# Patient Record
Sex: Female | Born: 2000
Health system: Southern US, Community
[De-identification: ages and names within clinical notes are randomized; demographics above are authoritative.]

## PROBLEM LIST (undated history)

## (undated) ENCOUNTER — Ambulatory Visit: Disposition: A | Discharge status: Home / Self Care | Attending: Physician Assistant | Admitting: Physician Assistant

## (undated) DIAGNOSIS — T7840XA Allergy, unspecified, initial encounter: Secondary | ICD-10-CM

## (undated) DIAGNOSIS — K219 Gastro-esophageal reflux disease without esophagitis: Secondary | ICD-10-CM

## (undated) DIAGNOSIS — F419 Anxiety disorder, unspecified: Secondary | ICD-10-CM

## (undated) DIAGNOSIS — F32A Depression, unspecified: Secondary | ICD-10-CM

## (undated) DIAGNOSIS — R569 Unspecified convulsions: Secondary | ICD-10-CM

## (undated) HISTORY — DX: Anxiety disorder, unspecified: F41.9

## (undated) HISTORY — DX: Gastro-esophageal reflux disease without esophagitis: K21.9

## (undated) HISTORY — DX: Depression, unspecified: F32.A

## (undated) HISTORY — DX: Allergy, unspecified, initial encounter: T78.40XA

---
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
## 2020-01-01 DIAGNOSIS — Z113 Encounter for screening for infections with a predominantly sexual mode of transmission: Secondary | ICD-10-CM | POA: Diagnosis not present

## 2020-01-01 DIAGNOSIS — R300 Dysuria: Secondary | ICD-10-CM | POA: Diagnosis not present

## 2020-01-01 DIAGNOSIS — R102 Pelvic and perineal pain: Secondary | ICD-10-CM | POA: Diagnosis not present

## 2020-01-24 DIAGNOSIS — R300 Dysuria: Secondary | ICD-10-CM | POA: Diagnosis not present

## 2020-01-24 DIAGNOSIS — R102 Pelvic and perineal pain: Secondary | ICD-10-CM | POA: Diagnosis not present

## 2020-02-07 DIAGNOSIS — N390 Urinary tract infection, site not specified: Secondary | ICD-10-CM | POA: Diagnosis not present

## 2020-02-07 DIAGNOSIS — B951 Streptococcus, group B, as the cause of diseases classified elsewhere: Secondary | ICD-10-CM | POA: Diagnosis not present

## 2020-02-15 DIAGNOSIS — F419 Anxiety disorder, unspecified: Secondary | ICD-10-CM | POA: Diagnosis not present

## 2020-02-15 DIAGNOSIS — M6281 Muscle weakness (generalized): Secondary | ICD-10-CM | POA: Diagnosis not present

## 2020-02-15 DIAGNOSIS — R300 Dysuria: Secondary | ICD-10-CM | POA: Diagnosis not present

## 2020-02-15 DIAGNOSIS — R102 Pelvic and perineal pain: Secondary | ICD-10-CM | POA: Diagnosis not present

## 2020-03-06 DIAGNOSIS — R102 Pelvic and perineal pain: Secondary | ICD-10-CM | POA: Diagnosis not present

## 2020-03-06 DIAGNOSIS — M6281 Muscle weakness (generalized): Secondary | ICD-10-CM | POA: Diagnosis not present

## 2020-03-06 DIAGNOSIS — M62838 Other muscle spasm: Secondary | ICD-10-CM | POA: Diagnosis not present

## 2020-03-06 DIAGNOSIS — R300 Dysuria: Secondary | ICD-10-CM | POA: Diagnosis not present

## 2020-03-08 DIAGNOSIS — R300 Dysuria: Secondary | ICD-10-CM | POA: Diagnosis not present

## 2020-03-08 DIAGNOSIS — R102 Pelvic and perineal pain: Secondary | ICD-10-CM | POA: Diagnosis not present

## 2020-03-26 DIAGNOSIS — F321 Major depressive disorder, single episode, moderate: Secondary | ICD-10-CM | POA: Diagnosis not present

## 2020-03-26 DIAGNOSIS — F4011 Social phobia, generalized: Secondary | ICD-10-CM | POA: Diagnosis not present

## 2020-03-27 DIAGNOSIS — R300 Dysuria: Secondary | ICD-10-CM | POA: Diagnosis not present

## 2020-03-27 DIAGNOSIS — M6281 Muscle weakness (generalized): Secondary | ICD-10-CM | POA: Diagnosis not present

## 2020-03-27 DIAGNOSIS — R102 Pelvic and perineal pain: Secondary | ICD-10-CM | POA: Diagnosis not present

## 2020-04-20 ENCOUNTER — Other Ambulatory Visit (INDEPENDENT_AMBULATORY_CARE_PROVIDER_SITE_OTHER): Payer: Self-pay | Admitting: Family

## 2020-04-20 DIAGNOSIS — H60391 Other infective otitis externa, right ear: Secondary | ICD-10-CM | POA: Diagnosis not present

## 2020-04-20 DIAGNOSIS — G40B09 Juvenile myoclonic epilepsy, not intractable, without status epilepticus: Secondary | ICD-10-CM

## 2020-04-26 DIAGNOSIS — H60501 Unspecified acute noninfective otitis externa, right ear: Secondary | ICD-10-CM | POA: Diagnosis not present

## 2020-04-26 DIAGNOSIS — H6121 Impacted cerumen, right ear: Secondary | ICD-10-CM | POA: Diagnosis not present

## 2020-05-01 DIAGNOSIS — R300 Dysuria: Secondary | ICD-10-CM | POA: Diagnosis not present

## 2020-05-01 DIAGNOSIS — R102 Pelvic and perineal pain: Secondary | ICD-10-CM | POA: Diagnosis not present

## 2020-05-01 DIAGNOSIS — M6281 Muscle weakness (generalized): Secondary | ICD-10-CM | POA: Diagnosis not present

## 2020-05-07 DIAGNOSIS — F17210 Nicotine dependence, cigarettes, uncomplicated: Secondary | ICD-10-CM | POA: Diagnosis not present

## 2020-05-07 DIAGNOSIS — Z01419 Encounter for gynecological examination (general) (routine) without abnormal findings: Secondary | ICD-10-CM | POA: Diagnosis not present

## 2020-05-07 DIAGNOSIS — Z6828 Body mass index (BMI) 28.0-28.9, adult: Secondary | ICD-10-CM | POA: Diagnosis not present

## 2020-05-07 DIAGNOSIS — Z113 Encounter for screening for infections with a predominantly sexual mode of transmission: Secondary | ICD-10-CM | POA: Diagnosis not present

## 2020-05-07 DIAGNOSIS — Z309 Encounter for contraceptive management, unspecified: Secondary | ICD-10-CM | POA: Diagnosis not present

## 2020-05-16 ENCOUNTER — Telehealth (INDEPENDENT_AMBULATORY_CARE_PROVIDER_SITE_OTHER): Payer: Self-pay | Admitting: Family

## 2020-05-16 NOTE — Telephone Encounter (Signed)
I left a message for Mom that Alison Brock should get the Covid vaccine and that it will not interfere with her medication. I invited her to call back if she has more questions. TG

## 2020-05-16 NOTE — Telephone Encounter (Signed)
  Who's calling (name and relationship to patient) : Elease Hashimoto ( mom)  [phone]   Provider they see: Elveria Rising  Reason for call: Mom wants advise regarding the covid shot recommendations. She wanted to check with medication and immunity.     PRESCRIPTION REFILL ONLY  Name of prescription:  Pharmacy:

## 2020-05-24 ENCOUNTER — Ambulatory Visit (INDEPENDENT_AMBULATORY_CARE_PROVIDER_SITE_OTHER): Payer: BC Managed Care – PPO | Admitting: Family

## 2020-05-24 ENCOUNTER — Other Ambulatory Visit: Payer: Self-pay

## 2020-05-24 ENCOUNTER — Encounter (INDEPENDENT_AMBULATORY_CARE_PROVIDER_SITE_OTHER): Payer: Self-pay | Admitting: Family

## 2020-05-24 VITALS — BP 120/78 | HR 100 | Ht 63.5 in | Wt 164.0 lb

## 2020-05-24 DIAGNOSIS — R519 Headache, unspecified: Secondary | ICD-10-CM

## 2020-05-24 DIAGNOSIS — G43809 Other migraine, not intractable, without status migrainosus: Secondary | ICD-10-CM

## 2020-05-24 DIAGNOSIS — G40309 Generalized idiopathic epilepsy and epileptic syndromes, not intractable, without status epilepticus: Secondary | ICD-10-CM | POA: Diagnosis not present

## 2020-05-24 DIAGNOSIS — F411 Generalized anxiety disorder: Secondary | ICD-10-CM

## 2020-05-24 DIAGNOSIS — F410 Panic disorder [episodic paroxysmal anxiety] without agoraphobia: Secondary | ICD-10-CM

## 2020-05-24 DIAGNOSIS — G40B09 Juvenile myoclonic epilepsy, not intractable, without status epilepticus: Secondary | ICD-10-CM | POA: Diagnosis not present

## 2020-05-24 MED ORDER — LEVETIRACETAM ER 750 MG PO TB24: ORAL_TABLET | ORAL | 3 refills | Status: DC

## 2020-05-24 MED ORDER — AMITRIPTYLINE HCL 25 MG PO TABS: ORAL_TABLET | ORAL | 3 refills | Status: DC

## 2020-05-24 NOTE — Progress Notes (Signed)
Alison Brock   MRN:  [id number]  09-12-2001   Provider: [name] Zakariah Dejarnette NP-C [facility]  Visit type: Routine visit  Last visit: 12/28/2019  Referral source: Nelda Marseillearey Williams, MD History from: patient, mother, and chcn chart  Brief history:  Copied from previous record: History of generalized seizure disorder based on clinical seizure activity and EEG, as well as headaches, anxiety and panic. She is taking and tolerating LevetiracetamXRfor her seizure disorder and has remained seizure free since March 2020. She is taking and tolerating Amitriptyline for migraine prevention. Alison Brock is seeing Dr [name] for anxiety and panic and is taking Buspar and Lorazepam.  Today's concerns:  Alison Brock reports today that she has remained seizure free since her last visit. She is not interested in tapering off Levetiracetam XR because she needs to be able to drive to go to her job and to school. She has history of anxiety and panic and said that was going well until a couple of weeks ago when her grandfather passed away unexpectedly. She had some panic around that time but has improved. She continues to follow up with her behavioral health provider.   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 She says that the air quality is poor at that location and that the headache resolves when she leaves the environment. She is feeling somewhat poorly today because of her menstrual period.   Alison Brock has moved from her mother's home to an apartment with friends and is very happy there. She has 3 more classes to finish to get her high school diploma and says that she has trouble getting motivated to finish the work. She wants to get a new job and wants to attend college when she finishes high school. Alison Brock admits that she occasionally has a sip or a "shot" of alcohol. Her mother is concerned that the alcohol will interact with her medication  or trigger seizures.   Alison Brock has been otherwise generally healthy since she was last seen. Neither she nor mother have other health concerns for her today other than previously mentioned.  Review of systems: Please see HPI for neurologic and other pertinent review of systems. Otherwise all other systems were reviewed and were negative.  Problem List: Patient Active Problem List   Diagnosis Date Noted  . Pyelonephritis 06/02/2019  . Migraine variants, not intractable 02/09/2019  . Panic disorder 12/25/2018  . Social anxiety disorder 10/06/2018  . Frequent headaches 07/08/2016  . Anxiety state 06/26/2016  . Nonintractable juvenile myoclonic epilepsy without status epilepticus ([facility]) 06/26/2016  . Generalized seizure disorder ([facility]) 06/26/2016  . AP (abdominal pain) 06/02/2016  . Loss of weight 06/02/2016  . Constipation 06/02/2016     Past Medical History:  Diagnosis Date  . Anxiety   . Seizures ([facility])     Past medical history comments: See HPI  Surgical history: Past Surgical History:  Procedure Laterality Date  . GUM SURGERY  03/2016     Family history: family history includes Anxiety disorder in her maternal aunt; Bipolar disorder in her maternal aunt; Depression in her paternal grandfather; Hypertension in her maternal grandmother, mother, and paternal grandfather.   Social history: Social History   Socioeconomic History  . Marital status: Single    Spouse name: Not on file  . Number of children: Not on file  . Years of education: Not on file  . Highest education level: Not on file  Occupational History  .  Not on file  Tobacco Use  . Smoking status: Passive Smoke Exposure - Never Smoker  . Smokeless tobacco: Never Used  Vaping Use  . Vaping Use: Never used  Substance and Sexual Activity  . Alcohol use: No  . Drug use: No  . Sexual activity: Never  Other Topics Concern  . Not on file  Social History Narrative   Alison Brock is a 12th grade student.   She  attends online school through [employer]. She does well in school.   Lives with her mother.   Social Determinants of Health   Financial Resource Strain: Unknown  . Difficulty of Paying Living Expenses: Patient refused  Food Insecurity: Unknown  . Worried About Programme researcher, broadcasting/film/video in the Last Year: Patient refused  . Ran Out of Food in the Last Year: Patient refused  Transportation Needs: Unknown  . Lack of Transportation (Medical): Patient refused  . Lack of Transportation (Non-Medical): Patient refused  Physical Activity: Unknown  . Days of Exercise per Week: Patient refused  . Minutes of Exercise per Session: Patient refused  Stress: Unknown  . Feeling of Stress : Patient refused  Social Connections: Unknown  . Frequency of Communication with Friends and Family: Patient refused  . Frequency of Social Gatherings with Friends and Family: Patient refused  . Attends Religious Services: Patient refused  . Active Member of Clubs or Organizations: Patient refused  . Attends Banker Meetings: Patient refused  . Marital Status: Patient refused  Intimate Partner Violence: Unknown  . Fear of Current or Ex-Partner: Patient refused  . Emotionally Abused: Patient refused  . Physically Abused: Patient refused  . Sexually Abused: Patient refused    Past/failed meds: Topiramate ER - worsened anxiety and panic  Allergies: Allergies  Allergen Reactions  . Bentyl [Dicyclomine Hcl]   . Doxycycline Other (See Comments)  . Grapefruit Extract Other (See Comments)    Pt is not supposed to eat grapefruit with medication   . Other     Seasonal Allergies - Spring and Fall  . Topiramate Er     Immunizations:  There is no immunization history on file for this patient.   Diagnostics/Screenings: Copied from previous record: EEG 04/29/18 -This EEG isabnormal due to episodes of generalized discharges, more frontally predominant throughout the recording. The findings consistent  withgeneralized seizure disorder, possibly juvenile myoclonic epilepsy, associated with lower seizure threshold and require careful clinical correlation  Physical Exam: BP 120/78   Pulse 100   Ht 5' 3.5" (1.613 m)   Wt 164 lb (74.4 kg)   BMI 28.60 kg/m   General: Well developed, well nourished young woman, seated on exam table, in no evident distress, dyed blonde/brown hair, brown eyes, right handed Head: Head normocephalic and atraumatic.  Oropharynx benign. Neck: Supple with no carotid bruits Cardiovascular: Regular rate and rhythm, no murmurs Respiratory: Breath sounds clear to auscultation Musculoskeletal: No obvious deformities or scoliosis Skin: No rashes or neurocutaneous lesions  Neurologic Exam Mental Status: Awake and fully alert.  Oriented to place and time.  Recent and remote memory intact.  Attention span, concentration, and fund of knowledge appropriate.  Mood and affect appropriate. Cranial Nerves: Fundoscopic exam reveals sharp disc margins.  Pupils equal, briskly reactive to light.  Extraocular movements full without nystagmus. Hearing intact and symmetric to whisper.  Facial sensation intact.  Face tongue, palate move normally and symmetrically.  Neck flexion and extension normal. Motor: Normal bulk and tone. Normal strength in all tested extremity muscles.  Sensory: Intact to touch and temperature in all extremities.  Coordination: Rapid alternating movements normal in all extremities.  Finger-to-nose and heel-to shin performed accurately bilaterally.  Romberg negative. Gait and Station: Arises from chair without difficulty.  Stance is normal. Gait demonstrates normal stride length and balance.   Able to heel, toe and tandem walk without difficulty. Reflexes: 1+ and symmetric. Toes downgoing.  Impression: 1. Generalized seizure disorder 2. Anxiety and panic disorder 3. Migraine variant 4. Tension headaches  Recommendations for plan of care: The patient's previous  [facility] records were reviewed. Alison Needle has neither had nor required imaging or lab studies since the last visit. She is an 19 year old girl with history of generalized seizure disorder, anxiety and panic disorder, migraine and tension headaches. She is taking and tolerating Levetiracetam XR and has remained seizure free since March 2020. She is not interested in performing an EEG to determine if she can safely taper off medication as she needs to be able to drive. Alison Needle is being followed by [facility] for anxiety and panic. She also has migraine and tension headaches but they have not been problematic at this time. Alison Brock asked about drinking alcohol and I explained to her that at 19 years old, she is underage for alcohol in [place]. I also told her that an occasional sip would not likely cause problems but that regular consumption of alcohol is known to reduce effectiveness of anti-epileptic medication and could trigger seizures. I encouraged Alison Brock to finish the courses needed so that she can get a high school diploma and be able to attend college. I will see her back in follow up in 6 months or sooner if needed. Alison Brock agreed with the plans made today.   The medication list was reviewed and reconciled. No changes were made in the prescribed medications today. A complete medication list was provided to the patient.  Allergies as of 05/24/2020      Reactions   Bentyl [dicyclomine Hcl]    Doxycycline Other (See Comments)   Grapefruit Extract Other (See Comments)   Pt is not supposed to eat grapefruit with medication    Other    Seasonal Allergies - Spring and Fall   Topiramate Er       Medication List       Accurate as of May 24, 2020 11:59 PM. If you have any questions, ask your nurse or doctor.        STOP taking these medications   acetaminophen 500 MG tablet Commonly known as: TYLENOL Stopped by: Elveria Rising, NP   Adapalene 0.3 % gel Stopped by: Elveria Rising, NP     amoxicillin 875 MG tablet Commonly known as: AMOXIL Stopped by: Elveria Rising, NP   clindamycin-benzoyl peroxide gel Commonly known as: BENZACLIN Stopped by: Elveria Rising, NP   ondansetron 4 MG tablet Commonly known as: ZOFRAN Stopped by: Elveria Rising, NP     TAKE these medications   amitriptyline 25 MG tablet Commonly known as: ELAVIL TAKE TWO TABLETS BY MOUTH EVERY NIGHT AT BEDTIME   busPIRone 15 MG tablet Commonly known as: BUSPAR Take 15 mg by mouth 2 (two) times daily.   busPIRone 30 MG tablet Commonly known as: BUSPAR Take by mouth.   ibuprofen 200 MG tablet Commonly known as: ADVIL Take 400 mg by mouth every 6 (six) hours as needed for headache or moderate pain.   Jencycla 0.35 MG tablet Generic drug: norethindrone Take 0.35 mg by mouth daily. AM   Levetiracetam  750 MG Tb24 TAKE TWO TABLETS BY MOUTH EVERY NIGHT AT BEDTIME   LORazepam 0.5 MG tablet Commonly known as: ATIVAN TAKE 1/2 TO 1 (ONE-HALF TO ONE) TABLET BY MOUTH ONCE DAILY AS NEEDED FOR ANXIETY What changed: See the new instructions.   Magnesium Oxide 500 MG Tabs Take 500 mg by mouth daily.   pyridOXINE 100 MG tablet Commonly known as: VITAMIN B-6 Take 100 mg by mouth daily.   riboflavin 100 MG Tabs tablet Commonly known as: VITAMIN B-2 Take 100 mg by mouth daily.   vitamin C 500 MG tablet Commonly known as: ASCORBIC ACID Take 500 mg by mouth 2 (two) times daily.      Total time spent with the patient was 30 minutes, of which 50% or more was spent in counseling and coordination of care.  Elveria Rising NP-C [facility]rology Ph. [phone] Fax [phone]

## 2020-05-25 ENCOUNTER — Encounter (INDEPENDENT_AMBULATORY_CARE_PROVIDER_SITE_OTHER): Payer: Self-pay | Admitting: Family

## 2020-05-25 NOTE — Patient Instructions (Signed)
Thank you for coming in today.   Instructions for you until your next appointment are as follows: 1. Continue taking the Levetiracetam XR as prescribed. Try not to miss any doses. Let me know if you have any seizures.  2. Remember that alcohol can reduce the effectiveness of seizure medicines and could trigger seizures as we discussed today 3. Continue seeing Dr Jannifer Franklin for anxiety and panic 4. Remember that it is important for you to not skip meals, to drink plenty of water each day and to get at least 8 hours of sleep each night as these things are known to help to reduce the frequency of headaches 5. Work on finishing up the courses you need to get your high school diploma so that you can apply to college :) 6. Please sign up for MyChart if you have not done so 7. Please plan to return for follow up in 6 months or sooner if needed. 8. I have given you a note for missing work today.

## 2020-05-26 DIAGNOSIS — H6691 Otitis media, unspecified, right ear: Secondary | ICD-10-CM | POA: Diagnosis not present

## 2020-05-26 DIAGNOSIS — H7291 Unspecified perforation of tympanic membrane, right ear: Secondary | ICD-10-CM | POA: Diagnosis not present

## 2020-05-30 DIAGNOSIS — R102 Pelvic and perineal pain: Secondary | ICD-10-CM | POA: Diagnosis not present

## 2020-05-30 DIAGNOSIS — M62838 Other muscle spasm: Secondary | ICD-10-CM | POA: Diagnosis not present

## 2020-05-30 DIAGNOSIS — R300 Dysuria: Secondary | ICD-10-CM | POA: Diagnosis not present

## 2020-06-11 DIAGNOSIS — Z8669 Personal history of other diseases of the nervous system and sense organs: Secondary | ICD-10-CM | POA: Diagnosis not present

## 2020-06-21 DIAGNOSIS — F321 Major depressive disorder, single episode, moderate: Secondary | ICD-10-CM | POA: Diagnosis not present

## 2020-06-21 DIAGNOSIS — F4011 Social phobia, generalized: Secondary | ICD-10-CM | POA: Diagnosis not present

## 2020-07-04 ENCOUNTER — Ambulatory Visit (INDEPENDENT_AMBULATORY_CARE_PROVIDER_SITE_OTHER): Payer: BC Managed Care – PPO | Admitting: Family

## 2020-09-04 ENCOUNTER — Telehealth (INDEPENDENT_AMBULATORY_CARE_PROVIDER_SITE_OTHER): Payer: Self-pay | Admitting: Family

## 2020-09-04 DIAGNOSIS — R6884 Jaw pain: Secondary | ICD-10-CM | POA: Diagnosis not present

## 2020-09-04 DIAGNOSIS — M542 Cervicalgia: Secondary | ICD-10-CM | POA: Diagnosis not present

## 2020-09-04 DIAGNOSIS — R519 Headache, unspecified: Secondary | ICD-10-CM | POA: Diagnosis not present
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
## 2020-09-15 DIAGNOSIS — M25511 Pain in right shoulder: Secondary | ICD-10-CM | POA: Diagnosis not present

## 2020-09-15 DIAGNOSIS — M542 Cervicalgia: Secondary | ICD-10-CM | POA: Diagnosis not present

## 2020-09-15 DIAGNOSIS — M545 Low back pain, unspecified: Secondary | ICD-10-CM | POA: Diagnosis not present

## 2020-09-25 DIAGNOSIS — J029 Acute pharyngitis, unspecified: Secondary | ICD-10-CM | POA: Diagnosis not present

## 2020-09-25 DIAGNOSIS — F321 Major depressive disorder, single episode, moderate: Secondary | ICD-10-CM | POA: Diagnosis not present

## 2020-09-25 DIAGNOSIS — F4011 Social phobia, generalized: Secondary | ICD-10-CM | POA: Diagnosis not present

## 2020-10-03 DIAGNOSIS — M6281 Muscle weakness (generalized): Secondary | ICD-10-CM | POA: Diagnosis not present

## 2020-10-03 DIAGNOSIS — S39012D Strain of muscle, fascia and tendon of lower back, subsequent encounter: Secondary | ICD-10-CM | POA: Diagnosis not present

## 2020-10-03 DIAGNOSIS — S161XXD Strain of muscle, fascia and tendon at neck level, subsequent encounter: Secondary | ICD-10-CM | POA: Diagnosis not present

## 2020-10-08 DIAGNOSIS — M25511 Pain in right shoulder: Secondary | ICD-10-CM | POA: Diagnosis not present

## 2020-10-08 DIAGNOSIS — M545 Low back pain, unspecified: Secondary | ICD-10-CM | POA: Diagnosis not present

## 2020-10-15 DIAGNOSIS — S39012D Strain of muscle, fascia and tendon of lower back, subsequent encounter: Secondary | ICD-10-CM | POA: Diagnosis not present

## 2020-10-15 DIAGNOSIS — S161XXD Strain of muscle, fascia and tendon at neck level, subsequent encounter: Secondary | ICD-10-CM | POA: Diagnosis not present

## 2020-10-15 DIAGNOSIS — M6281 Muscle weakness (generalized): Secondary | ICD-10-CM | POA: Diagnosis not present

## 2020-11-14 ENCOUNTER — Ambulatory Visit (INDEPENDENT_AMBULATORY_CARE_PROVIDER_SITE_OTHER): Payer: BC Managed Care – PPO | Admitting: Family

## 2020-11-14 ENCOUNTER — Other Ambulatory Visit: Payer: Self-pay

## 2020-11-14 ENCOUNTER — Encounter (INDEPENDENT_AMBULATORY_CARE_PROVIDER_SITE_OTHER): Payer: Self-pay | Admitting: Family

## 2020-11-14 VITALS — BP 116/76 | HR 104 | Ht 64.0 in | Wt 174.6 lb

## 2020-11-14 DIAGNOSIS — F410 Panic disorder [episodic paroxysmal anxiety] without agoraphobia: Secondary | ICD-10-CM

## 2020-11-14 DIAGNOSIS — G40B09 Juvenile myoclonic epilepsy, not intractable, without status epilepticus: Secondary | ICD-10-CM

## 2020-11-14 DIAGNOSIS — Z79899 Other long term (current) drug therapy: Secondary | ICD-10-CM

## 2020-11-14 DIAGNOSIS — G40309 Generalized idiopathic epilepsy and epileptic syndromes, not intractable, without status epilepticus: Secondary | ICD-10-CM

## 2020-11-14 DIAGNOSIS — F4010 Social phobia, unspecified: Secondary | ICD-10-CM
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
## 2020-11-15 DIAGNOSIS — Z79899 Other long term (current) drug therapy: Secondary | ICD-10-CM | POA: Diagnosis not present

## 2020-11-15 DIAGNOSIS — G40B09 Juvenile myoclonic epilepsy, not intractable, without status epilepticus: Secondary | ICD-10-CM | POA: Diagnosis not present

## 2020-11-18 ENCOUNTER — Encounter (INDEPENDENT_AMBULATORY_CARE_PROVIDER_SITE_OTHER): Payer: Self-pay | Admitting: Family

## 2020-11-18 DIAGNOSIS — Z79899 Other long term (current) drug therapy: Secondary | ICD-10-CM | POA: Insufficient documentation

## 2020-11-18 NOTE — Patient Instructions (Addendum)
Thank you for coming in today.   Instructions for you until your next appointment are as follows: 1. Please return to the office tomorrow to have a Levetiracetam level drawn. I will call you when I receive the results. Your are scheduled for an EEG next Friday - we can discuss whether or not that needs to be done when I call you about the blood test report.  2. Talk to your therapist about drinking alcohol and the things we discussed today. Remember that you should not be drinking any alcohol as it will interfere with the effectiveness of your seizure medicine.  3. Also talk with your therapist about your anxiety about driving and about your lack of motivation to return to school.  4. Talk with your psychiatrist about the depression and lack of motivation that we discussed today.  5. Remember that alcohol is dangerous to a developing fetus. Be sure to use effective birth control, especially while you frequently drinking alcohol and vaping.  6. You have gained some weight since your last visit. Try to work on consuming healthy foods and getting some exercise each day.  7. Please plan to return for follow up in 3 months or sooner if needed.

## 2020-11-20 ENCOUNTER — Telehealth (INDEPENDENT_AMBULATORY_CARE_PROVIDER_SITE_OTHER): Payer: Self-pay | Admitting: Family

## 2020-11-20 DIAGNOSIS — G40B09 Juvenile myoclonic epilepsy, not intractable, without status epilepticus: Secondary | ICD-10-CM

## 2020-11-20 DIAGNOSIS — Z79899 Other long term (current) drug therapy: Secondary | ICD-10-CM

## 2020-11-20 LAB — LEVETIRACETAM LEVEL: Keppra (Levetiracetam): 7.2 ug/mL — ABNORMAL LOW (ref 12.0–46.0)

## 2020-11-20 NOTE — Telephone Encounter (Signed)
Patient returned call and requests call back at [phone].

## 2020-11-20 NOTE — Telephone Encounter (Signed)
I left a message asking [name] to return my call. TG
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
## 2020-11-28 ENCOUNTER — Ambulatory Visit (INDEPENDENT_AMBULATORY_CARE_PROVIDER_SITE_OTHER): Payer: BC Managed Care – PPO | Admitting: Family

## 2020-11-29 DIAGNOSIS — Z79899 Other long term (current) drug therapy: Secondary | ICD-10-CM | POA: Diagnosis not present

## 2020-11-29 DIAGNOSIS — G40B09 Juvenile myoclonic epilepsy, not intractable, without status epilepticus: Secondary | ICD-10-CM | POA: Diagnosis not present

## 2020-11-30 ENCOUNTER — Other Ambulatory Visit (INDEPENDENT_AMBULATORY_CARE_PROVIDER_SITE_OTHER): Payer: BC Managed Care – PPO

## 2020-12-04 LAB — LEVETIRACETAM LEVEL: Keppra (Levetiracetam): 13.7 ug/mL (ref 12.0–46.0)

## 2020-12-07 DIAGNOSIS — F321 Major depressive disorder, single episode, moderate: Secondary | ICD-10-CM | POA: Diagnosis not present

## 2020-12-07 DIAGNOSIS — F4011 Social phobia, generalized: Secondary | ICD-10-CM | POA: Diagnosis not present

## 2021-01-04 DIAGNOSIS — L723 Sebaceous cyst: Secondary | ICD-10-CM | POA: Diagnosis not present

## 2021-02-21 ENCOUNTER — Encounter (INDEPENDENT_AMBULATORY_CARE_PROVIDER_SITE_OTHER): Payer: Self-pay

## 2021-03-15 DIAGNOSIS — F4011 Social phobia, generalized: Secondary | ICD-10-CM | POA: Diagnosis not present

## 2021-03-15 DIAGNOSIS — F321 Major depressive disorder, single episode, moderate: Secondary | ICD-10-CM | POA: Diagnosis not present

## 2021-03-19 DIAGNOSIS — Z309 Encounter for contraceptive management, unspecified: Secondary | ICD-10-CM | POA: Diagnosis not present

## 2021-03-19 DIAGNOSIS — R309 Painful micturition, unspecified: Secondary | ICD-10-CM | POA: Diagnosis not present

## 2021-03-19 DIAGNOSIS — Z113 Encounter for screening for infections with a predominantly sexual mode of transmission: Secondary | ICD-10-CM | POA: Diagnosis not present

## 2021-03-19 DIAGNOSIS — N921 Excessive and frequent menstruation with irregular cycle: Secondary | ICD-10-CM | POA: Diagnosis not present

## 2021-03-19 DIAGNOSIS — F17210 Nicotine dependence, cigarettes, uncomplicated: Secondary | ICD-10-CM | POA: Diagnosis not present

## 2021-04-15 ENCOUNTER — Telehealth (INDEPENDENT_AMBULATORY_CARE_PROVIDER_SITE_OTHER): Payer: Self-pay | Admitting: Family

## 2021-04-15 DIAGNOSIS — G40B09 Juvenile myoclonic epilepsy, not intractable, without status epilepticus: Secondary | ICD-10-CM

## 2021-04-15 DIAGNOSIS — F411 Generalized anxiety disorder: Secondary | ICD-10-CM

## 2021-04-15 DIAGNOSIS — F410 Panic disorder [episodic paroxysmal anxiety] without agoraphobia: Secondary | ICD-10-CM

## 2021-04-15 NOTE — Telephone Encounter (Signed)
Who's calling (name and relationship to patient) : Johny Shears mom   Best contact number: [phone]  Provider they see: Elveria Rising  Reason for call: Mom thinks an EEG may be needed. But she wants to speak with Inetta Fermo or someone from clinic before making an appt. Please call when possible.   Call ID:      PRESCRIPTION REFILL ONLY  Name of prescription:  Pharmacy:

## 2021-04-15 NOTE — Telephone Encounter (Signed)
I called and talked with Mom. She said that Alison Brock was having more anxiety and sometimes felt that she might pass out. Mom wondered if she was having seizure activity and wanted an EEG. I told Mom that I am happy to schedule an EEG but that it will not be helpful for anxiety and panic. I recommended close follow up with psychiatrist and therapist. Mom agreed to wait on EEG for now. TG

## 2021-04-23 NOTE — Addendum Note (Signed)
Addended by: Princella Ion on: 04/23/2021 10:39 AM   Modules accepted: Orders

## 2021-04-23 NOTE — Telephone Encounter (Signed)
Mom contacted me to report that Alison Brock had another episode of shaking and that she wants to proceed with an EEG. I scheduled her for EEG tomorrow at 12n. I will call Alison Brock and her mother when the results are available to me. TG

## 2021-04-24 ENCOUNTER — Other Ambulatory Visit: Payer: Self-pay

## 2021-04-24 ENCOUNTER — Ambulatory Visit (INDEPENDENT_AMBULATORY_CARE_PROVIDER_SITE_OTHER): Payer: BC Managed Care – PPO | Admitting: Neurology

## 2021-04-24 DIAGNOSIS — F411 Generalized anxiety disorder: Secondary | ICD-10-CM

## 2021-04-24 DIAGNOSIS — G40B09 Juvenile myoclonic epilepsy, not intractable, without status epilepticus: Secondary | ICD-10-CM | POA: Diagnosis not present

## 2021-04-24 DIAGNOSIS — F410 Panic disorder [episodic paroxysmal anxiety] without agoraphobia: Secondary | ICD-10-CM

## 2021-04-24 NOTE — Progress Notes (Signed)
OP EEG completed at [facility] office, results pending. 

## 2021-04-24 NOTE — Procedures (Signed)
Patient:  Alison Brock   Sex: female  DOB:  04-16-2001  Date of study:    04/24/2021             Clinical history: This is a 20 year old female with diagnosis of generalized seizure disorder since September 2017.  This is a follow-up EEG for evaluation of epileptiform discharges.  No recent clinical seizure activity over the past couple of years but has been having some anxiety concerning for seizure.    Medication: Keppra, amitriptyline, BuSpar              Procedure: The tracing was carried out on a 32 channel digital Cadwell recorder reformatted into 16 channel montages with 1 devoted to EKG.  The 10 /20 international system electrode placement was used. Recording was done during awake, drowsiness and sleep states. Recording time 32 minutes.   Description of findings: Background rhythm consists of amplitude of 40 microvolt and frequency of   9-10 hertz posterior dominant rhythm. There was normal anterior posterior gradient noted. Background was well organized, continuous and symmetric with no focal slowing. There was muscle artifact noted. Hyperventilation resulted in slowing of the background activity. Photic stimulation was not performed. Throughout the recording there were no focal or generalized epileptiform activities in the form of spikes or sharps noted except for 3 or 4 single small spikes in bilateral frontal area. There were no transient rhythmic activities or electrographic seizures noted. One lead EKG rhythm strip revealed sinus rhythm at a rate of 80 bpm.  Impression: This EEG is unremarkable during awake state.  There were just a couple of single sharps in the bilateral frontal area which is not significant.  This is significant improvement compared to her initial EEGs with frequent generalized discharges. Please note that normal EEG does not exclude epilepsy, clinical correlation is indicated.      Keturah Shavers, MD

## 2021-04-25 DIAGNOSIS — F419 Anxiety disorder, unspecified: Secondary | ICD-10-CM | POA: Diagnosis not present

## 2021-04-25 DIAGNOSIS — R0982 Postnasal drip: Secondary | ICD-10-CM | POA: Diagnosis not present

## 2021-04-25 DIAGNOSIS — R059 Cough, unspecified: Secondary | ICD-10-CM | POA: Diagnosis not present

## 2021-05-01 DIAGNOSIS — F321 Major depressive disorder, single episode, moderate: Secondary | ICD-10-CM | POA: Diagnosis not present

## 2021-05-01 DIAGNOSIS — F4011 Social phobia, generalized: Secondary | ICD-10-CM | POA: Diagnosis not present

## 2021-05-15 DIAGNOSIS — U071 COVID-19: Secondary | ICD-10-CM | POA: Diagnosis not present

## 2021-05-15 DIAGNOSIS — R051 Acute cough: Secondary | ICD-10-CM | POA: Diagnosis not present

## 2021-05-23 DIAGNOSIS — H6122 Impacted cerumen, left ear: Secondary | ICD-10-CM | POA: Diagnosis not present

## 2021-05-23 DIAGNOSIS — H8112 Benign paroxysmal vertigo, left ear: Secondary | ICD-10-CM | POA: Diagnosis not present

## 2021-06-26 ENCOUNTER — Telehealth (INDEPENDENT_AMBULATORY_CARE_PROVIDER_SITE_OTHER): Payer: Self-pay | Admitting: Family

## 2021-06-26 DIAGNOSIS — R519 Headache, unspecified: Secondary | ICD-10-CM

## 2021-06-26 DIAGNOSIS — G40B09 Juvenile myoclonic epilepsy, not intractable, without status epilepticus: Secondary | ICD-10-CM

## 2021-06-28 ENCOUNTER — Other Ambulatory Visit (INDEPENDENT_AMBULATORY_CARE_PROVIDER_SITE_OTHER): Payer: Self-pay | Admitting: Family

## 2021-06-28 DIAGNOSIS — R519 Headache, unspecified: Secondary | ICD-10-CM

## 2021-06-28 DIAGNOSIS — G40B09 Juvenile myoclonic epilepsy, not intractable, without status epilepticus: Secondary | ICD-10-CM

## 2021-06-28 MED ORDER — LEVETIRACETAM ER 750 MG PO TB24: ORAL_TABLET | ORAL | 0 refills | Status: DC

## 2021-06-28 MED ORDER — AMITRIPTYLINE HCL 25 MG PO TABS: ORAL_TABLET | ORAL | 0 refills | Status: DC

## 2021-07-02 DIAGNOSIS — B369 Superficial mycosis, unspecified: Secondary | ICD-10-CM | POA: Diagnosis not present

## 2021-07-02 DIAGNOSIS — D649 Anemia, unspecified: Secondary | ICD-10-CM | POA: Diagnosis not present

## 2021-07-02 DIAGNOSIS — Z6834 Body mass index (BMI) 34.0-34.9, adult: Secondary | ICD-10-CM | POA: Diagnosis not present

## 2021-07-02 DIAGNOSIS — Z113 Encounter for screening for infections with a predominantly sexual mode of transmission: Secondary | ICD-10-CM | POA: Diagnosis not present

## 2021-07-02 DIAGNOSIS — Z309 Encounter for contraceptive management, unspecified: Secondary | ICD-10-CM | POA: Diagnosis not present

## 2021-07-02 DIAGNOSIS — R309 Painful micturition, unspecified: Secondary | ICD-10-CM | POA: Diagnosis not present

## 2021-07-02 DIAGNOSIS — Z01419 Encounter for gynecological examination (general) (routine) without abnormal findings: Secondary | ICD-10-CM | POA: Diagnosis not present

## 2021-07-28 DIAGNOSIS — J029 Acute pharyngitis, unspecified: Secondary | ICD-10-CM | POA: Diagnosis not present

## 2021-07-28 DIAGNOSIS — J019 Acute sinusitis, unspecified: Secondary | ICD-10-CM | POA: Diagnosis not present

## 2021-07-28 DIAGNOSIS — R059 Cough, unspecified: Secondary | ICD-10-CM | POA: Diagnosis not present

## 2021-07-28 DIAGNOSIS — R519 Headache, unspecified: Secondary | ICD-10-CM | POA: Diagnosis not present

## 2021-07-30 DIAGNOSIS — F4011 Social phobia, generalized: Secondary | ICD-10-CM | POA: Diagnosis not present

## 2021-07-30 DIAGNOSIS — F321 Major depressive disorder, single episode, moderate: Secondary | ICD-10-CM | POA: Diagnosis not present

## 2021-08-01 ENCOUNTER — Other Ambulatory Visit: Payer: Self-pay

## 2021-08-01 ENCOUNTER — Encounter: Payer: Self-pay | Admitting: Nurse Practitioner

## 2021-08-01 ENCOUNTER — Ambulatory Visit (INDEPENDENT_AMBULATORY_CARE_PROVIDER_SITE_OTHER): Payer: BC Managed Care – PPO | Admitting: Nurse Practitioner

## 2021-08-01 VITALS — BP 120/70 | HR 100 | Temp 97.3°F | Ht 64.0 in | Wt 205.8 lb

## 2021-08-01 DIAGNOSIS — J069 Acute upper respiratory infection, unspecified: Secondary | ICD-10-CM | POA: Diagnosis not present

## 2021-08-01 DIAGNOSIS — J039 Acute tonsillitis, unspecified: Secondary | ICD-10-CM

## 2021-08-01 NOTE — Patient Instructions (Addendum)
Complete amoxicillin and oral steroid as prescribed. Ok to continue zyrtec 1tab daily Ok to use mucinex DM or robitussin or delsym for cough if needed  For ear irrigation, you can use luke warm water and peroxide to irrigation ears Or liquid colace mixed in luke warm water for irrigation.  Thank you for choosing [facility].

## 2021-08-01 NOTE — Progress Notes (Signed)
Subjective:  Patient ID: Alison Brock, female    DOB: 07/30/2001  Age: 20 y.o. MRN: [id number]  CC: Establish Care (New patient/Pt c/o cough, congestion, and fatigue x 3 weeks. Pt states she went to urgent care Sunday and was tested negative for COVID, Flu, and Strep. )  URI  This is a new problem. The current episode started 1 to 4 weeks ago. The problem has been gradually improving. There has been no fever. Associated symptoms include congestion, coughing, ear pain, headaches, rhinorrhea, sinus pain, a sore throat and swollen glands. Pertinent negatives include no abdominal pain, chest pain, diarrhea, dysuria, joint pain, joint swelling, nausea, neck pain, plugged ear sensation, rash, sneezing, vomiting or wheezing. She has tried increased fluids and antihistamine (mucinex) for the symptoms. The treatment provided mild relief.  Accompanied by her mother. Evaluated by urgent care provided on Sunday, amoxicillin and oral prednisone prescribed.  Reviewed past Medical, Social and Family history today.  Outpatient Medications Prior to Visit  Medication Sig Dispense Refill   amitriptyline (ELAVIL) 25 MG tablet TAKE TWO TABLETS BY MOUTH EVERY NIGHT AT BEDTIME 180 tablet 0   busPIRone (BUSPAR) 15 MG tablet Take 15 mg by mouth 2 (two) times daily.      Levetiracetam 750 MG TB24 TAKE TWO TABLETS BY MOUTH EVERY NIGHT AT BEDTIME 180 tablet 0   LORazepam (ATIVAN) 0.5 MG tablet TAKE 1/2 TO 1 (ONE-HALF TO ONE) TABLET BY MOUTH ONCE DAILY AS NEEDED FOR ANXIETY (Patient taking differently: Take 0.25-0.5 mg by mouth daily as needed for anxiety. TAKE 1/2 TO 1 (ONE-HALF TO ONE) TABLET BY MOUTH ONCE DAILY AS NEEDED FOR ANXIETY) 30 tablet 0   Magnesium Oxide 500 MG TABS Take 500 mg by mouth daily.      norethindrone (MICRONOR) 0.35 MG tablet Take 0.35 mg by mouth daily. AM     pyridOXINE (VITAMIN B-6) 100 MG tablet Take 100 mg by mouth daily.     riboflavin (VITAMIN B-2) 100 MG TABS tablet Take 100 mg by  mouth daily.     ibuprofen (ADVIL) 200 MG tablet Take 400 mg by mouth every 6 (six) hours as needed for headache or moderate pain. (Patient not taking: Reported on 11/14/2020)     Pyridoxine HCl (VITAMIN B6 PO) Vitamin B6  1 TABLET PO DAILY     riboflavin (VITAMIN B-2) 100 MG TABS tablet riboflavin (vitamin B2)  1 TABLET PO DAILY     No facility-administered medications prior to visit.    ROS See HPI  Objective:  BP 120/70 (BP Location: Left Arm, Patient Position: Sitting, Cuff Size: Large)   Pulse 100   Temp (!) 97.3 F (36.3 C) (Temporal)   Ht 5\' 4"  (1.626 m)   Wt 205 lb 12.8 oz (93.4 kg)   SpO2 100%   BMI 35.33 kg/m   Physical Exam Vitals reviewed.  Constitutional:      General: She is not in acute distress. HENT:     Head:     Jaw: There is normal jaw occlusion.     Salivary Glands: Right salivary gland is not diffusely enlarged or tender. Left salivary gland is not diffusely enlarged or tender.     Right Ear: Hearing normal. There is impacted cerumen.     Left Ear: Hearing normal. There is impacted cerumen.     Nose: Nose normal.     Mouth/Throat:     Mouth: Mucous membranes are moist.     Pharynx: Posterior oropharyngeal erythema present. No oropharyngeal  exudate or uvula swelling.     Tonsils: No tonsillar exudate. 2+ on the right. 2+ on the left.  Cardiovascular:     Rate and Rhythm: Normal rate and regular rhythm.     Pulses: Normal pulses.     Heart sounds: Normal heart sounds.  Pulmonary:     Effort: Pulmonary effort is normal.     Breath sounds: Normal breath sounds.  Musculoskeletal:     Cervical back: Normal range of motion and neck supple.  Lymphadenopathy:     Cervical: Cervical adenopathy present.  Neurological:     Mental Status: She is alert and oriented to person, place, and time.  Psychiatric:        Mood and Affect: Mood normal.        Behavior: Behavior normal.   Assessment & Plan:  This visit occurred during the SARS-CoV-2 public health  emergency.  Safety protocols were in place, including screening questions prior to the visit, additional usage of staff PPE, and extensive cleaning of exam room while observing appropriate contact time as indicated for disinfecting solutions.   Alison Brock was seen today for establish care.  Diagnoses and all orders for this visit:  Upper respiratory tract infection, unspecified type  Tonsillitis Complete amoxicillin and oral steroid as prescribed. Ok to continue zyrtec 1tab daily Ok to use mucinex DM or robitussin or delsym for cough if needed  For ear irrigation, you can use luke warm water and peroxide to irrigation ears Or liquid colace mixed in luke warm water for irrigation.  Problem List Items Addressed This Visit   None Visit Diagnoses     Upper respiratory tract infection, unspecified type    -  Primary   Tonsillitis           Follow-up: Return in about 4 weeks (around 08/29/2021) for CPE (fasting).  Alison Penna, NP

## 2021-08-05 ENCOUNTER — Ambulatory Visit: Payer: Self-pay | Admitting: Nurse Practitioner

## 2021-08-25 ENCOUNTER — Ambulatory Visit (HOSPITAL_COMMUNITY)
Admission: EM | Admit: 2021-08-25 | Discharge: 2021-08-25 | Disposition: A | Discharge status: Home / Self Care | Payer: BC Managed Care – PPO

## 2021-08-25 DIAGNOSIS — F32A Depression, unspecified: Secondary | ICD-10-CM

## 2021-08-25 NOTE — ED Provider Notes (Addendum)
[facility]al [facility]gent [facility] Medical Screening Exam  Patient Name: Alison Brock MRN: [id number] Date of Evaluation: 08/26/21 Chief Complaint:   Diagnosis:  Final diagnoses:  None    History of Present illness: Alison Brock is a 20 y.o. female with psychiatric histroy of generalized anxiety disorder. Patient presented voluntarily to [facility]; patient is accompanied by her mother, [name] [phone]). Patient consented to her mother's participation in assessment. Patient presented with a chief complaint of "I feel really depressed."  Patient was seen face-to-face and her chart was reviewed by this provider.  Patient is alert and oriented x4 patient is calm and cooperative.  Patient is speaking in a normal voice at moderate rate with good eye contact. Patient report has mood as depressed with congruent affect.  Patient denies medical complaint at this time  Patient reports that she has been feeling "really depressed" over the past several weeks.  She endorses depressive symptoms of hopelessness, worthlessness, poor appetite, isolation, guilt, tiredness, crying spells, irritability, excessive worrying, restlessness, and lack of motivation.  She reports that she currently has outpatient therapy as well as medication management with Dr. Jannifer Franklin.  Patient is currently on BuSpar 15 mg 3 times daily and Ativan 0.5mg /day.   she endorses occasional passive suicidal ideations. Patient states "I don't want to be dead but I don't want to be alive." She denies plan or intent to commit suicide. She denies past suicidal attempt or history of self harming. She denies homicidal ideation, paranoia, hallucination, substance abuse, and no delusion thought content noted during assessment.   Patient was able to contract for safety. Patient's mother also contracted for patient's safety. Patient's mother stated she doesn't think patient is a danger to herself or others but wanted patient to be evaluated.    Discuss following up with Dr. Jannifer Franklin for medication adjusment and increasing therapy sessions. Patient and mother are agreeable to plan. Patient says she will contact her providers' offices to schedule appointment for follow-ups.    Psychiatric Specialty Exam  Presentation  General Appearance:Appropriate for Environment  Eye Contact:Good  Speech:Clear and Coherent  Speech Volume:Normal  Handedness:Right   Mood and Affect  Mood:Depressed; Anxious  Affect:Appropriate   Thought Process  Thought Processes:Coherent  Descriptions of Associations:Intact  Orientation:Full (Time, Place and Person)  Thought Content:WDL    Hallucinations:None  Ideas of Reference:None  Suicidal Thoughts:Yes, Passive Without Intent; Without Plan  Homicidal Thoughts:No   Sensorium  Memory:Immediate Good; Recent Good; Remote Good  Judgment:Fair  Insight:Good   Executive Functions  Concentration:No data recorded Attention Span:Good  Recall:Good  Fund of Knowledge:Good  Language:Good   Psychomotor Activity  Psychomotor Activity:Normal   Assets  Assets:Communication Skills; Desire for Improvement; Financial Resources/Insurance; Housing; Social Support; Physical Health   Sleep  Sleep:Good  Number of hours: 10   No data recorded  Physical Exam: Physical Exam Vitals and nursing note reviewed.  Constitutional:      General: She is not in acute distress.    Appearance: She is well-developed.  HENT:     Head: Normocephalic and atraumatic.  Eyes:     Conjunctiva/sclera: Conjunctivae normal.  Cardiovascular:     Rate and Rhythm: Normal rate.  Pulmonary:     Effort: Pulmonary effort is normal. No respiratory distress.     Breath sounds: Normal breath sounds.  Abdominal:     Palpations: Abdomen is soft.     Tenderness: There is no abdominal tenderness.  Musculoskeletal:     Cervical back: Neck supple.  Skin:  General: Skin is warm and dry.  Neurological:      Mental Status: She is alert and oriented to person, place, and time.  Psychiatric:        Attention and Perception: Attention and perception normal. She does not perceive auditory or visual hallucinations.        Mood and Affect: Mood is anxious and depressed.        Speech: Speech normal.        Behavior: Behavior normal. Behavior is cooperative.        Thought Content: Thought content is not paranoid or delusional. Thought content includes suicidal ideation. Thought content does not include homicidal ideation. Thought content does not include homicidal or suicidal plan.        Cognition and Memory: Cognition normal.   Review of Systems  Constitutional: Negative.   HENT: Negative.    Eyes: Negative.   Respiratory: Negative.    Cardiovascular: Negative.   Gastrointestinal: Negative.   Genitourinary: Negative.   Musculoskeletal: Negative.   Skin: Negative.   Neurological: Negative.   Endo/Heme/Allergies: Negative.   Psychiatric/Behavioral:  Positive for depression and suicidal ideas. Negative for hallucinations and substance abuse. The patient is nervous/anxious.   Blood pressure 130/88, pulse 97, temperature 98.4 F (36.9 C), temperature source Oral, resp. rate 16, SpO2 100 %. There is no height or weight on file to calculate BMI.  Musculoskeletal: Strength & Muscle Tone: within normal limits Gait & Station: normal Patient leans: Right   [facility] MSE Discharge Disposition for Follow up and Recommendations: Based on my evaluation the patient does not appear to have an emergency medical condition and can be discharged with resources and follow up care in outpatient services for Medication Management and Individual Therapy Discuss following up with Dr. Jannifer Franklin for medication adjusment and increasing therapy sessions. Patient and mother are agreeable to plan. Patient says she will contact her providers' offices to schedule appointment for follow-ups.  Discussed safety precaution and  safety planing with patient and her mother. Informed patient to return to [facility] or nearest ED if condition worsens. Also informed patient to contact suicidal hotline 988 or 911 if condition worsens.   Maricela Bo, NP 08/26/2021, 1:52 AM

## 2021-08-25 NOTE — Discharge Instructions (Addendum)

## 2021-08-25 NOTE — Progress Notes (Signed)
   08/25/21 2116  Patient Reported Information  How Did You Hear About [place]? Self  What Is the Reason for Your Visit/Call Today? Pt reports, she's feeling very depressed, her mother said she needed to get more help. Per pt, "I don't want to be dead but I don't want to be alive." Pt reports, "life sucks but dying is scary." Pt denies active suicidal ideations with a plan or access to means. Pt reports, she sees Laqueta Carina, [place] through the [facility] every Wednesday; she has Peer Support (one to one) that focuses on anxiety on Thursdays. Pt is also linked to Dr. Jannifer Franklin and Neurology (to manage her seizures) for medication management. Per mother, she's concerned the pt needs help, pt tells her "I'm tired of this, I can't do this anymore." Pt's mother reports, she does not think the pt will do something but she does not want to take that chance. Pt also denies, HI, AVH, self-injurious behaviors and access to weapons. Pt reports, if discharged she can contract for safety.  How Long Has This Been Causing You Problems? > than 6 months  What Do You Feel Would Help You the Most Today? Treatment for Depression or other mood problem  Have You Recently Had Any Thoughts About Hurting Yourself? No (Pt has recurrent passive suicidal ideations with no plan.)  Are You Planning to Commit Suicide/Harm Yourself At This time? No  Have you Recently Had Thoughts About Hurting Someone Karolee Ohs? No  Are You Planning To Harm Someone At This Time? No  Have You Used Any Alcohol or Drugs in the Past 24 Hours? No  Do You Currently Have a Therapist/Psychiatrist? Yes  Name of Therapist/Psychiatrist Pt reports, she sees Laqueta Carina, [place] through the [facility] every Wednesday; she has Peer Support (one to one) that focuses on anxiety on Thursdays. Pt is also linked to Dr. Jannifer Franklin and Neurology (to manage her seizures) for medication management Pt reports, she sees Laqueta Carina, [place] through the [facility] every  Wednesday; she has Peer Support (one to one) that focuses on anxiety on Thursdays. Pt is also linked to Dr. Jannifer Franklin and Neurology (to manage her seizures) for medication management.  [facility] Screening Triage Referral Assessment  Type of Contact Face-to-Face  Location of Assessment [facility] Assessment Services  Provider location [facility] Assessment Services  Collateral Involvement June Rode, mother, [phone].  Does Patient Present under Involuntary Commitment? No  [place] of Residence [place]  Patient Currently Receiving the Following Services: Office manager;Individual Therapy;Medication Management  Determination of Need Routine (7 days)  Options For Referral Medication Management;Outpatient Therapy;Other: Comment (Peer Support.)    Disposition: Cecilio Asper, NP recommends pt is psych cleared, pt to follow up with Outpatient providers. Clinician provided additional outpatient resources.   Determination of need: Routine.    Redmond Pulling, MS, [facility], [facility] Triage Specialist [phone]

## 2021-08-25 NOTE — ED Notes (Signed)
Pt has received and went over with nurse her AVS paperwork. Pt stated she understood the discharge recommendations. Pt has left with her mother.

## 2021-08-30 ENCOUNTER — Telehealth (HOSPITAL_COMMUNITY): Payer: Self-pay | Admitting: Nurse Practitioner

## 2021-08-30 NOTE — BH Assessment (Signed)
Care Management - Follow Up [facility] Discharges   Writer attempted to make contact with patient today and was unsuccessful.  Writer left a HIPPA compliant voice message.   Per chart review, patient will follow up with her established provider Dr. Jannifer Franklin.

## 2021-09-02 DIAGNOSIS — F4011 Social phobia, generalized: Secondary | ICD-10-CM | POA: Diagnosis not present

## 2021-09-02 DIAGNOSIS — F321 Major depressive disorder, single episode, moderate: Secondary | ICD-10-CM | POA: Diagnosis not present

## 2021-09-16 ENCOUNTER — Other Ambulatory Visit: Payer: Self-pay | Admitting: Family

## 2021-09-16 ENCOUNTER — Telehealth (INDEPENDENT_AMBULATORY_CARE_PROVIDER_SITE_OTHER): Payer: Self-pay | Admitting: Family

## 2021-09-16 ENCOUNTER — Other Ambulatory Visit (INDEPENDENT_AMBULATORY_CARE_PROVIDER_SITE_OTHER): Payer: Self-pay

## 2021-09-16 ENCOUNTER — Ambulatory Visit: Payer: BLUE CROSS/BLUE SHIELD | Admitting: Nurse Practitioner

## 2021-09-16 DIAGNOSIS — G40B09 Juvenile myoclonic epilepsy, not intractable, without status epilepticus: Secondary | ICD-10-CM

## 2021-09-16 DIAGNOSIS — Z79899 Other long term (current) drug therapy: Secondary | ICD-10-CM | POA: Diagnosis not present

## 2021-09-16 NOTE — Telephone Encounter (Signed)
I called and spoke with Mom. She said that Alison Brock awakened her in the middle of the night Thursday and again last night with feeling that she was going to have a seizure. She has not missed medications and has been generally healthy. She has had more problems with mood and emotion, and is having close follow up with behavioral health. I recommended that she have blood drawn for Levetiracetam level and I will call her when the results are available to me. I recommended ongoing close follow up with Behavioral Health. Mom agreed with these plans. TG

## 2021-09-16 NOTE — Addendum Note (Signed)
Addended by: Princella Ion on: 09/16/2021 11:33 AM   Modules accepted: Orders

## 2021-09-16 NOTE — Telephone Encounter (Signed)
  Who's calling (name and relationship to patient) : Dezyrae Kensinger, mom  Best contact number: [phone]  Provider they see: Dr. Blane Ohara  Reason for call: Mom has stated that [name] has had a seizure on Thursday and this morning. Mom wanted to know if she could bring [name] in and get blood work done.  Mom has requested a call back.    PRESCRIPTION REFILL ONLY  Name of prescription:  Pharmacy:

## 2021-09-20 ENCOUNTER — Telehealth (INDEPENDENT_AMBULATORY_CARE_PROVIDER_SITE_OTHER): Payer: Self-pay | Admitting: Family

## 2021-09-20 DIAGNOSIS — F4011 Social phobia, generalized: Secondary | ICD-10-CM | POA: Diagnosis not present

## 2021-09-20 DIAGNOSIS — F321 Major depressive disorder, single episode, moderate: Secondary | ICD-10-CM | POA: Diagnosis not present

## 2021-09-20 DIAGNOSIS — G40B09 Juvenile myoclonic epilepsy, not intractable, without status epilepticus: Secondary | ICD-10-CM

## 2021-09-20 LAB — LEVETIRACETAM LEVEL: Keppra (Levetiracetam): 7.5 ug/mL — ABNORMAL LOW

## 2021-09-20 MED ORDER — LEVETIRACETAM ER 750 MG PO TB24: ORAL_TABLET | ORAL | 0 refills | Status: DC

## 2021-09-20 NOTE — Telephone Encounter (Signed)
I called Alison Brock and her mother to discuss the recent Levetiracetam results. The level was 7.33mcg/ml which is considerably lower than the last level of 13.55mcg. I recommended increasing the Levetiracetam dose to 1AM and 2PM and will update her Rx at the pharmacy. Alison Brock has an appointment in January and I may consider suggesting Oxcarbazepine or Lamotrigine at her next visit. Alison Brock and her mother agreed with the plans made today. TG

## 2021-09-21 ENCOUNTER — Telehealth (INDEPENDENT_AMBULATORY_CARE_PROVIDER_SITE_OTHER): Payer: Self-pay | Admitting: Family

## 2021-09-21 NOTE — Telephone Encounter (Signed)
I received a call from [facility] Call service to speak to her mother about a seizure. I spoke to Mom who said that Alison Brock had a seizure this afternoon. Mom said that [name] did not increase the Levetiracetam dose as recommended yesterday because she slept until 1pm today. Mom said that a short while ago, Alison Brock felt the seizure coming on and was able to lie down on the bed. The seizure lasted a couple of minutes then resolved without intervention. Alison Brock now reports burning pain in her head. I recommended that she take a dose of Levetiracetam now, as well as Tylenol, drink some water and rest. I advised her to take her PM medication as scheduled and to get on track tomorrow with the additional Levetiracetam dose as we have discussed. She and her mother agreed with these plans. TG

## 2021-09-23 NOTE — Telephone Encounter (Signed)
I called and spoke with Mom. She said that Alison Brock had another seizure last night around midnight She said that she had not gone to sleep yet, felt that a seizure was going to occur and awakened her mother. Alison Brock reported a burning sensation in her head then had some mild shaking. Alison Brock is very frightened by these events. I recommended to Mom that Alison Brock have a 48 hour EEG at home and Mom agreed. I will send in the orders for that. I asked Mom to continue to let me know when seizures occur. TG

## 2021-09-23 NOTE — Telephone Encounter (Signed)
Mom contacted me to report that Alison Brock had another seizure early this morning at around 4AM. She said that [place] awakened with feeling that seizure was going to occur. She was not injured with the seizure. She took 1/2 Ativan and rested afterwards. [name] had increased the Levetiracetam dose as requested. I recommended to Mom that Alison Brock continued to take the increased dose, that she should not drive and that I will follow up with her on Monday. TG

## 2021-10-01 ENCOUNTER — Ambulatory Visit (INDEPENDENT_AMBULATORY_CARE_PROVIDER_SITE_OTHER): Payer: BC Managed Care – PPO | Admitting: Family Medicine

## 2021-10-01 ENCOUNTER — Encounter: Payer: Self-pay | Admitting: Family Medicine

## 2021-10-01 ENCOUNTER — Other Ambulatory Visit: Payer: Self-pay

## 2021-10-01 VITALS — BP 124/80 | HR 124 | Temp 97.6°F | Ht 64.0 in | Wt 213.0 lb

## 2021-10-01 DIAGNOSIS — B349 Viral infection, unspecified: Secondary | ICD-10-CM | POA: Diagnosis not present

## 2021-10-01 DIAGNOSIS — J069 Acute upper respiratory infection, unspecified: Secondary | ICD-10-CM | POA: Diagnosis not present

## 2021-10-01 DIAGNOSIS — J4521 Mild intermittent asthma with (acute) exacerbation: Secondary | ICD-10-CM

## 2021-10-01 MED ORDER — PREDNISONE 10 MG PO TABS: 10.0000 mg | ORAL_TABLET | Freq: Two times a day (BID) | ORAL | 0 refills | Status: AC

## 2021-10-01 MED ORDER — PREDNISONE 10 MG PO TABS: 10.0000 mg | ORAL_TABLET | Freq: Two times a day (BID) | ORAL | 0 refills | Status: DC

## 2021-10-01 NOTE — Addendum Note (Signed)
Addended by: Andrez Grime on: 10/01/2021 02:12 PM   Modules accepted: Orders

## 2021-10-01 NOTE — Progress Notes (Signed)
Established Patient Office Visit  Subjective:  Patient ID: Alison Brock, female    DOB: [birth year]  Age: 20 y.o. MRN: [id number]  CC:  Chief Complaint  Patient presents with   Sore Throat    Sore throat, chest congestion, body aches, chest pain, fatigue symptoms x started this morning.     HPI Kavya Mcadow presents for evaluation and treatment of a 1 day history of nasal congestion postnasal drip sore throat cough with mild wheeze and chest pain.  She denies headaches fevers or chills.  No asthma history.  She has smoked a few cigarettes of the last few days.  There are body aches.  Denies nausea and vomiting or decreased appetite.  No pain with deep breath.  She was exposed to someone with a flu.  Indirectly exposed to RSV.  She has had her COVID vaccines.  She has not had a flu vaccine.  Negative COVID test at home.  Past Medical History:  Diagnosis Date   Anxiety    Seizures ([place])     Past Surgical History:  Procedure Laterality Date   GUM SURGERY  03/2016    Family History  Problem Relation Age of Onset   Hypertension Mother    Hypertension Maternal Grandmother    Hypertension Paternal Grandfather    Depression Paternal Grandfather    Bipolar disorder Maternal Aunt    Anxiety disorder Maternal Aunt     Social History   Socioeconomic History   Marital status: Single    Spouse name: Not on file   Number of children: Not on file   Years of education: Not on file   Highest education level: Not on file  Occupational History   Not on file  Tobacco Use   Smoking status: Never    Passive exposure: Yes   Smokeless tobacco: Never  Vaping Use   Vaping Use: Never used  Substance and Sexual Activity   Alcohol use: No   Drug use: No   Sexual activity: Never  Other Topics Concern   Not on file  Social History Narrative   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    Lives with her mother.   Social Determinants of  Health   Financial Resource Strain: Not on file  Food Insecurity: Not on file  Transportation Needs: Not on file  Physical Activity: Not on file  Stress: Not on file  Social Connections: Not on file  Intimate Partner Violence: Not on file    Outpatient Medications Prior to Visit  Medication Sig Dispense Refill   amitriptyline (ELAVIL) 25 MG tablet TAKE TWO TABLETS BY MOUTH EVERY NIGHT AT BEDTIME 180 tablet 0   busPIRone (BUSPAR) 15 MG tablet Take 15 mg by mouth 2 (two) times daily.      Levetiracetam 750 MG TB24 TAKE 1 (ONE) TABLET IN THE MORNING AND 2 (TWO) TABLETS AT NIGHT 90 tablet 0   LORazepam (ATIVAN) 0.5 MG tablet TAKE 1/2 TO 1 (ONE-HALF TO ONE) TABLET BY MOUTH ONCE DAILY AS NEEDED FOR ANXIETY (Patient taking differently: Take 0.25-0.5 mg by mouth daily as needed for anxiety. TAKE 1/2 TO 1 (ONE-HALF TO ONE) TABLET BY MOUTH ONCE DAILY AS NEEDED FOR ANXIETY) 30 tablet 0   Magnesium Oxide 500 MG TABS Take 500 mg by mouth daily.      norethindrone (MICRONOR) 0.35 MG tablet Take 0.35 mg by mouth daily. AM     pyridOXINE (VITAMIN B-6) 100 MG  tablet Take 100 mg by mouth daily.     riboflavin (VITAMIN B-2) 100 MG TABS tablet Take 100 mg by mouth daily.     No facility-administered medications prior to visit.    Allergies  Allergen Reactions   Sulfamethoxazole-Trimethoprim Nausea And Vomiting   Bentyl [Dicyclomine Hcl]    Doxycycline Other (See Comments)   Grapefruit Extract Other (See Comments)    Pt is not supposed to eat grapefruit with medication    Other     Seasonal Allergies - Spring and Fall   Topiramate Er     ROS Review of Systems  Constitutional:  Positive for fatigue. Negative for diaphoresis, fever and unexpected weight change.  HENT:  Positive for congestion, postnasal drip, rhinorrhea and sore throat.   Eyes:  Negative for photophobia and visual disturbance.  Respiratory:  Positive for cough and wheezing. Negative for shortness of breath.   Cardiovascular:   Positive for chest pain. Negative for palpitations and leg swelling.  Gastrointestinal:  Negative for nausea and vomiting.  Endocrine: Negative for polyphagia and polyuria.  Genitourinary: Negative.   Musculoskeletal:  Positive for myalgias.  Skin:  Negative for pallor and rash.  Neurological:  Negative for light-headedness and headaches.     Objective:    Physical Exam Vitals and nursing note reviewed.  Constitutional:      General: She is not in acute distress.    Appearance: She is well-developed. She is not ill-appearing, toxic-appearing or diaphoretic.  HENT:     Head: Normocephalic and atraumatic.     Right Ear: Tympanic membrane and ear canal normal. No middle ear effusion. Tympanic membrane is not erythematous.     Left Ear: Tympanic membrane and ear canal normal.  No middle ear effusion. Tympanic membrane is not erythematous.     Mouth/Throat:     Mouth: Mucous membranes are moist.     Pharynx: Oropharynx is clear.  Eyes:     Conjunctiva/sclera: Conjunctivae normal.     Pupils: Pupils are equal, round, and reactive to light.  Neck:     Thyroid: No thyromegaly.  Cardiovascular:     Rate and Rhythm: Normal rate and regular rhythm.  Pulmonary:     Effort: Pulmonary effort is normal. No respiratory distress.     Breath sounds: Normal breath sounds. No wheezing or rales.  Musculoskeletal:     Cervical back: Normal range of motion and neck supple.  Lymphadenopathy:     Cervical: No cervical adenopathy.  Skin:    General: Skin is warm and dry.  Neurological:     Mental Status: She is alert and oriented to person, place, and time.  Psychiatric:        Mood and Affect: Mood normal.        Behavior: Behavior normal.    BP 124/80 (BP Location: Left Arm, Patient Position: Sitting, Cuff Size: Large)    Pulse (!) 124    Temp 97.6 F (36.4 C) (Temporal)    Ht 5\' 4"  (1.626 m)    Wt 213 lb (96.6 kg)    SpO2 98%    BMI 36.56 kg/m  Wt Readings from Last 3 Encounters:  10/01/21  213 lb (96.6 kg)  08/01/21 205 lb 12.8 oz (93.4 kg) (98 %, Z= 2.03)*  11/14/20 174 lb 9.6 oz (79.2 kg) (94 %, Z= 1.52)*   * Growth percentiles are based on CDC (Girls, 2-20 Years) data.     Health Maintenance Due  Topic Date Due   Pneumococcal Vaccine  68-58 Years old (1 - PCV) Never done   HPV VACCINES (1 - 2-dose series) Never done   Hepatitis C Screening  Never done   COVID-19 Vaccine (3 - Pfizer risk series) 09/01/2020   INFLUENZA VACCINE  Never done       Topic Date Due   HPV VACCINES (1 - 2-dose series) Never done    Lab Results  Component Value Date   TSH 1.06 06/02/2016   Lab Results  Component Value Date   WBC 15.3 (H) 06/02/2019   HGB 12.4 06/02/2019   HCT 37.6 06/02/2019   MCV 87.4 06/02/2019   PLT 281 06/02/2019   Lab Results  Component Value Date   NA 137 06/04/2019   K 3.9 06/04/2019   CO2 21 (L) 06/04/2019   GLUCOSE 92 06/04/2019   BUN 7 06/04/2019   CREATININE 0.66 06/04/2019   BILITOT 0.8 06/04/2019   ALKPHOS 49 06/04/2019   AST 20 06/04/2019   ALT 21 06/04/2019   PROT 6.7 06/04/2019   ALBUMIN 3.2 (L) 06/04/2019   CALCIUM 8.6 (L) 06/04/2019   ANIONGAP 6 06/04/2019   No results found for: CHOL No results found for: HDL No results found for: LDLCALC No results found for: TRIG No results found for: CHOLHDL No results found for: HGBA1C    Assessment & Plan:   Problem List Items Addressed This Visit   None Visit Diagnoses     Viral syndrome    -  Primary   Relevant Orders   COVID-19, Flu A+B and RSV   Viral upper respiratory tract infection with cough       Relevant Orders   COVID-19, Flu A+B and RSV   Mild intermittent reactive airway disease with acute exacerbation       Relevant Medications   predniSONE (DELTASONE) 10 MG tablet       Meds ordered this encounter  Medications   predniSONE (DELTASONE) 10 MG tablet    Sig: Take 1 tablet (10 mg total) by mouth 2 (two) times daily with a meal for 5 days.    Dispense:  10 tablet     Refill:  0     Follow-up: Return in about 1 week (around 10/08/2021), or if symptoms worsen or fail to improve.    Libby Maw, MD

## 2021-10-02 LAB — COVID-19, FLU A+B AND RSV
Influenza A, NAA: NOT DETECTED
Influenza B, NAA: NOT DETECTED
RSV, NAA: NOT DETECTED
SARS-CoV-2, NAA: NOT DETECTED

## 2021-10-03 ENCOUNTER — Telehealth: Payer: Self-pay | Admitting: Nurse Practitioner

## 2021-10-09 NOTE — Telephone Encounter (Signed)
Issue already addressed. Sw, cma

## 2021-10-15 ENCOUNTER — Other Ambulatory Visit (INDEPENDENT_AMBULATORY_CARE_PROVIDER_SITE_OTHER): Payer: Self-pay | Admitting: Family

## 2021-10-15 DIAGNOSIS — R569 Unspecified convulsions: Secondary | ICD-10-CM

## 2021-10-15 DIAGNOSIS — F4011 Social phobia, generalized: Secondary | ICD-10-CM | POA: Diagnosis not present

## 2021-10-15 DIAGNOSIS — F321 Major depressive disorder, single episode, moderate: Secondary | ICD-10-CM | POA: Diagnosis not present

## 2021-10-15 DIAGNOSIS — G40B09 Juvenile myoclonic epilepsy, not intractable, without status epilepticus: Secondary | ICD-10-CM

## 2021-10-28 ENCOUNTER — Other Ambulatory Visit (INDEPENDENT_AMBULATORY_CARE_PROVIDER_SITE_OTHER): Payer: Self-pay | Admitting: Family

## 2021-10-28 ENCOUNTER — Encounter (INDEPENDENT_AMBULATORY_CARE_PROVIDER_SITE_OTHER): Payer: Self-pay | Admitting: Neurology

## 2021-10-28 DIAGNOSIS — G40B09 Juvenile myoclonic epilepsy, not intractable, without status epilepticus: Secondary | ICD-10-CM | POA: Diagnosis not present

## 2021-10-28 DIAGNOSIS — Z79899 Other long term (current) drug therapy: Secondary | ICD-10-CM

## 2021-10-28 NOTE — Procedures (Signed)
Patient:  Alison Brock   Sex: female  DOB:  [birth year]   AMBULATORY ELECTROENCEPHALOGRAM WITH VIDEO     PATIENT NAME: Alison Brock GENDER: Female DATE OF BIRTH: 2001-08-12  PATIENT ID#: [id number] ORDERED: 48 Hour Ambulatory with Video DURATION: 46 Hours with Video STUDY START DATE/TIME: 10/15/2021 1453 STUDY END DATE/TIME: 10/17/2021 1235 BILLING HOURS: 46 READING PHYSICIAN: Keturah Shavers, MD. REFERRING PHYSICIAN: Elveria Rising, NP. TECHNOLOGIST: Nanette Rippere, R. EEG T. VIDEO: Yes EKG: Yes  AUDIO: Yes   MEDICATIONS: Keppra, Ativan, Amitriptyline, Buspirone    TECHNICAL NOTES This is a 48-hour video ambulatory EEG study that was recorded for 46 hours in duration. The study was recorded from October 15, 2021 to October 17, 2021 and was being remotely monitored by a registered technologist to ensure the integrity of the video and EEG for the entire duration of the recording. If needed the physician was contacted to intervene with the option to diagnose and treat the patient and alter or end the recording. The patient was educated on the procedure prior to starting the study. The patient's head was measured and marked using the international 10/20 system, 23 channel digital bipolar EEG connections (over temporal over parasagittal montage).  Additional channels for EOG and EKG.  Recording was continuous and recorded in a bipolar montage that can be re-montaged.  Calibration and impedances were recorded in all channels at 10kohms. The EEG may be flagged at the direction of the patient using a push button. Seizure and Spike analysis was performed and reviewed. A Patient Daily Log sheet is provided to document patient daily activities as well as Patient Event Log sheet for any episodes in question.  HYPERVENTILATION Hyperventilation was not performed for this study.   PHOTIC STIMULATION Photic Stimulation was not performed for this study.   HISTORY The patient is a 21-  year-old, right- handed female. The patient reports a history of seizures since age 59. She reports no apparent seizure activity lately but is concerned for increase anxiety. This study was ordered for evaluation.            SLEEP FEATURES Stages 1, 2, 3, and REM sleep were observed. The patient had a couple of arousals over the night and slept for about 7-11 hours. Sleep variants like sleep spindles, vertex sharp waves and k-complexes were all noted during sleeping portions of the study.  Day 1 - Sleep at 0120; Wake at 0819 Day 2 - Sleep at 0143; Wake at 1224   CLINICAL SUMMARY The study was recorded and remotely monitored by a registered technologist for 46 hours to ensure integrity of the video and EEG for the entire duration of the recording. The patient returned the Patient Log Sheets. Posterior Dominant Rhythm of 9-10 Hz with an average amplitude of 35uV predominately seen in the posterior regions was noted during waking hours. Background was reactive to eye movements, attenuated with opening and repopulated with closure. There were no apparent abnormalities or asymmetries noted by the scanning technologist.  All and any possible abnormalities have been clipped for further review by the physician.   EVENTS The patient logged 0 events and there were 0 "patient event" button pushes noted.  Event #1 - 10/16/21 at 1606 - Button not pushed. Patient logged bad headache very fatigued; the patient is seen on camera sitting in bed. There were no clinical or EEG correlations noted.  Event #2 - 10/16/21 at 2310 - Button pushed. Patient logged head feels weird all of a sudden, ringing in ears;  the patient is seen on camera sitting in bed. There were no clinical or EEG correlations noted.   EKG EKG was regular with a heart rate of 60-72 bpm with arrhythmias noted.    PHYSICIAN CONCLUSION/IMPRESSION:  This prolonged 48-hour ambulatory video EEG is slightly abnormal due to occasional  sporadic multifocal single sharps particularly in the frontocentral area as well as occasional single generalized sharps throughout the recording.  There were no transient rhythmic activities or electrographic seizures noted.  There were 2 pushbutton events reported.  The 2 clinical episodes as described above, were not correlating with any rhythmic activity or electrographic seizures on EEG. The findings are correlating with slight increased epileptic potential, could be associated with lower seizure threshold and recommend careful clinical correlation.  Although the clinical episodes are most likely nonepileptic.      Event #2 - 10/16/21 at 2310 - Button pushed. Patient logged head feels weird all of a sudden, ringing in ears; the patient is seen on camera sitting in bed. There were no clinical or EEG correlations noted.   Wake Sample    Sleep      Keturah Shavers, MD

## 2021-10-28 NOTE — Progress Notes (Signed)
Confirmed levetiracetam lab is random not peak or trough per Elveria Rising FNP-C, Information given to [facility] with [facility]

## 2021-10-31 ENCOUNTER — Other Ambulatory Visit: Payer: Self-pay

## 2021-10-31 ENCOUNTER — Ambulatory Visit (INDEPENDENT_AMBULATORY_CARE_PROVIDER_SITE_OTHER): Payer: BC Managed Care – PPO | Admitting: Family

## 2021-10-31 ENCOUNTER — Encounter (INDEPENDENT_AMBULATORY_CARE_PROVIDER_SITE_OTHER): Payer: Self-pay | Admitting: Family

## 2021-10-31 VITALS — BP 114/68 | HR 72 | Wt 217.0 lb

## 2021-10-31 DIAGNOSIS — F419 Anxiety disorder, unspecified: Secondary | ICD-10-CM | POA: Diagnosis not present

## 2021-10-31 DIAGNOSIS — F4010 Social phobia, unspecified: Secondary | ICD-10-CM

## 2021-10-31 DIAGNOSIS — F410 Panic disorder [episodic paroxysmal anxiety] without agoraphobia: Secondary | ICD-10-CM | POA: Diagnosis not present

## 2021-10-31 DIAGNOSIS — G40B09 Juvenile myoclonic epilepsy, not intractable, without status epilepticus: Secondary | ICD-10-CM | POA: Diagnosis not present

## 2021-11-01 LAB — LEVETIRACETAM LEVEL: Keppra (Levetiracetam): 19.7 ug/mL

## 2021-11-01 NOTE — Patient Instructions (Signed)
It was a pleasure to see you today!  Instructions for you until your next appointment are as follows: Continue taking your medication as prescribed Do not drive for now. Contact me at the end of the month and we can discuss it at that time.  Continue working with your therapist and psychiatrist Think about working on being up and active during the day as we discussed today. I think volunteering is a good idea and hope that works out for you.  I will call you when I receive the Keppra level.  Please sign up for MyChart if you have not done so. Please plan to return for follow up in 6 months or sooner if needed.    Feel free to contact our office during normal business hours at [phone] with questions or concerns. If there is no answer or the call is outside business hours, please leave a message and our clinic staff will call you back within the next business day.  If you have an urgent concern, please stay on the line for our after-hours answering service and ask for the on-call neurologist.     I also encourage you to use MyChart to communicate with me more directly. If you have not yet signed up for MyChart within [facility], the front desk staff can help you. However, please note that this inbox is NOT monitored on nights or weekends, and response can take up to 2 business days.  Urgent matters should be discussed with the on-call pediatric neurologist.   At [facility], we are committed to providing exceptional care. You will receive a patient satisfaction survey through text or email regarding your visit today. Your opinion is important to me. Comments are appreciated.

## 2021-11-01 NOTE — Progress Notes (Signed)
Alison Brock   MRN:  [id number]  Nov 03, 2000   Provider: Elveria Rising NP-C Location of Care: [facility]  Visit type: Return visit   Last visit: 11/14/2020  Referral source: Nelda Marseille, MD History from: Epic chart, patient and her mother  Brief history:  Copied from previous record: History of generalized seizure disorder based on clinical seizure activity and EEG, as well as headaches, anxiety and panic. She is taking and tolerating Levetiracetam XR for her seizure disorder and has remained seizure free since March 2020. She is taking and tolerating Amitriptyline for migraine prevention. Alison Brock is seeing Dr Jannifer Franklin for anxiety and panic and is taking Buspar and Lorazepam  Today's concerns: Alison Brock reports today that she had a seizure on Christmas day. She denies any missed doses and says that she has been getting enough sleep. She has ongoing problems with mood and sees a therapist and psychiatrist regularly. She has been under some unusual stress in that recently she had a good friend pass away suddenly and says that she has had other friends to die unexpectedly since she was 64 years old.   Alison Brock plans to talk to a vet office near her home to see if she could volunteer there to give her something to do each day. She doesn't feel ready for the responsibilities of a paying job.   Alison Brock had lab draw a few days ago to check her Levetiracetam level and is hopeful that it si therapeutic. She has been otherwise generally healthy since she was last seen. Neither she nor her mother have other health concerns for her today other than previously mentioned.  Review of systems: Please see HPI for neurologic and other pertinent review of systems. Otherwise all other systems were reviewed and were negative.  Problem List: Patient Active Problem List   Diagnosis Date Noted   Encounter for long-term current use of medication 11/18/2020   Pyelonephritis 06/02/2019    Migraine variants, not intractable 02/09/2019   Panic disorder 12/25/2018   Social anxiety disorder 10/06/2018   Frequent headaches 07/08/2016   Anxiety 06/26/2016   Nonintractable juvenile myoclonic epilepsy without status epilepticus (HCC) 06/26/2016   Seizure disorder (HCC) 06/26/2016   AP (abdominal pain) 06/02/2016   Loss of weight 06/02/2016   Constipation 06/02/2016     Past Medical History:  Diagnosis Date   Anxiety    Seizures (HCC)     Past medical history comments: See HPI Copied from previous record:  Surgical history: Past Surgical History:  Procedure Laterality Date   GUM SURGERY  03/2016     Family history: family history includes Anxiety disorder in her maternal aunt; Bipolar disorder in her maternal aunt; Depression in her paternal grandfather; Hypertension in her maternal grandmother, mother, and paternal grandfather.   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   She is taking a gap year before college. She is not currently working.    Lives with her mother.   Social Determinants of Health   Financial Resource Strain: Not on file  Food Insecurity: Not on file  Transportation Needs: Not on file  Physical Activity: Not on file  Stress: Not on file  Social Connections: Not on file  Intimate Partner Violence: Not on file    Past/failed meds: Copied from previous  record: Topiramate ER - worsened anxiety and panic   Allergies: Allergies  Allergen Reactions   Sulfamethoxazole-Trimethoprim Nausea And Vomiting   Bentyl [Dicyclomine Hcl]    Doxycycline Other (See Comments)   Grapefruit Extract Other (See Comments)    Pt is not supposed to eat grapefruit with medication    Other     Seasonal Allergies - Spring and Fall   Topiramate Er     Immunizations: Immunization History  Administered Date(s) Administered   PFIZER(Purple Top)SARS-COV-2 Vaccination 07/07/2020, 08/04/2020   Tdap 01/10/2013    Diagnostics/Screenings: Copied from previous record: EEG 04/29/18 - This EEG is abnormal due to episodes of generalized discharges, more frontally predominant throughout the recording. The findings consistent with generalized seizure disorder, possibly juvenile myoclonic epilepsy, associated with lower seizure threshold and require careful clinical correlation  Physical Exam: BP 114/68    Pulse 72    Wt 217 lb (98.4 kg)    BMI 37.25 kg/m   Wt Readings from Last 3 Encounters:  10/31/21 217 lb (98.4 kg)  10/01/21 213 lb (96.6 kg)  08/01/21 205 lb 12.8 oz (93.4 kg) (98 %, Z= 2.03)*   * Growth percentiles are based on CDC (Girls, 2-20 Years) data.   General: Well developed, well nourished young woman, seated on exam table, in no evident distress Head: Head normocephalic and atraumatic.  Oropharynx benign. Neck: Supple Cardiovascular: Regular rate and rhythm, no murmurs Respiratory: Breath sounds clear to auscultation Musculoskeletal: No obvious deformities or scoliosis Skin: No rashes or neurocutaneous lesions  Neurologic Exam Mental Status: Awake and fully alert.  Oriented to place and time.  Recent and remote memory intact.  Attention span, concentration, and fund of knowledge appropriate.  Mood and affect appropriate. Cranial Nerves: Fundoscopic exam reveals sharp disc margins.  Pupils equal, briskly reactive to light.  Extraocular movements full  without nystagmus. Hearing intact and symmetric to whisper.  Facial sensation intact.  Face tongue, palate move normally and symmetrically. Shoulder shrug normal Motor: Normal bulk and tone. Normal strength in all tested extremity muscles. Sensory: Intact to touch and temperature in all extremities.  Coordination: Rapid alternating movements normal in all extremities.  Finger-to-nose and heel-to shin performed accurately bilaterally.  Romberg negative. Gait and Station: Arises from chair without difficulty.  Stance is normal. Gait demonstrates normal stride length and balance.   Able to heel, toe and tandem walk without difficulty. Reflexes: 1+ and symmetric. Toes downgoing.   Impression: Nonintractable juvenile myoclonic epilepsy without status epilepticus (HCC)  Panic disorder  Social anxiety disorder  Anxiety   Recommendations for plan of care: [facility] Alison Needleaige has neither had nor required imaging studies since the last visit. She had blood drawn on Monday and the results are still pending. I will call her when there results are available to me. She is a 21 year old young woman with history of seizures, anxiety and panic. She is taking and tolerating Levetiracetam for her  seizure disorder. She had a seizure on Christmas Day so she is not permitted to drive at this time. I asked her to call me at the end of the month and we can discuss driving at that time. I instructed Alison Brock to continue to follow closely with her therapist and psychiatrist for her mood disorder. I will otherwise see her in follow up in 6 months or sooner if needed. She and her mother agreed with these plans.   The medication list was reviewed and reconciled. No changes were made in the prescribed medications today. A complete medication list was provided to the patient.  Return in about 6 months (around 04/30/2022).   Allergies as of 10/31/2021       Reactions    Sulfamethoxazole-trimethoprim Nausea And Vomiting   Bentyl [dicyclomine Hcl]    Doxycycline Other (See Comments)   Grapefruit Extract Other (See Comments)   Pt is not supposed to eat grapefruit with medication    Other    Seasonal Allergies - Spring and Fall   Topiramate Er         Medication List        Accurate as of October 31, 2021 11:59 PM. If you have any questions, ask your nurse or doctor.          amitriptyline 25 MG tablet Commonly known as: ELAVIL TAKE TWO TABLETS BY MOUTH EVERY NIGHT AT BEDTIME   busPIRone 15 MG tablet Commonly known as: BUSPAR Take 15 mg by mouth 2 (two) times daily.   Levetiracetam 750 MG Tb24 TAKE 1 TABLET BY MOUTH IN THE MORNING, THEN TAKE 2 TABLETS BY MOUTH AT NIGHT   Lo Loestrin Fe 1 MG-10 MCG / 10 MCG tablet Generic drug: Norethindrone-Ethinyl Estradiol-Fe Biphas Take 1 tablet by mouth daily.   LORazepam 0.5 MG tablet Commonly known as: ATIVAN TAKE 1/2 TO 1 (ONE-HALF TO ONE) TABLET BY MOUTH ONCE DAILY AS NEEDED FOR ANXIETY What changed: See the new instructions.   Magnesium Oxide 500 MG Tabs Take 500 mg by mouth daily.   pyridOXINE 100 MG tablet Commonly known as: VITAMIN B-6 Take 100 mg by mouth daily.   riboflavin 100 MG Tabs tablet Commonly known as: VITAMIN B-2 Take 100 mg by mouth daily.      Total time spent with the patient was 30 minutes, of which 50% or more was spent in counseling and coordination of care.  Elveria Rising NP-C [facility] Ph. [phone] Fax [phone]

## 2021-11-02 ENCOUNTER — Encounter (INDEPENDENT_AMBULATORY_CARE_PROVIDER_SITE_OTHER): Payer: Self-pay | Admitting: Family

## 2021-11-15 ENCOUNTER — Other Ambulatory Visit (INDEPENDENT_AMBULATORY_CARE_PROVIDER_SITE_OTHER): Payer: Self-pay | Admitting: Family

## 2021-11-15 DIAGNOSIS — G40B09 Juvenile myoclonic epilepsy, not intractable, without status epilepticus: Secondary | ICD-10-CM

## 2021-12-02 ENCOUNTER — Telehealth (INDEPENDENT_AMBULATORY_CARE_PROVIDER_SITE_OTHER): Payer: Self-pay | Admitting: Family

## 2021-12-02 DIAGNOSIS — G40B09 Juvenile myoclonic epilepsy, not intractable, without status epilepticus: Secondary | ICD-10-CM

## 2021-12-02 DIAGNOSIS — R5383 Other fatigue: Secondary | ICD-10-CM

## 2021-12-02 DIAGNOSIS — Z79899 Other long term (current) drug therapy: Secondary | ICD-10-CM

## 2021-12-02 NOTE — Telephone Encounter (Signed)
Mom contacted me and asked me to call Alison Brock. I called her and she said that she had been unusually tired for the last week, had some muscle spasms in her back, then had a seizure last night. I recommended blood tests to check Levetiracetam level and general blood counts and metabolic levels. Alison Brock agreed and will come in this week to have blood drawn. TG

## 2021-12-05 ENCOUNTER — Encounter: Payer: Self-pay | Admitting: Family Medicine

## 2021-12-05 ENCOUNTER — Other Ambulatory Visit: Payer: Self-pay

## 2021-12-05 ENCOUNTER — Telehealth (INDEPENDENT_AMBULATORY_CARE_PROVIDER_SITE_OTHER): Payer: BC Managed Care – PPO | Admitting: Family Medicine

## 2021-12-05 VITALS — Temp 99.0°F | Ht 64.0 in | Wt 217.0 lb

## 2021-12-05 DIAGNOSIS — G9331 Postviral fatigue syndrome: Secondary | ICD-10-CM

## 2021-12-05 DIAGNOSIS — B349 Viral infection, unspecified: Secondary | ICD-10-CM | POA: Diagnosis not present

## 2021-12-05 DIAGNOSIS — R0981 Nasal congestion: Secondary | ICD-10-CM

## 2021-12-05 MED ORDER — PREDNISONE 10 MG PO TABS: 10.0000 mg | ORAL_TABLET | Freq: Two times a day (BID) | ORAL | 0 refills | Status: AC

## 2021-12-05 NOTE — Progress Notes (Signed)
Established Patient Office Visit  Subjective:  Patient ID: Alison Brock, female    DOB: January 02, 2001  Age: 21 y.o. MRN: [id number]  CC:  Chief Complaint  Patient presents with   Cough    Cough, fever congestion, runny nose fatigue symptoms x 1 week.     HPI Alison Brock presents for evaluation of a 7-day history of severe nasal congestion, sinus pressure, postnasal drip, sore throat, cough, malaise and fatigue, low-grade temperature and decreased appetite.  She has tried Mucinex and Zyrtec without relief.  Cannot take decongestants secondary to Keppra.  She was scheduled to see me live but developed a panic attack and went back out to her car.  She is accompanied by her mom today.  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  Her 20 schedule I had created for version of that for her and she so do some manipulation but that symptomatic so this is not  Past Medical History:  Diagnosis Date   Anxiety    Seizures ([place])     Past Surgical History:  Procedure Laterality Date   GUM SURGERY  03/2016    Family History  Problem Relation Age of Onset   Hypertension Mother    Hypertension Maternal Grandmother    Hypertension Paternal Grandfather    Depression Paternal Grandfather    Bipolar disorder Maternal Aunt    Anxiety disorder Maternal Aunt     Social History   Socioeconomic History   Marital status: Single    Spouse name: Not on file   Number of children: Not on file   Years of education: Not on file   Highest education level: Not on file  Occupational History   Not on file  Tobacco Use   Smoking status: Never    Passive exposure: Yes   Smokeless tobacco: Never   Tobacco comments:    Family smokes outside  Vaping Use   Vaping Use: Never used  Substance and Sexual Activity   Alcohol use: No   Drug use: No   Sexual activity:  Never  Other Topics Concern   Not on file  Social History Narrative   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   She is taking a gap year before college. She is not currently working.    Lives with her mother.   Social Determinants of Health   Financial Resource Strain: Not on file  Food Insecurity: Not on file  Transportation Needs: Not on file  Physical Activity: Not on file  Stress: Not on file  Social Connections: Not on file  Intimate Partner Violence: Not on file    Outpatient Medications Prior to Visit  Medication Sig Dispense Refill   amitriptyline (ELAVIL) 25 MG tablet TAKE TWO TABLETS BY MOUTH EVERY NIGHT AT BEDTIME 180 tablet 0   busPIRone (BUSPAR) 15 MG tablet Take 15 mg by mouth 2 (two) times daily.      Levetiracetam 750 MG TB24 TAKE ONE TABLET BY MOUTH EVERY MORNING AND TAKE TWO TABLETS BY MOUTH EVERY NIGHT AT BEDTIME 90 tablet 0   LORazepam (ATIVAN) 0.5 MG tablet TAKE 1/2 TO 1 (ONE-HALF TO ONE) TABLET BY MOUTH ONCE DAILY AS NEEDED FOR ANXIETY (Patient taking differently: Take 0.25-0.5 mg by mouth daily as needed for anxiety. TAKE 1/2 TO 1 (ONE-HALF TO ONE) TABLET BY MOUTH ONCE DAILY AS NEEDED FOR ANXIETY) 30  tablet 0   Magnesium Oxide 500 MG TABS Take 500 mg by mouth daily.      Norethindrone-Ethinyl Estradiol-Fe Biphas (LO LOESTRIN FE) 1 MG-10 MCG / 10 MCG tablet Take 1 tablet by mouth daily.     pyridOXINE (VITAMIN B-6) 100 MG tablet Take 100 mg by mouth daily.     riboflavin (VITAMIN B-2) 100 MG TABS tablet Take 100 mg by mouth daily.     No facility-administered medications prior to visit.    Allergies  Allergen Reactions   Sulfamethoxazole-Trimethoprim Nausea And Vomiting   Bentyl [Dicyclomine Hcl]    Doxycycline Other (See Comments)   Grapefruit Extract Other (See Comments)    Pt is not supposed to eat grapefruit with medication    Other     Seasonal Allergies - Spring and Fall   Topiramate Er     ROS Review of Systems  Constitutional:  Positive  for fatigue. Negative for chills, diaphoresis, fever and unexpected weight change.  HENT:  Positive for congestion, sinus pain and sore throat. Negative for trouble swallowing.   Eyes:  Negative for photophobia and visual disturbance.  Respiratory:  Positive for cough. Negative for shortness of breath and wheezing.   Cardiovascular: Negative.   Gastrointestinal:  Negative for abdominal pain, nausea and vomiting.  Genitourinary: Negative.   Musculoskeletal:  Positive for myalgias.  Neurological:  Positive for headaches. Negative for weakness and numbness.     Objective:    Physical Exam Vitals and nursing note reviewed.  Constitutional:      General: She is not in acute distress.    Appearance: Normal appearance. She is not ill-appearing, toxic-appearing or diaphoretic.  HENT:     Head: Normocephalic and atraumatic.  Eyes:     General: No scleral icterus.       Right eye: No discharge.        Left eye: No discharge.     Extraocular Movements: Extraocular movements intact.     Conjunctiva/sclera: Conjunctivae normal.  Pulmonary:     Effort: Pulmonary effort is normal.  Neurological:     Mental Status: She is alert and oriented to person, place, and time.  Psychiatric:        Mood and Affect: Mood normal.        Behavior: Behavior normal.    Temp 99 F (37.2 C) (Temporal)    Ht 5\' 4"  (1.626 m)    Wt 217 lb (98.4 kg)    BMI 37.25 kg/m  Wt Readings from Last 3 Encounters:  12/05/21 217 lb (98.4 kg)  10/31/21 217 lb (98.4 kg)  10/01/21 213 lb (96.6 kg)     Health Maintenance Due  Topic Date Due   HPV VACCINES (1 - 2-dose series) Never done   Hepatitis C Screening  Never done       Topic Date Due   HPV VACCINES (1 - 2-dose series) Never done    Lab Results  Component Value Date   TSH 1.06 06/02/2016   Lab Results  Component Value Date   WBC 15.3 (H) 06/02/2019   HGB 12.4 06/02/2019   HCT 37.6 06/02/2019   MCV 87.4 06/02/2019   PLT 281 06/02/2019   Lab  Results  Component Value Date   NA 137 06/04/2019   K 3.9 06/04/2019   CO2 21 (L) 06/04/2019   GLUCOSE 92 06/04/2019   BUN 7 06/04/2019   CREATININE 0.66 06/04/2019   BILITOT 0.8 06/04/2019   ALKPHOS 49 06/04/2019   AST 20  06/04/2019   ALT 21 06/04/2019   PROT 6.7 06/04/2019   ALBUMIN 3.2 (L) 06/04/2019   CALCIUM 8.6 (L) 06/04/2019   ANIONGAP 6 06/04/2019   No results found for: CHOL No results found for: HDL No results found for: LDLCALC No results found for: TRIG No results found for: CHOLHDL No results found for: HGBA1C    Assessment & Plan:   Problem List Items Addressed This Visit   None Visit Diagnoses     Viral syndrome    -  Primary   Postviral fatigue syndrome       Relevant Orders   CBC   CMV IgM   Epstein-Barr virus VCA antibody panel   Congestion of nasal sinus       Relevant Medications   predniSONE (DELTASONE) 10 MG tablet       Meds ordered this encounter  Medications   predniSONE (DELTASONE) 10 MG tablet    Sig: Take 1 tablet (10 mg total) by mouth 2 (two) times daily with a meal for 5 days.    Dispense:  10 tablet    Refill:  0    Follow-up: No follow-ups on file.   Low-dose prednisone for congestion.  We will follow-up next week for above labs if fatigue has not improved.  We will follow-up with me Thursday if not improving.  Virtual Visit via Video Note  I connected with Alison Brock on 12/05/21 at  4:00 PM EST by a video enabled telemedicine application and verified that I am speaking with the correct person using two identifiers.  Location: Patient: in her car with her mom out in our parking lot.  Provider: work   I discussed the limitations of evaluation and management by telemedicine and the availability of in person appointments. The patient expressed understanding and agreed to proceed.  History of Present Illness:    Observations/Objective:   Assessment and Plan:   Follow Up Instructions:    I discussed the  assessment and treatment plan with the patient. The patient was provided an opportunity to ask questions and all were answered. The patient agreed with the plan and demonstrated an understanding of the instructions.   The patient was advised to call back or seek an in-person evaluation if the symptoms worsen or if the condition fails to improve as anticipated.  I provided 25 minutes of non-face-to-face time during this encounter.   Libby Maw, MD  Libby Maw, MD

## 2021-12-12 ENCOUNTER — Other Ambulatory Visit (INDEPENDENT_AMBULATORY_CARE_PROVIDER_SITE_OTHER): Payer: Self-pay | Admitting: Family

## 2021-12-12 DIAGNOSIS — F321 Major depressive disorder, single episode, moderate: Secondary | ICD-10-CM | POA: Diagnosis not present

## 2021-12-12 DIAGNOSIS — F4011 Social phobia, generalized: Secondary | ICD-10-CM | POA: Diagnosis not present

## 2021-12-12 DIAGNOSIS — G40B09 Juvenile myoclonic epilepsy, not intractable, without status epilepticus: Secondary | ICD-10-CM

## 2021-12-13 ENCOUNTER — Other Ambulatory Visit: Payer: BC Managed Care – PPO

## 2021-12-13 ENCOUNTER — Other Ambulatory Visit: Payer: Self-pay

## 2021-12-13 DIAGNOSIS — G9331 Postviral fatigue syndrome: Secondary | ICD-10-CM

## 2021-12-16 ENCOUNTER — Other Ambulatory Visit: Payer: BC Managed Care – PPO

## 2022-01-11 ENCOUNTER — Other Ambulatory Visit (INDEPENDENT_AMBULATORY_CARE_PROVIDER_SITE_OTHER): Payer: Self-pay | Admitting: Family

## 2022-01-11 DIAGNOSIS — G40B09 Juvenile myoclonic epilepsy, not intractable, without status epilepticus: Secondary | ICD-10-CM

## 2022-03-07 ENCOUNTER — Encounter: Payer: Self-pay | Admitting: Nurse Practitioner

## 2022-03-07 ENCOUNTER — Ambulatory Visit (INDEPENDENT_AMBULATORY_CARE_PROVIDER_SITE_OTHER): Payer: 59 | Admitting: Nurse Practitioner

## 2022-03-07 VITALS — BP 122/82 | HR 134 | Temp 97.4°F | Ht 64.0 in | Wt 233.6 lb

## 2022-03-07 DIAGNOSIS — M79672 Pain in left foot: Secondary | ICD-10-CM | POA: Diagnosis not present

## 2022-03-07 NOTE — Patient Instructions (Signed)
Continue use of ibuprofen 600mg  1tab every 12hrs as needed for pain Wear shoes with adequate support at all time.  Foot Pain Many things can cause foot pain. Some common causes are: An injury. A sprain. Arthritis. Blisters. Bunions. Follow these instructions at home: Managing pain, stiffness, and swelling If directed, put ice on the painful area: Put ice in a plastic bag. Place a towel between your skin and the bag. Leave the ice on for 20 minutes, 2-3 times a day.  Activity Do not stand or walk for long periods. Return to your normal activities as told by your health care provider. Ask your health care provider what activities are safe for you. Do stretches to relieve foot pain and stiffness as told by your health care provider. Do not lift anything that is heavier than 10 lb (4.5 kg), or the limit that you are told, until your health care provider says that it is safe. Lifting a lot of weight can put added pressure on your feet. Lifestyle Wear comfortable, supportive shoes that fit you well. Do not wear high heels. Keep your feet clean and dry. General instructions Take over-the-counter and prescription medicines only as told by your health care provider. Rub your foot gently. Pay attention to any changes in your symptoms. Keep all follow-up visits as told by your health care provider. This is important. Contact a health care provider if: Your pain does not get better after a few days of self-care. Your pain gets worse. You cannot stand on your foot. Get help right away if: Your foot is numb or tingling. Your foot or toes are swollen. Your foot or toes turn white or blue. You have warmth and redness along your foot. Summary Common causes of foot pain are injury, sprain, arthritis, blisters, or bunions. Ice, medicines, and comfortable shoes may help foot pain. Contact your health care provider if your pain does not get better after a few days of self-care. This information  is not intended to replace advice given to you by your health care provider. Make sure you discuss any questions you have with your health care provider. Document Revised: 01/09/2021 Document Reviewed: 01/09/2021 Elsevier Patient Education  [place].

## 2022-03-07 NOTE — Progress Notes (Signed)
Acute Office Visit  Subjective:    Patient ID: Alison Brock, female    DOB: Jan 17, 2001, 20 y.o.   MRN: [id number]  Chief Complaint  Patient presents with   Acute Visit    Left Foot pain x 1 week, hasn't fallen or bumped into anything, just pain on the top of her foot    HPI Patient is in today with sudden onset of left forefoot pain since Monday. Associated with swelling and unable to bear weight. She denies any injury. She admits with walking barefeet or with flat slippers mostly. Recent use of motrin 600mg  daily, elevation and cold compress. These have provided moderate relief. She is able to bear weight on foot.  Outpatient Medications Prior to Visit  Medication Sig   amitriptyline (ELAVIL) 25 MG tablet TAKE TWO TABLETS BY MOUTH EVERY NIGHT AT BEDTIME   busPIRone (BUSPAR) 15 MG tablet Take 15 mg by mouth 2 (two) times daily.    Levetiracetam 750 MG TB24 TAKE ONE TABLET BY MOUTH EVERY MORNING AND TAKE TWO TABLETS BY MOUTH EVERY NIGHT AT BEDTIME   LORazepam (ATIVAN) 0.5 MG tablet TAKE 1/2 TO 1 (ONE-HALF TO ONE) TABLET BY MOUTH ONCE DAILY AS NEEDED FOR ANXIETY (Patient taking differently: Take 0.25-0.5 mg by mouth daily as needed for anxiety. TAKE 1/2 TO 1 (ONE-HALF TO ONE) TABLET BY MOUTH ONCE DAILY AS NEEDED FOR ANXIETY)   Magnesium Oxide 500 MG TABS Take 500 mg by mouth daily.    Norethindrone-Ethinyl Estradiol-Fe Biphas (LO LOESTRIN FE) 1 MG-10 MCG / 10 MCG tablet Take 1 tablet by mouth daily.   pyridOXINE (VITAMIN B-6) 100 MG tablet Take 100 mg by mouth daily.   riboflavin (VITAMIN B-2) 100 MG TABS tablet Take 100 mg by mouth daily.   No facility-administered medications prior to visit.   Reviewed past medical and social history.  Review of Systems Per HPI     Objective:    Physical Exam Vitals reviewed.  Cardiovascular:     Rate and Rhythm: Normal rate.     Pulses: Normal pulses.          Dorsalis pedis pulses are 2+ on the left side.  Pulmonary:     Effort:  Pulmonary effort is normal.  Musculoskeletal:     Right lower leg: No edema.     Left lower leg: Normal. No edema.     Left ankle: Normal.     Left foot: Normal. Normal range of motion. No deformity, bunion or Charcot foot.  Feet:     Left foot:     Skin integrity: Skin integrity normal.     Toenail Condition: Left toenails are normal.  Skin:    General: Skin is warm and dry.     Findings: No bruising or erythema.  Neurological:     Mental Status: She is alert and oriented to person, place, and time.   BP 122/82 (BP Location: Left Arm, Patient Position: Sitting, Cuff Size: Normal)   Pulse (!) 134   Temp (!) 97.4 F (36.3 C) (Temporal)   Ht 5\' 4"  (1.626 m)   Wt 233 lb 9.6 oz (106 kg)   SpO2 98%   BMI 40.10 kg/m    No results found for any visits on 03/07/22.     Assessment & Plan:   Problem List Items Addressed This Visit       Other   Left foot pain - Primary  Symptoms possibly due to metatarsalgia.  Advised about importance of foot wear  at all times, wearing shoes with adequate support, and avoid slippers and/flats. Continue use of ibuprofen as needed with food. F/up as needed Consider ref to podiatry if does not continue to improve.  No orders of the defined types were placed in this encounter.  Return in about 4 weeks (around 04/04/2022) for CPE (fasting).    Alysia Penna, NP

## 2022-03-11 DIAGNOSIS — R69 Illness, unspecified: Secondary | ICD-10-CM | POA: Diagnosis not present

## 2022-03-11 DIAGNOSIS — F603 Borderline personality disorder: Secondary | ICD-10-CM | POA: Diagnosis not present

## 2022-03-11 DIAGNOSIS — F321 Major depressive disorder, single episode, moderate: Secondary | ICD-10-CM | POA: Diagnosis not present

## 2022-04-30 ENCOUNTER — Encounter (INDEPENDENT_AMBULATORY_CARE_PROVIDER_SITE_OTHER): Payer: Self-pay | Admitting: Family

## 2022-04-30 ENCOUNTER — Telehealth (INDEPENDENT_AMBULATORY_CARE_PROVIDER_SITE_OTHER): Payer: 59 | Admitting: Family

## 2022-04-30 VITALS — Wt 210.0 lb

## 2022-04-30 DIAGNOSIS — G40B09 Juvenile myoclonic epilepsy, not intractable, without status epilepticus: Secondary | ICD-10-CM

## 2022-04-30 DIAGNOSIS — F410 Panic disorder [episodic paroxysmal anxiety] without agoraphobia: Secondary | ICD-10-CM

## 2022-04-30 DIAGNOSIS — F4010 Social phobia, unspecified: Secondary | ICD-10-CM | POA: Diagnosis not present

## 2022-04-30 DIAGNOSIS — R69 Illness, unspecified: Secondary | ICD-10-CM | POA: Diagnosis not present

## 2022-04-30 NOTE — Patient Instructions (Signed)
It was a pleasure to see you today!  Instructions for you until your next appointment are as follows: Be sure to follow up with your psychiatrist and therapist as we discussed today Continue taking the Levetiracetam as prescribed Send me MyChart messages when you have seizures Please sign up for MyChart if you have not done so. Please plan to return for follow up in 6 weeks or sooner if needed.  Feel free to contact our office during normal business hours at [phone] with questions or concerns. If there is no answer or the call is outside business hours, please leave a message and our clinic staff will call you back within the next business day.  If you have an urgent concern, please stay on the line for our after-hours answering service and ask for the on-call neurologist.     I also encourage you to use MyChart to communicate with me more directly. If you have not yet signed up for MyChart within [facility], the front desk staff can help you. However, please note that this inbox is NOT monitored on nights or weekends, and response can take up to 2 business days.  Urgent matters should be discussed with the on-call pediatric neurologist.   At [facility], we are committed to providing exceptional care. You will receive a patient satisfaction survey through text or email regarding your visit today. Your opinion is important to me. Comments are appreciated.

## 2022-04-30 NOTE — Progress Notes (Signed)
This is a Pediatric Specialist E-Visit consult/follow up provided via My Chart [name] consented to an E-Visit consult today.  Location of patient: Alison Brock is at home Location of provider: Elveria Rising, NP-C is at office Patient was referred by Nche, Bonna Gains, NP   The following participants were involved in this E-Visit: CMA, NP and patient  This visit was done via VIDEO   Chief Complain/ Reason for E-Visit today: seizure and anxiety follow up Total time on call: 20 min Follow up: 6 weeks      Erin Obando   MRN:  [id number]  17-Sep-2001   Provider: Elveria Rising NP-C Location of Care: [facility]  Visit type: Video return visit   Last visit: 10/31/2021  Referral source: Anne Ng, NP  History from: Epic chart and patient  Brief history:  Copied from previous record: History of generalized seizure disorder based on clinical seizure activity and EEG, as well as headaches, anxiety and panic. She is taking and tolerating Levetiracetam XR for her seizure disorder and has remained seizure free since March 2020. She is taking and tolerating Amitriptyline for migraine prevention. Alison Brock is seeing Dr Alison Brock for anxiety and panic and is taking Buspar and Lorazepam  Today's concerns: Alison Brock reports today that she has experienced significant increase in anxiety, and has had some seizures triggered by anxiety. She estimates that she has a panic attack at least once per week. She had an event on April 22, 2022 that triggered a seizure event. With that she was standing with other people and that her legs began to feel weak, she felt hot, her heart was racing, she felt disoriented and her stomach felt "weird". She said that she had loss of awareness and afterwards her head felt as if it was burning. She said that when she has seizures her stomach feels weird and her head burns afterwards but that these symptoms do not occur when she only has panic  events.   Alison Brock estimates that either panic attacks or seizures can last 10-15 minutes before she feels back at baseline. She says that when she feels anxiety starting that she can sometimes reduce the severity of the event by applying ice packs to her face and head. She says that she is compliant with her medication. She has problems going to sleep because of anxiety but then sleeps late the following day.  Alison Brock also feels that she has developed agoraphobia and has trouble leaving her home for any reason. She has been forced to leave stores as well as cancel trips because she is intolerant of being in public places. Alison Brock feels that the agoraphobia was triggered when she had a bout of infectious mononucleosis a few months ago and was restricted from going out while she was ill. She is not working or attending school because of her problems with anxiety. She has not been driving because of panic and seizures.    Alison Brock has been seeing a therapist but has to change providers because of funding. She is applying for a grant and hopes to be assigned to a new therapist soon. Alison Brock has an appointment with her psychiatrist next week and plans to ask to change the Buspar prescription as she does not feel that it is working as it once did.  Alison Brock has been otherwise generally healthy since she was last seen. She has no other health concerns today other than previously mentioned.  Review of systems: Please see HPI for neurologic and other pertinent  review of systems. Otherwise all other systems were reviewed and were negative.  Problem List: Patient Active Problem List   Diagnosis Date Noted   Left foot pain 03/07/2022   Encounter for long-term current use of medication 11/18/2020   Pyelonephritis 06/02/2019   Migraine variants, not intractable 02/09/2019   Panic disorder 12/25/2018   Social anxiety disorder 10/06/2018   Frequent headaches 07/08/2016   Anxiety 06/26/2016   Nonintractable juvenile  myoclonic epilepsy without status epilepticus (HCC) 06/26/2016   Seizure disorder (HCC) 06/26/2016   AP (abdominal pain) 06/02/2016   Loss of weight 06/02/2016   Constipation 06/02/2016     Past Medical History:  Diagnosis Date   Anxiety    Seizures (HCC)     Past medical history comments: See HPI  Surgical history: Past Surgical History:  Procedure Laterality Date   GUM SURGERY  03/2016     Family history: family history includes Anxiety disorder in her maternal aunt; Bipolar disorder in her maternal aunt; Depression in her paternal grandfather; Hypertension in her maternal grandmother, mother, and paternal grandfather.   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 She is not currently working.    Lives with her mother.   Social Determinants of Health   Financial Resource Strain: Unknown (06/02/2019)   Overall Financial Resource Strain (CARDIA)    Difficulty of Paying Living Expenses: Patient refused  Food Insecurity: Unknown (06/02/2019)   Hunger Vital Sign    Worried About Running Out of Food in the Last Year: Patient refused    Ran Out of Food in the Last Year: Patient refused  Transportation Needs: Unknown (06/02/2019)   PRAPARE - Transportation    Lack of Transportation (Medical): Patient refused    Lack of Transportation (Non-Medical):  Patient refused  Physical Activity: Unknown (06/02/2019)   Exercise Vital Sign    Days of Exercise per Week: Patient refused    Minutes of Exercise per Session: Patient refused  Stress: Unknown (06/02/2019)   [employer] of Occupational Health - Occupational Stress Questionnaire    Feeling of Stress : Patient refused  Social Connections: Unknown (06/02/2019)   Social Connection and Isolation Panel [NHANES]    Frequency of Communication with Friends and Family: Patient refused    Frequency of Social Gatherings with Friends and Family: Patient refused    Attends Religious Services: Patient refused    Active Member of Clubs or Organizations: Patient refused    Attends Banker Meetings: Patient refused    Marital Status: Patient refused  Intimate Partner Violence: Unknown (06/02/2019)   Humiliation, Afraid, Rape, and Kick questionnaire    Fear of Current or Ex-Partner: Patient refused    Emotionally Abused: Patient refused  Physically Abused: Patient refused    Sexually Abused: Patient refused    Past/failed meds: Copied from previous record: Topiramate ER - worsened anxiety and panic   Allergies: Allergies  Allergen Reactions   Sulfamethoxazole-Trimethoprim Nausea And Vomiting   Bentyl [Dicyclomine Hcl]    Doxycycline Other (See Comments)   Grapefruit Extract Other (See Comments)    Pt is not supposed to eat grapefruit with medication    Other     Seasonal Allergies - Spring and Fall   Topiramate Er     Immunizations: Immunization History  Administered Date(s) Administered   PFIZER(Purple Top)SARS-COV-2 Vaccination 07/07/2020, 08/04/2020   Tdap 01/10/2013     Diagnostics/Screenings: Copied from previous record: EEG 04/29/18 - This EEG is abnormal due to episodes of generalized discharges, more frontally predominant throughout the recording. The findings consistent with generalized seizure disorder, possibly juvenile myoclonic epilepsy, associated with  lower seizure threshold and require careful clinical correlation   Physical Exam: Wt 210 lb (95.3 kg)   BMI 36.05 kg/m   Examination limited by video format. General: Well developed, well nourished  obese young woman, seated at her home, in no evident distress Head: Head normocephalic and atraumatic.  Neck: Supple Musculoskeletal: No obvious deformities or scoliosis Skin: No rashes or neurocutaneous lesions  Neurologic Exam Mental Status: Awake and fully alert.  Oriented to place and time.  Recent and remote memory intact.  Attention span, concentration, and fund of knowledge appropriate.  Mood and affect appropriate. Cranial Nerves: Extraocular movements full without nystagmus. Hearing intact and symmetric to on video.  Facial sensation intact.  Face tongue, palate move normally and symmetrically.  Motor: Normal functional bulk, tone and strength Sensory: Intact to touch and temperature in all extremities.  Coordination: Finger-to-nose performed accurately bilaterally.  Gait and Station: Arises from chair without difficulty.  Stance is normal. Gait demonstrates normal stride length and balance.   .   Impression: Nonintractable juvenile myoclonic epilepsy without status epilepticus (HCC)  Panic disorder  Social anxiety disorder   Recommendations for plan of care: The patient's previous Epic records were reviewed. Alison Brock has neither had nor required imaging or lab studies since the last visit, other than what was performed by other providers. She has ongoing problems with anxiety and panic, and says that she sometimes has seizures that are triggered by panic. It is not clear if these are epileptic in nature. Alison Brock is not driving and I instructed her not to do so. She is in the midst of changing therapists and I encouraged her to be sure to get that accomplished. She is seeing her psychiatrist next week and it is my hope that she can make a new treatment plan for her mood disorder. I  instructed [name] to continue to take the Levetiracetam as prescribed for now. I asked her to keep track of seizure events and to let me know by MyChart when seizures occur. If these events are frequent, we may need to perform an at home ambulatory EEG to better evaluate the events. She may need rescue medication if the events are epileptic in nature. I will see Alison Brock in follow up in 6 weeks or sooner if needed. She agreed with the plans made today.   The medication list was reviewed and reconciled. No changes were made in the prescribed medications today. A complete medication list was provided to the patient.   Return in about 6 weeks (around 06/11/2022).   Allergies as of 04/30/2022       Reactions  Sulfamethoxazole-trimethoprim Nausea And Vomiting   Bentyl [dicyclomine Hcl]    Doxycycline Other (See Comments)   Grapefruit Extract Other (See Comments)   Pt is not supposed to eat grapefruit with medication    Other    Seasonal Allergies - Spring and Fall   Topiramate Er         Medication List        Accurate as of April 30, 2022 11:59 PM. If you have any questions, ask your nurse or doctor.          amitriptyline 25 MG tablet Commonly known as: ELAVIL TAKE TWO TABLETS BY MOUTH EVERY NIGHT AT BEDTIME   busPIRone 15 MG tablet Commonly known as: BUSPAR Take 15 mg by mouth 2 (two) times daily.   levETIRAcetam 750 MG 24 hr tablet Commonly known as: KEPPRA XR TAKE ONE TABLET BY MOUTH EVERY MORNING AND TAKE TWO TABLETS BY MOUTH EVERY NIGHT AT BEDTIME   Lo Loestrin Fe 1 MG-10 MCG / 10 MCG tablet Generic drug: Norethindrone-Ethinyl Estradiol-Fe Biphas Take 1 tablet by mouth daily.   LORazepam 0.5 MG tablet Commonly known as: ATIVAN TAKE 1/2 TO 1 (ONE-HALF TO ONE) TABLET BY MOUTH ONCE DAILY AS NEEDED FOR ANXIETY What changed: See the new instructions.   Magnesium Oxide -Mg Supplement 500 MG Tabs Take 500 mg by mouth daily.   pyridOXINE 100 MG tablet Commonly known as:  VITAMIN B-6 Take 100 mg by mouth daily.   riboflavin 100 MG Tabs tablet Commonly known as: VITAMIN B-2 Take 100 mg by mouth daily.      Total time spent with the patient was 20 minutes, of which 50% or more was spent in counseling and coordination of care.  Elveria Rising NP-C [facility] Ph. [phone] Fax [phone]

## 2022-05-04 ENCOUNTER — Encounter (INDEPENDENT_AMBULATORY_CARE_PROVIDER_SITE_OTHER): Payer: Self-pay | Admitting: Family

## 2022-05-06 ENCOUNTER — Encounter (INDEPENDENT_AMBULATORY_CARE_PROVIDER_SITE_OTHER): Payer: Self-pay

## 2022-05-06 DIAGNOSIS — R69 Illness, unspecified: Secondary | ICD-10-CM | POA: Diagnosis not present

## 2022-05-06 DIAGNOSIS — F603 Borderline personality disorder: Secondary | ICD-10-CM | POA: Diagnosis not present

## 2022-05-06 DIAGNOSIS — F321 Major depressive disorder, single episode, moderate: Secondary | ICD-10-CM | POA: Diagnosis not present

## 2022-05-19 ENCOUNTER — Encounter: Payer: 59 | Admitting: Nurse Practitioner

## 2022-05-21 DIAGNOSIS — R69 Illness, unspecified: Secondary | ICD-10-CM | POA: Diagnosis not present

## 2022-05-28 ENCOUNTER — Encounter (INDEPENDENT_AMBULATORY_CARE_PROVIDER_SITE_OTHER): Payer: Self-pay

## 2022-05-28 DIAGNOSIS — R69 Illness, unspecified: Secondary | ICD-10-CM | POA: Diagnosis not present

## 2022-05-28 NOTE — Telephone Encounter (Signed)
I called and spoke with Alison Brock. She said that she had a bad panic attack last night and that when EMS recommended taking 1/2 tablet of Ativan that helped her to be more calm. She has been taking Clonazepam, prescribed by Dr Jannifer Franklin, but feels that it is making her more depressed. She tried to call that provider's office today but received a message that the office was closed, and no instructions for what to do for emergencies. She called [facility] and left a message for their outpatient provider. She has an appointment with Dr Jannifer Franklin next week, and has an appointment with her therapist today. She asked if she should stop the Clonazepam and I recommended that she continue the once per day dose for now, until she can talk with a behavioral health provider. I told her that it was ok for her to take 1/2 Ativan tablet once per day until she can talk with Dr Jannifer Franklin. She agreed with this plan. TG

## 2022-05-29 ENCOUNTER — Telehealth (INDEPENDENT_AMBULATORY_CARE_PROVIDER_SITE_OTHER): Payer: Self-pay | Admitting: Family

## 2022-05-29 DIAGNOSIS — F411 Generalized anxiety disorder: Secondary | ICD-10-CM

## 2022-05-29 MED ORDER — LORAZEPAM 0.5 MG PO TABS: ORAL_TABLET | ORAL | 0 refills | Status: AC

## 2022-05-29 NOTE — Telephone Encounter (Signed)
I called and spoke with Alison Brock. She said that she has still not been able to get in touch with Dr Gloris [place] office, despite multiple calls. She saw her therapist yesterday who recommended contacting [facility], which she did, and has an appointment next Weds with Dr Joni Reining Habit. She talked with that office yesterday about increasing depression on Clonazepam and they recommended stopping it. Alison Brock asked for directions to taper off that medication. The initial instructions were to take Clonazepam 0.5mg  1/2 BID. She took 1/2 tablet yesterday morning and 1/4 tablet yesterday evening. She took 1/2 tablet this morning. I recommended none this evening, and 1/4 tablet in the morning, then stopping the Clonazepam. She said that she was advised to take Ativan 0.5mg  1/2 tablet TID until her appointment next week, but needs a refill in order to get to that appointment. Since she is unable to reach Dr Gloris [place] office, she asked for enough medication to last until [facility] 06/04/22. I agreed to send in that Rx and encouraged her to keep the appointment next week with Dr Habit. TG

## 2022-05-29 NOTE — Telephone Encounter (Signed)
Who's calling (name and relationship to patient) : [name]; self    Best contact number: [phone]  Provider they see: Blane Ohara, NP  Reason for call: Called in wanting to speak with Alison Brock, at least before the end of the day.   Call ID:      PRESCRIPTION REFILL ONLY  Name of prescription:  Pharmacy:

## 2022-05-29 NOTE — Telephone Encounter (Signed)
See phone message from today. TG

## 2022-06-04 ENCOUNTER — Encounter (INDEPENDENT_AMBULATORY_CARE_PROVIDER_SITE_OTHER): Payer: Self-pay

## 2022-06-04 DIAGNOSIS — F4001 Agoraphobia with panic disorder: Secondary | ICD-10-CM | POA: Diagnosis not present

## 2022-06-04 DIAGNOSIS — F331 Major depressive disorder, recurrent, moderate: Secondary | ICD-10-CM | POA: Diagnosis not present

## 2022-06-04 DIAGNOSIS — R69 Illness, unspecified: Secondary | ICD-10-CM | POA: Diagnosis not present

## 2022-06-09 DIAGNOSIS — R69 Illness, unspecified: Secondary | ICD-10-CM | POA: Diagnosis not present

## 2022-06-09 DIAGNOSIS — F331 Major depressive disorder, recurrent, moderate: Secondary | ICD-10-CM | POA: Diagnosis not present

## 2022-06-09 DIAGNOSIS — F4001 Agoraphobia with panic disorder: Secondary | ICD-10-CM | POA: Diagnosis not present

## 2022-06-11 ENCOUNTER — Telehealth (INDEPENDENT_AMBULATORY_CARE_PROVIDER_SITE_OTHER): Payer: 59 | Admitting: Family

## 2022-06-11 ENCOUNTER — Encounter (INDEPENDENT_AMBULATORY_CARE_PROVIDER_SITE_OTHER): Payer: Self-pay | Admitting: Family

## 2022-06-11 VITALS — Wt 217.0 lb

## 2022-06-11 DIAGNOSIS — F419 Anxiety disorder, unspecified: Secondary | ICD-10-CM

## 2022-06-11 DIAGNOSIS — G40B09 Juvenile myoclonic epilepsy, not intractable, without status epilepticus: Secondary | ICD-10-CM | POA: Diagnosis not present

## 2022-06-11 DIAGNOSIS — R69 Illness, unspecified: Secondary | ICD-10-CM | POA: Diagnosis not present

## 2022-06-11 DIAGNOSIS — F410 Panic disorder [episodic paroxysmal anxiety] without agoraphobia: Secondary | ICD-10-CM

## 2022-06-11 NOTE — Progress Notes (Unsigned)
This is a Pediatric Specialist E-Visit consult/follow up provided via My Chart Alison Brock and their parent/guardian *** (name of consenting adult) consented to an E-Visit consult today.  Location of patient: Alison Brock is at *** (location) Location of provider: Carlyle Lipa is at *** (location) Patient was referred by Nche, Bonna Gains, NP   The following participants were involved in this E-Visit: *** (list of participants and their roles)  This visit was done via VIDEO   Chief Complain/ Reason for E-Visit today: *** Total time on call: *** Follow up: ***   Alison Brock   MRN:  [id number]  [birth year]   Provider: Elveria Rising NP-C Location of Care: [facility]  Visit type: Return visit  Last visit: 04/30/2022  Referral source: Anne Ng, NP  History from: Epic chart, patient  Brief history:  Copied from previous record: History of generalized seizure disorder based on clinical seizure activity and EEG, as well as headaches, anxiety and panic. She is taking and tolerating Levetiracetam XR for her seizure disorder and has remained seizure free since March 2020. She is taking and tolerating Amitriptyline for migraine prevention. Alison Brock is seeing Dr Jannifer Franklin for anxiety and panic and is taking Buspar and Lorazepam  Today's concerns:  *** has been otherwise generally healthy since she was last seen. Neither *** nor mother have other health concerns for *** today other than previously mentioned.   Review of systems: Please see HPI for neurologic and other pertinent review of systems. Otherwise all other systems were reviewed and were negative.  Problem List: Patient Active Problem List   Diagnosis Date Noted   Left foot pain 03/07/2022   Encounter for long-term current use of medication 11/18/2020   Pyelonephritis 06/02/2019   Migraine variants, not intractable 02/09/2019   Panic disorder 12/25/2018   Social anxiety disorder  10/06/2018   Frequent headaches 07/08/2016   Anxiety 06/26/2016   Nonintractable juvenile myoclonic epilepsy without status epilepticus (HCC) 06/26/2016   Seizure disorder (HCC) 06/26/2016   AP (abdominal pain) 06/02/2016   Loss of weight 06/02/2016   Constipation 06/02/2016     Past Medical History:  Diagnosis Date   Anxiety    Seizures (HCC)     Past medical history comments: See HPI Copied from previous record:   Surgical history: Past Surgical History:  Procedure Laterality Date   GUM SURGERY  03/2016     Family history: family history includes Anxiety disorder in her maternal aunt; Bipolar disorder in her maternal aunt; Depression in her paternal grandfather; Hypertension in her maternal grandmother, mother, and paternal grandfather.   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 She is not currently working.  Lives with her mother.   Social Determinants of Health   Financial Resource Strain: Unknown (06/02/2019)   Overall Financial Resource Strain (CARDIA)    Difficulty of Paying Living Expenses: Patient refused  Food Insecurity: Unknown (06/02/2019)   Hunger Vital Sign    Worried About [place] in the Last Year: Patient refused    [place] in the Last Year: Patient refused  Transportation Needs: Unknown  (06/02/2019)   PRAPARE - Hydrologist (Medical): Patient refused    Lack of Transportation (Non-Medical): Patient refused  Physical Activity: Unknown (06/02/2019)   Exercise Vital Sign    Days of Exercise per Week: Patient refused    Minutes of Exercise per Session: Patient refused  Stress: Unknown (06/02/2019)   [place] of Stress : Patient refused  Social Connections: Unknown (06/02/2019)   Social Connection and Isolation Panel [NHANES]    Frequency of Communication with Friends and Family: Patient refused    Frequency of Social Gatherings with Friends and Family: Patient refused    Attends Religious Services: Patient refused    Active Member of Clubs or Organizations: Patient refused    Attends Archivist Meetings: Patient refused    Marital Status: Patient refused  Intimate Partner Violence: Unknown (06/02/2019)   Humiliation, Afraid, Rape, and Kick questionnaire    Fear of Current or Ex-Partner: Patient refused    Emotionally Abused: Patient refused    Physically Abused: Patient refused    Sexually Abused: Patient refused      Past/failed meds: Copied from previous record: Topiramate ER - worsened anxiety and panic  Allergies: Allergies  Allergen Reactions   Sulfamethoxazole-Trimethoprim Nausea And Vomiting   Bentyl [Dicyclomine Hcl]    Doxycycline Other (See Comments)   Grapefruit Extract Other (See Comments)    Pt is not supposed to eat grapefruit with medication    Other     Seasonal Allergies - Spring and Fall   Topiramate Er     Immunizations: Immunization History  Administered Date(s) Administered   PFIZER(Purple Top)SARS-COV-2 Vaccination 07/07/2020, 08/04/2020   Tdap 01/10/2013    Diagnostics/Screenings: Copied from previous record: EEG 04/29/18 - This EEG is abnormal due to episodes of generalized discharges, more frontally predominant  throughout the recording. The findings consistent with generalized seizure disorder, possibly juvenile myoclonic epilepsy, associated with lower seizure threshold and require careful clinical correlation  Physical Exam: There were no vitals taken for this visit.  General: Well developed, well nourished, seated, in no evident distress Head: Head normocephalic and atraumatic.  Oropharynx benign. Neck: Supple Cardiovascular: Regular rate and rhythm, no murmurs Respiratory: Breath sounds clear to auscultation Musculoskeletal: No obvious deformities or scoliosis Skin: No rashes or neurocutaneous lesions  Neurologic Exam Mental Status: Awake and fully alert.  Oriented to place and time.  Recent and remote memory intact.  Attention span, concentration, and fund of knowledge appropriate.  Mood and affect appropriate. Cranial Nerves: Fundoscopic exam reveals sharp disc margins.  Pupils equal, briskly reactive to light.  Extraocular movements full without nystagmus. Hearing intact and symmetric to whisper.  Facial sensation intact.  Face tongue, palate move normally and symmetrically. Shoulder shrug normal Motor: Normal bulk and tone. Normal strength in all tested extremity muscles. Sensory: Intact to touch and temperature in all extremities.  Coordination: Rapid alternating movements normal in all extremities.  Finger-to-nose and heel-to shin performed accurately bilaterally.  Romberg negative. Gait  and Station: Arises from chair without difficulty.  Stance is normal. Gait demonstrates normal stride length and balance.   Able to heel, toe and tandem walk without difficulty. Reflexes: 1+ and symmetric. Toes downgoing.   Impression: No diagnosis found.    Recommendations for plan of care: The patient's previous Epic records were reviewed. Alison Brock has neither had nor required imaging or lab studies since the last visit.   The medication list was reviewed and reconciled. No changes were made in the  prescribed medications today. A complete medication list was provided to the patient.  No orders of the defined types were placed in this encounter.   No follow-ups on file.   Allergies as of 06/11/2022       Reactions   Sulfamethoxazole-trimethoprim Nausea And Vomiting   Bentyl [dicyclomine Hcl]    Doxycycline Other (See Comments)   Grapefruit Extract Other (See Comments)   Pt is not supposed to eat grapefruit with medication    Other    Seasonal Allergies - Spring and Fall   Topiramate Er         Medication List        Accurate as of June 11, 2022  6:24 AM. If you have any questions, ask your nurse or doctor.          amitriptyline 25 MG tablet Commonly known as: ELAVIL TAKE TWO TABLETS BY MOUTH EVERY NIGHT AT BEDTIME   busPIRone 15 MG tablet Commonly known as: BUSPAR Take 15 mg by mouth 2 (two) times daily.   levETIRAcetam 750 MG 24 hr tablet Commonly known as: KEPPRA XR TAKE ONE TABLET BY MOUTH EVERY MORNING AND TAKE TWO TABLETS BY MOUTH EVERY NIGHT AT BEDTIME   Lo Loestrin Fe 1 MG-10 MCG / 10 MCG tablet Generic drug: Norethindrone-Ethinyl Estradiol-Fe Biphas Take 1 tablet by mouth daily.   LORazepam 0.5 MG tablet Commonly known as: ATIVAN Take 1/2 tablet 3 times per day for anxiety   Magnesium Oxide -Mg Supplement 500 MG Tabs Take 500 mg by mouth daily.   pyridOXINE 100 MG tablet Commonly known as: VITAMIN B6 Take 100 mg by mouth daily.   riboflavin 100 MG Tabs tablet Commonly known as: VITAMIN B-2 Take 100 mg by mouth daily.            I discussed this patient's care with the multiple providers involved in his care today to develop this assessment and plan.   Total time spent with the patient was *** minutes, of which 50% or more was spent in counseling and coordination of care.  Elveria Rising NP-C [facility] Ph. [phone] Fax [phone]

## 2022-06-12 ENCOUNTER — Encounter (INDEPENDENT_AMBULATORY_CARE_PROVIDER_SITE_OTHER): Payer: Self-pay | Admitting: Family

## 2022-06-12 DIAGNOSIS — R69 Illness, unspecified: Secondary | ICD-10-CM | POA: Diagnosis not present

## 2022-06-12 NOTE — Patient Instructions (Signed)
It was a pleasure to see you today!  Instructions for you until your next appointment are as follows: Continue taking the Levetiracetam XR as prescribed Continue close follow up with Dr Habit for your anxiety and panic Please sign up for MyChart if you have not done so. Please plan to return for follow up in 2 months or sooner if needed.  Feel free to contact our office during normal business hours at [phone] with questions or concerns. If there is no answer or the call is outside business hours, please leave a message and our clinic staff will call you back within the next business day.  If you have an urgent concern, please stay on the line for our after-hours answering service and ask for the on-call neurologist.     I also encourage you to use MyChart to communicate with me more directly. If you have not yet signed up for MyChart within [facility], the front desk staff can help you. However, please note that this inbox is NOT monitored on nights or weekends, and response can take up to 2 business days.  Urgent matters should be discussed with the on-call pediatric neurologist.   At [facility], we are committed to providing exceptional care. You will receive a patient satisfaction survey through text or email regarding your visit today. Your opinion is important to me. Comments are appreciated.

## 2022-06-16 DIAGNOSIS — R69 Illness, unspecified: Secondary | ICD-10-CM | POA: Diagnosis not present

## 2022-06-19 DIAGNOSIS — R69 Illness, unspecified: Secondary | ICD-10-CM | POA: Diagnosis not present

## 2022-06-20 DIAGNOSIS — F4001 Agoraphobia with panic disorder: Secondary | ICD-10-CM | POA: Diagnosis not present

## 2022-06-20 DIAGNOSIS — F331 Major depressive disorder, recurrent, moderate: Secondary | ICD-10-CM | POA: Diagnosis not present

## 2022-06-20 DIAGNOSIS — R69 Illness, unspecified: Secondary | ICD-10-CM | POA: Diagnosis not present

## 2022-06-23 DIAGNOSIS — R69 Illness, unspecified: Secondary | ICD-10-CM | POA: Diagnosis not present

## 2022-06-26 DIAGNOSIS — R69 Illness, unspecified: Secondary | ICD-10-CM | POA: Diagnosis not present

## 2022-06-30 DIAGNOSIS — R69 Illness, unspecified: Secondary | ICD-10-CM | POA: Diagnosis not present

## 2022-07-03 DIAGNOSIS — R69 Illness, unspecified: Secondary | ICD-10-CM | POA: Diagnosis not present

## 2022-07-04 DIAGNOSIS — F4001 Agoraphobia with panic disorder: Secondary | ICD-10-CM | POA: Diagnosis not present

## 2022-07-04 DIAGNOSIS — R69 Illness, unspecified: Secondary | ICD-10-CM | POA: Diagnosis not present

## 2022-07-04 DIAGNOSIS — F331 Major depressive disorder, recurrent, moderate: Secondary | ICD-10-CM | POA: Diagnosis not present

## 2022-07-07 DIAGNOSIS — R69 Illness, unspecified: Secondary | ICD-10-CM | POA: Diagnosis not present

## 2022-07-10 DIAGNOSIS — R69 Illness, unspecified: Secondary | ICD-10-CM | POA: Diagnosis not present

## 2022-07-11 ENCOUNTER — Other Ambulatory Visit (INDEPENDENT_AMBULATORY_CARE_PROVIDER_SITE_OTHER): Payer: Self-pay | Admitting: Family

## 2022-07-11 DIAGNOSIS — G40B09 Juvenile myoclonic epilepsy, not intractable, without status epilepticus: Secondary | ICD-10-CM

## 2022-07-14 DIAGNOSIS — R69 Illness, unspecified: Secondary | ICD-10-CM | POA: Diagnosis not present

## 2022-07-17 DIAGNOSIS — R69 Illness, unspecified: Secondary | ICD-10-CM | POA: Diagnosis not present

## 2022-07-18 ENCOUNTER — Telehealth (INDEPENDENT_AMBULATORY_CARE_PROVIDER_SITE_OTHER): Payer: Self-pay | Admitting: Family

## 2022-07-18 DIAGNOSIS — G40909 Epilepsy, unspecified, not intractable, without status epilepticus: Secondary | ICD-10-CM

## 2022-07-18 DIAGNOSIS — F4010 Social phobia, unspecified: Secondary | ICD-10-CM

## 2022-07-18 DIAGNOSIS — R69 Illness, unspecified: Secondary | ICD-10-CM | POA: Diagnosis not present

## 2022-07-18 DIAGNOSIS — F411 Generalized anxiety disorder: Secondary | ICD-10-CM | POA: Diagnosis not present

## 2022-07-18 DIAGNOSIS — F410 Panic disorder [episodic paroxysmal anxiety] without agoraphobia: Secondary | ICD-10-CM | POA: Diagnosis not present

## 2022-07-18 NOTE — Telephone Encounter (Signed)
Contacted pt.  Verified pt's DOB. Psychiatrist is switching to a different location. Patient would like a referral sent to the new location.   [place], [place]. And the Psychiatrists name is Leta Jungling.   SS, CCMA

## 2022-07-18 NOTE — Telephone Encounter (Signed)
  Name of who is calling: Alison Brock Relationship to Patient: Self  Best contact number: [phone]  Provider they see: Rockwell Germany  Reason for call: [name] is calling to speak with provider. She's requesting a callback     [place]  Name of prescription:  Pharmacy:

## 2022-07-21 ENCOUNTER — Other Ambulatory Visit (INDEPENDENT_AMBULATORY_CARE_PROVIDER_SITE_OTHER): Payer: Self-pay | Admitting: Family

## 2022-07-21 DIAGNOSIS — R519 Headache, unspecified: Secondary | ICD-10-CM

## 2022-07-21 DIAGNOSIS — R69 Illness, unspecified: Secondary | ICD-10-CM | POA: Diagnosis not present

## 2022-07-22 ENCOUNTER — Encounter (INDEPENDENT_AMBULATORY_CARE_PROVIDER_SITE_OTHER): Payer: Self-pay

## 2022-07-22 MED ORDER — LOW-OGESTREL 0.3-30 MG-MCG PO TABS
1.0000 | ORAL_TABLET | Freq: Every day | ORAL | 2 refills | Status: DC
Start: 2022-07-22 — End: 2022-10-15

## 2022-07-24 DIAGNOSIS — R69 Illness, unspecified: Secondary | ICD-10-CM | POA: Diagnosis not present

## 2022-07-28 DIAGNOSIS — R69 Illness, unspecified: Secondary | ICD-10-CM | POA: Diagnosis not present

## 2022-07-31 DIAGNOSIS — R69 Illness, unspecified: Secondary | ICD-10-CM | POA: Diagnosis not present

## 2022-08-04 DIAGNOSIS — R69 Illness, unspecified: Secondary | ICD-10-CM | POA: Diagnosis not present

## 2022-08-07 DIAGNOSIS — R69 Illness, unspecified: Secondary | ICD-10-CM | POA: Diagnosis not present

## 2022-08-11 DIAGNOSIS — R69 Illness, unspecified: Secondary | ICD-10-CM | POA: Diagnosis not present

## 2022-08-14 ENCOUNTER — Telehealth (INDEPENDENT_AMBULATORY_CARE_PROVIDER_SITE_OTHER): Payer: 59 | Admitting: Family

## 2022-08-14 DIAGNOSIS — R69 Illness, unspecified: Secondary | ICD-10-CM | POA: Diagnosis not present

## 2022-08-18 DIAGNOSIS — R69 Illness, unspecified: Secondary | ICD-10-CM | POA: Diagnosis not present

## 2022-08-21 ENCOUNTER — Encounter (INDEPENDENT_AMBULATORY_CARE_PROVIDER_SITE_OTHER): Payer: Self-pay

## 2022-08-21 DIAGNOSIS — R69 Illness, unspecified: Secondary | ICD-10-CM | POA: Diagnosis not present

## 2022-08-25 DIAGNOSIS — R69 Illness, unspecified: Secondary | ICD-10-CM | POA: Diagnosis not present

## 2022-08-28 DIAGNOSIS — F4001 Agoraphobia with panic disorder: Secondary | ICD-10-CM | POA: Diagnosis not present

## 2022-08-28 DIAGNOSIS — R69 Illness, unspecified: Secondary | ICD-10-CM | POA: Diagnosis not present

## 2022-08-28 DIAGNOSIS — F411 Generalized anxiety disorder: Secondary | ICD-10-CM | POA: Diagnosis not present

## 2022-09-07 NOTE — Progress Notes (Unsigned)
This is a [facility] E-Visit consult/follow up provided via My Chart Bhawna Burdine and her mother Samanthanicole Ibay consented to an E-Visit consult today.  Location of patient: [name] is at home Location of provider: Damita Dunnings is at office Patient was referred by Nche, Bonna Gains, NP   The following participants were involved in this E-Visit: LPN, patient and her mother  This visit was done via VIDEO   Chief Complain/ Reason for E-Visit today: seizure follow up Total time on call: 25 min Follow up: 4 months    Alison Brock   MRN:  [id number]  Apr 08, 2001   Provider: Elveria Rising NP-C Location of Care: [facility]  Visit type: Return visit  Last visit: 06/11/2022  Referral source: Anne Ng, NP History from: Epic chart, patient and her mother  Brief history:  Copied from previous record: History of generalized seizure disorder based on clinical seizure activity and EEG, as well as headaches, anxiety and panic. She is taking and tolerating Levetiracetam XR for her seizure disorder and has remained seizure free since April 22, 2022. She is taking and tolerating Amitriptyline for migraine prevention. [name] was seeing Dr Joni Reining Habit with [facility] for anxiety and panic. She is now seeing Myer Haff, DNP with [facility].  Today's concerns: Has remained seizure free Some increase in anxiety and panic. Fluoxetine dose was increased about 10 days ago. She has had some activating feelings from that but says it is getting better.  Is in the midst of changing to a new therapist Has increased panic when leaving the house. Unable to go to stores or medical appointments. Has been following recommendations by psychiatry to go outside her home or go for short rides in the car. Needs to see gynecology for oral contraceptive refill but cannot tolerate going to that office. Mom is working on a plan with the provider to  get her seen.  Has had some increased anxiety also related to relationship with her father and his family  [name] has been otherwise generally healthy since she was last seen. No health concerns today other than previously mentioned.  Review of systems: Please see HPI for neurologic and other pertinent review of systems. Otherwise all other systems were reviewed and were negative.  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   She is taking a gap year before college. She is not currently working.    Lives with her mother.   Social Determinants of Health   Financial Resource Strain: Unknown (06/02/2019)   Overall Financial Resource Strain (CARDIA)    Difficulty of Paying Living Expenses: Patient refused  Food Insecurity: Unknown (06/02/2019)   Hunger Vital Sign    Worried About [place] in the Last Year: Patient refused    [place] in the Last Year: Patient refused  Transportation Needs: Unknown (06/02/2019)   PRAPARE - Hydrologist (Medical): Patient refused    Lack of Transportation (Non-Medical): Patient refused  Physical Activity: Unknown (06/02/2019)   Exercise Vital Sign    Days of Exercise per Week: Patient refused    Minutes of Exercise per Session: Patient refused  Stress: Unknown (06/02/2019)   [place] of Stress : Patient refused  Social Connections: Unknown (06/02/2019)   Social Connection and Isolation Panel [NHANES]    Frequency of Communication with Friends and Family: Patient refused    Frequency of Social Gatherings with Friends and Family: Patient refused    Attends Religious Services: Patient refused    Active Member of Clubs or Organizations: Patient refused    Attends Archivist Meetings: Patient refused    Marital Status: Patient refused  Intimate Partner Violence: Unknown (06/02/2019)   Humiliation, Afraid, Rape, and Kick questionnaire    Fear of Current or Ex-Partner: Patient refused    Emotionally Abused: Patient refused    Physically Abused: Patient refused    Sexually Abused: Patient refused    Past/failed meds: Copied from previous record: Topiramate ER - worsened anxiety and panic   Allergies: Allergies   Allergen Reactions   Sulfamethoxazole-Trimethoprim Nausea And Vomiting   Bentyl [Dicyclomine Hcl]    Doxycycline Other (See Comments)   Grapefruit Extract Other (See Comments)    Pt is not supposed to eat grapefruit with medication    Other     Seasonal Allergies - Spring and Fall   Topiramate Er     Immunizations: Immunization History  Administered Date(s) Administered   PFIZER(Purple Top)SARS-COV-2 Vaccination 07/07/2020, 08/04/2020   Tdap 01/10/2013    Diagnostics/Screenings: Copied from previous record: 04/24/2021 - rEEG - This EEG is unremarkable during awake state. There were just a couple of single sharps in the bilateral frontal area which is not significant.  This is significant improvement compared to her initial EEGs with frequent generalized discharges. Please note that normal EEG does not exclude epilepsy, clinical correlation is indicated.  Teressa Lower, MD   04/29/18 - rEEG - This EEG is abnormal due to episodes of generalized discharges, more frontally predominant throughout the recording. The findings consistent with generalized seizure disorder, possibly juvenile myoclonic epilepsy, associated with lower seizure threshold and require careful clinical correlation  07/27/2016 - MRI Brain w/wo contrast - Artifact related to braces. Allowing for that, the examination is normal. No evidence of malformation. No evidence of acquired brain disease.  Physical Exam: Wt 217 lb (98.4 kg)   BMI 37.25 kg/m   Examination was limited by video format General: Well developed, well nourished young woman, seated at home, in no evident distress Head: Head normocephalic and atraumatic.  Neck: Supple Musculoskeletal: No obvious deformities or scoliosis Skin: No rashes or neurocutaneous lesions  Neurologic Exam Mental Status: Awake and fully alert.  Oriented to place and time.  Recent and remote memory intact.  Attention span, concentration, and fund of knowledge appropriate.  Mood  and affect appropriate. Cranial Nerves: Extraocular movements full without nystagmus. Hearing intact and symmetric on video.  Facial sensation intact.  Face tongue, palate move normally and symmetrically.  Motor: Normal functional bulk, tone and strength Sensory: Intact to touch and temperature in all extremities.  Coordination: Finger-to-nose performed accurately bilaterally. Gait and Station: Normal gait  Impression: Seizure disorder [facility])  Social anxiety disorder  Panic disorder   Recommendations for plan of care: The patient's previous Epic records were reviewed. No new diagnostic studies to review with the patient.  Plan until next visit: Continue medications as prescribed Recommended ongoing close follow up with psychiatry and therapist I will refill the oral contraceptive if needed until she can get in to see gynecology. Recommended working on plan to get that accomplished.  Call if seizures occur Return in about 4 months (around 01/08/2023).  The medication list was reviewed and reconciled. No changes were made in the prescribed medications today. A complete medication list was provided to the patient.  Allergies as of 09/09/2022       Reactions   Sulfamethoxazole-trimethoprim Nausea And Vomiting   Bentyl [dicyclomine Hcl]    Doxycycline Other (See Comments)   Grapefruit Extract Other (See Comments)   Pt is not supposed to eat grapefruit with medication    Other    Seasonal Allergies - Spring and Fall   Topiramate Er         Medication List        Accurate as of September 09, 2022  2:38 PM. If you have any questions, ask your nurse or doctor.          amitriptyline 25 MG tablet Commonly known as: ELAVIL TAKE TWO TABLETS BY MOUTH EVERY NIGHT AT BEDTIME   busPIRone 15 MG tablet Commonly known as: BUSPAR Take 15 mg by mouth 2 (two) times daily.   FLUoxetine 20 MG capsule Commonly known as: PROZAC Take 20 mg by mouth daily.   FLUoxetine 10 MG  capsule Commonly known as: PROZAC Take 10 mg by mouth daily.   levETIRAcetam 750 MG 24 hr tablet Commonly known as: KEPPRA XR TAKE ONE TABLET BY MOUTH EVERY MORNING AND TAKE TWO TABLETS BY MOUTH EVERY NIGHT AT BEDTIME   LORazepam 0.5 MG tablet Commonly known as: ATIVAN Take 1/2 tablet 3 times per day for anxiety What changed: additional instructions   Low-Ogestrel 0.3-30 MG-MCG tablet Generic drug: norgestrel-ethinyl estradiol Take 1 tablet by mouth daily.   Magnesium Oxide -Mg Supplement 500 MG Tabs Take 500 mg by mouth daily.   pyridOXINE 100 MG tablet Commonly known as: VITAMIN B6 Take 100 mg by mouth daily.   riboflavin 100 MG Tabs tablet Commonly known as: VITAMIN B-2 Take 100 mg by mouth daily.      Total time spent with the patient was 25 minutes, of  which 50% or more was spent in counseling and coordination of care.  Elveria Rising NP-C [facility] [id number] [place], [place] [address] Ph. [phone] Fax [phone]

## 2022-09-08 DIAGNOSIS — F4001 Agoraphobia with panic disorder: Secondary | ICD-10-CM | POA: Diagnosis not present

## 2022-09-08 DIAGNOSIS — F331 Major depressive disorder, recurrent, moderate: Secondary | ICD-10-CM | POA: Diagnosis not present

## 2022-09-08 DIAGNOSIS — F411 Generalized anxiety disorder: Secondary | ICD-10-CM | POA: Diagnosis not present

## 2022-09-08 DIAGNOSIS — R69 Illness, unspecified: Secondary | ICD-10-CM | POA: Diagnosis not present

## 2022-09-09 ENCOUNTER — Telehealth (INDEPENDENT_AMBULATORY_CARE_PROVIDER_SITE_OTHER): Payer: 59 | Admitting: Family

## 2022-09-09 ENCOUNTER — Encounter (INDEPENDENT_AMBULATORY_CARE_PROVIDER_SITE_OTHER): Payer: Self-pay | Admitting: Family

## 2022-09-09 VITALS — Wt 217.0 lb

## 2022-09-09 DIAGNOSIS — F410 Panic disorder [episodic paroxysmal anxiety] without agoraphobia: Secondary | ICD-10-CM

## 2022-09-09 DIAGNOSIS — R69 Illness, unspecified: Secondary | ICD-10-CM | POA: Diagnosis not present

## 2022-09-09 DIAGNOSIS — F4010 Social phobia, unspecified: Secondary | ICD-10-CM

## 2022-09-09 DIAGNOSIS — G40909 Epilepsy, unspecified, not intractable, without status epilepticus: Secondary | ICD-10-CM | POA: Diagnosis not present

## 2022-09-09 NOTE — Patient Instructions (Addendum)
It was a pleasure to see you today!  Instructions for you until your next appointment are as follows: Continue taking your medication as prescribed Continue working closely with your psychiatrist and therapist Work on a plan to get in to see your gynecologist as we discussed today Please sign up for MyChart if you have not done so. Please plan to return for follow up in 4 months or sooner if needed.  Feel free to contact our office during normal business hours at [phone] with questions or concerns. If there is no answer or the call is outside business hours, please leave a message and our clinic staff will call you back within the next business day.  If you have an urgent concern, please stay on the line for our after-hours answering service and ask for the on-call neurologist.     I also encourage you to use MyChart to communicate with me more directly. If you have not yet signed up for MyChart within [facility], the front desk staff can help you. However, please note that this inbox is NOT monitored on nights or weekends, and response can take up to 2 business days.  Urgent matters should be discussed with the on-call pediatric neurologist.   At [facility], we are committed to providing exceptional care. You will receive a patient satisfaction survey through text or email regarding your visit today. Your opinion is important to me. Comments are appreciated.

## 2022-09-15 DIAGNOSIS — F331 Major depressive disorder, recurrent, moderate: Secondary | ICD-10-CM | POA: Diagnosis not present

## 2022-09-15 DIAGNOSIS — F4001 Agoraphobia with panic disorder: Secondary | ICD-10-CM | POA: Diagnosis not present

## 2022-09-15 DIAGNOSIS — F411 Generalized anxiety disorder: Secondary | ICD-10-CM | POA: Diagnosis not present

## 2022-09-15 DIAGNOSIS — R69 Illness, unspecified: Secondary | ICD-10-CM | POA: Diagnosis not present

## 2022-09-18 ENCOUNTER — Encounter (INDEPENDENT_AMBULATORY_CARE_PROVIDER_SITE_OTHER): Payer: Self-pay

## 2022-09-22 DIAGNOSIS — R69 Illness, unspecified: Secondary | ICD-10-CM | POA: Diagnosis not present

## 2022-09-22 DIAGNOSIS — F411 Generalized anxiety disorder: Secondary | ICD-10-CM | POA: Diagnosis not present

## 2022-09-22 DIAGNOSIS — F410 Panic disorder [episodic paroxysmal anxiety] without agoraphobia: Secondary | ICD-10-CM | POA: Diagnosis not present

## 2022-09-22 DIAGNOSIS — F4001 Agoraphobia with panic disorder: Secondary | ICD-10-CM | POA: Diagnosis not present

## 2022-10-02 DIAGNOSIS — R69 Illness, unspecified: Secondary | ICD-10-CM | POA: Diagnosis not present

## 2022-10-02 DIAGNOSIS — F411 Generalized anxiety disorder: Secondary | ICD-10-CM | POA: Diagnosis not present

## 2022-10-02 DIAGNOSIS — F331 Major depressive disorder, recurrent, moderate: Secondary | ICD-10-CM | POA: Diagnosis not present

## 2022-10-02 DIAGNOSIS — F4001 Agoraphobia with panic disorder: Secondary | ICD-10-CM | POA: Diagnosis not present

## 2022-10-03 ENCOUNTER — Emergency Department (HOSPITAL_COMMUNITY)
Admission: EM | Admit: 2022-10-03 | Discharge: 2022-10-04 | Disposition: A | Discharge status: Home / Self Care | Payer: 59 | Attending: Emergency Medicine | Admitting: Emergency Medicine

## 2022-10-03 ENCOUNTER — Other Ambulatory Visit: Payer: Self-pay

## 2022-10-03 ENCOUNTER — Encounter (HOSPITAL_COMMUNITY): Payer: Self-pay

## 2022-10-03 DIAGNOSIS — R000 Tachycardia, unspecified: Secondary | ICD-10-CM | POA: Diagnosis not present

## 2022-10-03 DIAGNOSIS — G40B09 Juvenile myoclonic epilepsy, not intractable, without status epilepticus: Secondary | ICD-10-CM

## 2022-10-03 DIAGNOSIS — R569 Unspecified convulsions: Secondary | ICD-10-CM | POA: Diagnosis not present

## 2022-10-03 DIAGNOSIS — I10 Essential (primary) hypertension: Secondary | ICD-10-CM | POA: Diagnosis not present

## 2022-10-03 LAB — CBC WITH DIFFERENTIAL/PLATELET
Abs Immature Granulocytes: 0.04 10*3/uL (ref 0.00–0.07)
Basophils Absolute: 0.1 10*3/uL (ref 0.0–0.1)
Basophils Relative: 1 %
Eosinophils Absolute: 0.1 10*3/uL (ref 0.0–0.5)
Eosinophils Relative: 1 %
HCT: 39.9 % (ref 36.0–46.0)
Hemoglobin: 12.9 g/dL (ref 12.0–15.0)
Immature Granulocytes: 0 %
Lymphocytes Relative: 15 %
Lymphs Abs: 2 10*3/uL (ref 0.7–4.0)
MCH: 24.6 pg — ABNORMAL LOW (ref 26.0–34.0)
MCHC: 32.3 g/dL (ref 30.0–36.0)
MCV: 76 fL — ABNORMAL LOW (ref 80.0–100.0)
Monocytes Absolute: 0.7 10*3/uL (ref 0.1–1.0)
Monocytes Relative: 5 %
Neutro Abs: 10.2 10*3/uL — ABNORMAL HIGH (ref 1.7–7.7)
Neutrophils Relative %: 78 %
Platelets: 514 10*3/uL — ABNORMAL HIGH (ref 150–400)
RBC: 5.25 MIL/uL — ABNORMAL HIGH (ref 3.87–5.11)
RDW: 15.6 % — ABNORMAL HIGH (ref 11.5–15.5)
WBC: 13 10*3/uL — ABNORMAL HIGH (ref 4.0–10.5)
nRBC: 0 % (ref 0.0–0.2)

## 2022-10-03 LAB — URINALYSIS, ROUTINE W REFLEX MICROSCOPIC
Bilirubin Urine: NEGATIVE
Glucose, UA: NEGATIVE mg/dL
Hgb urine dipstick: NEGATIVE
Ketones, ur: 5 mg/dL — AB
Nitrite: NEGATIVE
Protein, ur: NEGATIVE mg/dL
Specific Gravity, Urine: 1.01 (ref 1.005–1.030)
pH: 7 (ref 5.0–8.0)

## 2022-10-03 LAB — COMPREHENSIVE METABOLIC PANEL
ALT: 26 U/L (ref 0–44)
AST: 32 U/L (ref 15–41)
Albumin: 3.5 g/dL (ref 3.5–5.0)
Alkaline Phosphatase: 57 U/L (ref 38–126)
Anion gap: 11 (ref 5–15)
BUN: 10 mg/dL (ref 6–20)
CO2: 20 mmol/L — ABNORMAL LOW (ref 22–32)
Calcium: 8.8 mg/dL — ABNORMAL LOW (ref 8.9–10.3)
Chloride: 106 mmol/L (ref 98–111)
Creatinine, Ser: 0.77 mg/dL (ref 0.44–1.00)
GFR, Estimated: >60 mL/min (ref >60)
Glucose, Bld: 115 mg/dL — ABNORMAL HIGH (ref 70–99)
Potassium: 4.2 mmol/L (ref 3.5–5.1)
Sodium: 137 mmol/L (ref 135–145)
Total Bilirubin: 0.3 mg/dL (ref 0.3–1.2)
Total Protein: 7.5 g/dL (ref 6.5–8.1)

## 2022-10-03 LAB — MAGNESIUM: Magnesium: 2.3 mg/dL (ref 1.7–2.4)

## 2022-10-03 LAB — I-STAT BETA HCG BLOOD, ED (MC, WL, AP ONLY): I-stat hCG, quantitative: <5 m[IU]/mL (ref <5)

## 2022-10-03 LAB — CBG MONITORING, ED: Glucose-Capillary: 116 mg/dL — ABNORMAL HIGH (ref 70–99)

## 2022-10-03 MED ORDER — SODIUM CHLORIDE 0.9 % IV SOLN: 2000.0000 mg | Freq: Once | INTRAVENOUS | Status: DC

## 2022-10-03 MED ORDER — LEVETIRACETAM IN NACL 1000 MG/100ML IV SOLN: 1000.0000 mg | Freq: Once | INTRAVENOUS | Status: DC

## 2022-10-03 MED ORDER — LORAZEPAM 2 MG/ML IJ SOLN
0.5000 mg | Freq: Once | INTRAMUSCULAR | Status: AC
  Administered 2022-10-03: 0.5 mg via INTRAVENOUS
  Filled 2022-10-03: qty 1

## 2022-10-03 MED ORDER — LEVETIRACETAM ER 1500 MG PO TB24: 1500.0000 mg | ORAL_TABLET | Freq: Two times a day (BID) | ORAL | 0 refills | Status: DC

## 2022-10-03 MED ORDER — SODIUM CHLORIDE 0.9 % IV SOLN: INTRAVENOUS | Status: DC

## 2022-10-03 MED ORDER — SODIUM CHLORIDE 0.9 % IV BOLUS
1000.0000 mL | Freq: Once | INTRAVENOUS | Status: AC
  Administered 2022-10-03: 1000 mL via INTRAVENOUS

## 2022-10-03 NOTE — ED Provider Notes (Signed)
[facility] EMERGENCY DEPARTMENT Provider Note   CSN: [id number] Arrival date & time: 10/03/22  2034     History  Chief Complaint  Patient presents with   Seizures    Alison Brock is a 21 y.o. female.  Pt is a 21 yo female with a pmhx significant for anxiety, depression, and juvenile myoclonic epilepsy.  She is followed by Elveria Rising, NP at peds neuro and takes keppra.  She said she's been compliant with her meds.  She has been feeling very anxious.  She felt weak today and had some pain in her stomach.  Today, she said she had a seizure for about an hour.  Mom gave her some ativan, but it did not help.  She does remember the seizure.  It stopped by the time EMS arrived.  No post-ictal phase.  A new psychiatrist did recently start her on prozac.       Home Medications Prior to Admission medications   Medication Sig Start Date End Date Taking? Authorizing Provider  amitriptyline (ELAVIL) 25 MG tablet TAKE TWO TABLETS BY MOUTH EVERY NIGHT AT BEDTIME 07/21/22   Elveria Rising, NP  busPIRone (BUSPAR) 15 MG tablet Take 15 mg by mouth 2 (two) times daily.  12/15/18   [provider]  FLUoxetine (PROZAC) 10 MG capsule Take 10 mg by mouth daily. 06/04/22   [provider]  FLUoxetine (PROZAC) 20 MG capsule Take 20 mg by mouth daily.    [provider]  levETIRAcetam 1500 MG TB24 Take 1,500 mg by mouth 2 (two) times daily. 10/03/22 11/02/22  Jacalyn Lefevre, MD  LORazepam (ATIVAN) 0.5 MG tablet Take 1/2 tablet 3 times per day for anxiety Patient taking differently: Take 3/4 tablet 3 times per day for anxiety 05/29/22   Elveria Rising, NP  LOW-OGESTREL 0.3-30 MG-MCG tablet Take 1 tablet by mouth daily. 07/22/22   Elveria Rising, NP  Magnesium Oxide 500 MG TABS Take 500 mg by mouth daily.     [provider]  pyridOXINE (VITAMIN B-6) 100 MG tablet Take 100 mg by mouth daily.    [provider]  riboflavin (VITAMIN  B-2) 100 MG TABS tablet Take 100 mg by mouth daily.    [provider]      Allergies    Sulfamethoxazole-trimethoprim, Bentyl [dicyclomine hcl], Doxycycline, Grapefruit extract, Other, and Topiramate er    Review of Systems   Review of Systems  Gastrointestinal:  Positive for abdominal pain.  Neurological:  Positive for seizures.  Psychiatric/Behavioral:  The patient is nervous/anxious.   All other systems reviewed and are negative.   Physical Exam Updated Vital Signs BP 126/79   Pulse (!) 107   Temp 98 F (36.7 C) (Oral)   Resp 19   Ht 5\' 3"  (1.6 m)   Wt 98.4 kg   SpO2 98%   BMI 38.44 kg/m  Physical Exam Vitals and nursing note reviewed.  Constitutional:      Appearance: Normal appearance.  HENT:     Head: Normocephalic and atraumatic.     Right Ear: External ear normal.     Left Ear: External ear normal.     Nose: Nose normal.     Mouth/Throat:     Mouth: Mucous membranes are dry.  Eyes:     Extraocular Movements: Extraocular movements intact.     Conjunctiva/sclera: Conjunctivae normal.     Pupils: Pupils are equal, round, and reactive to light.  Cardiovascular:     Rate and Rhythm:  Normal rate and regular rhythm.     Pulses: Normal pulses.     Heart sounds: Normal heart sounds.  Pulmonary:     Effort: Pulmonary effort is normal.  Abdominal:     General: Abdomen is flat. Bowel sounds are normal.     Palpations: Abdomen is soft.  Musculoskeletal:        General: Normal range of motion.     Cervical back: Normal range of motion and neck supple.  Skin:    General: Skin is warm.     Capillary Refill: Capillary refill takes less than 2 seconds.  Neurological:     General: No focal deficit present.     Mental Status: She is alert and oriented to person, place, and time.  Psychiatric:        Mood and Affect: Mood is anxious. Affect is tearful.        Behavior: Behavior normal.     ED Results / Procedures / Treatments   Labs (all labs ordered  are listed, but only abnormal results are displayed) Labs Reviewed  COMPREHENSIVE METABOLIC PANEL - Abnormal; Notable for the following components:      Result Value   CO2 20 (*)    Glucose, Bld 115 (*)    Calcium 8.8 (*)    All other components within normal limits  CBC WITH DIFFERENTIAL/PLATELET - Abnormal; Notable for the following components:   WBC 13.0 (*)    RBC 5.25 (*)    MCV 76.0 (*)    MCH 24.6 (*)    RDW 15.6 (*)    Platelets 514 (*)    Neutro Abs 10.2 (*)    All other components within normal limits  URINALYSIS, ROUTINE W REFLEX MICROSCOPIC - Abnormal; Notable for the following components:   APPearance HAZY (*)    Ketones, ur 5 (*)    Leukocytes,Ua MODERATE (*)    Bacteria, UA RARE (*)    All other components within normal limits  CBG MONITORING, ED - Abnormal; Notable for the following components:   Glucose-Capillary 116 (*)    All other components within normal limits  MAGNESIUM  LEVETIRACETAM LEVEL  I-STAT BETA HCG BLOOD, ED ([facility], [facility], AP ONLY)    EKG None  Radiology No results found.  Procedures Procedures    Medications Ordered in ED Medications  sodium chloride 0.9 % bolus 1,000 mL (0 mLs Intravenous Stopped 10/03/22 2300)    And  0.9 %  sodium chloride infusion ( Intravenous New Bag/Given 10/03/22 2301)  LORazepam (ATIVAN) injection 0.5 mg (0.5 mg Intravenous Given 10/03/22 2124)    ED Course/ Medical Decision Making/ A&P                           Medical Decision Making Amount and/or Complexity of Data Reviewed Labs: ordered.  Risk Prescription drug management.   This patient presents to the ED for concern of seizure, this involves an extensive number of treatment options, and is a complaint that carries with it a high risk of complications and morbidity.  The differential diagnosis includes pseudoseizure, myoclonic epilepsy, infection   Co morbidities that complicate the patient evaluation  anxiety, depression, and juvenile myoclonic  epilepsy   Additional history obtained:  Additional history obtained from epic chart review External records from outside source obtained and reviewed including EMS report   Lab Tests:  I Ordered, and personally interpreted labs.  The pertinent results include:  ua + ketones, cmp  nl, cbc 13, mg 2.3, hgb <5    Cardiac Monitoring:  The patient was maintained on a cardiac monitor.  I personally viewed and interpreted the cardiac monitored which showed an underlying rhythm of: nsr   Medicines ordered and prescription drug management:  I ordered medication including keppra  for seizure  Reevaluation of the patient after these medicines showed that the patient improved I have reviewed the patients home medicines and have made adjustments as needed   Consultations Obtained:  I requested consultation with the neurologist (Dr. Devonne Doughty),  and discussed lab and imaging findings as well as pertinent plan - he recommends giving pt 2 g keppra tonight and increasing keppra to 1500 mb bid.   Problem List / ED Course:  Seizure:  Pt just took her home keppra, so I did not give her additional oral keppra.  I gave pt a rx for her increased dose.  Pt is stable for d/c.  She is to return if worse.  F/u with neuro.   Reevaluation:  After the interventions noted above, I reevaluated the patient and found that they have :improved   Social Determinants of Health:  Lives at home   Dispostion:  After consideration of the diagnostic results and the patients response to treatment, I feel that the patent would benefit from discharge with outpatient f/u.          Final Clinical Impression(s) / ED Diagnoses Final diagnoses:  Seizure [facility])    Rx / DC Orders ED Discharge Orders          Ordered    levETIRAcetam 1500 MG TB24  2 times daily        10/03/22 2316              Jacalyn Lefevre, MD 10/03/22 2321

## 2022-10-03 NOTE — ED Triage Notes (Signed)
Patient bib [facility] with complaints of seizure like activity. Patient reports that her stomach was tight and burning for 1 hour. Patient now states she is weak. She has a history of absent seizures, and severe anxiety. Patient states she takes keppra and never misses a dose.

## 2022-10-06 ENCOUNTER — Telehealth (INDEPENDENT_AMBULATORY_CARE_PROVIDER_SITE_OTHER): Payer: Self-pay | Admitting: Family

## 2022-10-06 LAB — LEVETIRACETAM LEVEL: Levetiracetam Lvl: 7.5 ug/mL — ABNORMAL LOW (ref 10.0–40.0)

## 2022-10-06 NOTE — Telephone Encounter (Signed)
Who's calling (name and relationship to patient) :Alison Brock; mom   Best contact number: [phone] Provider they see: Elveria Rising, NP  Reason for call: Alison Brock was calling in wanting Alison Brock to give her a call back when she is available.   Call ID:      PRESCRIPTION REFILL ONLY  Name of prescription:  Pharmacy:

## 2022-10-06 NOTE — Telephone Encounter (Signed)
I called and spoke with Mom. I talked with her about the event on the weekend and the Levetiracetam level. I recommended taking the medications as prescribed for now. TG

## 2022-10-06 NOTE — Telephone Encounter (Signed)
Contacted pt's mother. Verified pt's name an DOB as well as mothers name.  Patient was present for this call.  Mom stated that they received her blood work results and noticed that her 'Keppra" levels were low.   Mom stated she wanted to go over the blood work results with Inetta Fermo. Mom also stated that they have an appointment tomorrow and that they could go over the results then.   Patient asked if she has another seizure, should she taken Ativan to stop the seizure? She doesn't want to have another hour long seizure.   I informed patient that I would relay the message to Ms. Inetta Fermo.   SS, CCMA

## 2022-10-06 NOTE — Progress Notes (Unsigned)
This is a Pediatric Specialist E-Visit consult/follow up provided via My Chart Video Visit Alison Brock and her mother Alison Brock consented to an E-Visit consult today.  Location of patient: Alison Brock is at home Location of provider: Damita Dunningsina Iveliz Garay,NP-C is at office Patient was referred by Nche, Bonna Gainsharlotte Lum, NP   The following participants were involved in this E-Visit: CMA, NP, patient and her mother  This visit was done via VIDEO   Chief Complain/ Reason for E-Visit today: seizure Total time on call: 20 min Follow up: 3 months    Alison Brock   MRN:  [id number]  2001/01/29   Provider: [name] Dior Dominik NP-C Location [facility] Care: [facility]  Visit type: Urgent return visit  Last visit: 09/09/2022  Referral source: Anne NgNche, Charlotte Lum, NP History from: Epic chart, patient and her mother  Brief history:  Copied from previous record: History of generalized seizure disorder based on clinical seizure activity and EEG, as well as headaches, anxiety and panic. She is taking and tolerating Levetiracetam XR for her seizure disorder and has remained seizure free since April 22, 2022. She is taking and tolerating Amitriptyline for migraine prevention. Alison Brock was seeing Dr Joni ReiningNicole Habit with [facility] for anxiety and panic. She is now seeing Myer HaffLauren Blankenship, DNP with [facility].  Today's concerns: Seizure 09/17/2022 - likely triggered by anxiety and lack of sleep Seizure 10/03/2022 - likely psychogenic vs epileptic. Had been sick with respiratory infection in days prior to event, felt better that day and did some household chores. Felt bad in the afternoon with arm and leg weakness, stomach pain, chest was burning, head felt full. She rested and did not feel better. As time went on, she began having shaking in her extremities, stomach felt tight, was unable to eat. The episode was lasting about an hour, and Mom gave 1/4 tablet of Ativan but symptoms continued.  EMS was called and transported to ED. Levetiracetam level was 7.145mcg/ml and dose was was increased to 1500mg  BID.  The Levetiracetam 1500mg  tablets prescribed by ED cost $1000/month. Alison Brock wants to change back to 750mg  tablets. She has continued to feel weak. Admits to increase in anxiety and panic related to upcoming Christmas holiday, disagreement with her father, grandmother's illness (stroke) and hurtful comments made by her aunt about her anxiety.  Is being seen by psychiatrist and Prozac dose was increased recently. Does not feel that it is helpful and Mom feels that anxiety has worsened since increase.  Also working with a new therapist. Has appointment with that provider today.  Alison Brock has been otherwise generally healthy since she was last seen. No health concerns today other than previously mentioned.  Review of systems: Please see HPI for neurologic and other pertinent review of systems. Otherwise all other systems were reviewed and were negative.  Problem List: Patient Active Problem List   Diagnosis Date Noted   Left foot pain 03/07/2022   Encounter for long-term current use of medication 11/18/2020   Pyelonephritis 06/02/2019   Migraine variants, not intractable 02/09/2019   Panic disorder 12/25/2018   Social anxiety disorder 10/06/2018   Frequent headaches 07/08/2016   Anxiety 06/26/2016   Nonintractable juvenile myoclonic epilepsy without status epilepticus (HCC) 06/26/2016   Seizure disorder (HCC) 06/26/2016   AP (abdominal pain) 06/02/2016   Loss of weight 06/02/2016   Constipation 06/02/2016     Past Medical History:  Diagnosis Date   Anxiety    Seizures (HCC)     Past medical history comments: See  HPI  Surgical history: Past Surgical History:  Procedure Laterality Date   GUM SURGERY  03/2016    Family history: family history includes Anxiety disorder in her maternal aunt; Bipolar disorder in her maternal aunt; Depression in her paternal grandfather;  Hypertension in her maternal grandmother, mother, and paternal grandfather.   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 She is not currently working.    Lives with her mother.   Social Determinants of Health   Financial Resource Strain: Unknown (06/02/2019)   Overall Financial Resource Strain (CARDIA)    Difficulty of Paying Living Expenses: Patient refused  Food Insecurity: Unknown (06/02/2019)   Hunger Vital Sign    Worried About Running Out of Food in the Last Year: Patient refused    Ran Out of Food in the Last Year: Patient refused  Transportation Needs: Unknown (06/02/2019)   PRAPARE - Administrator, Civil Service (Medical): Patient refused    Lack of Transportation (Non-Medical): Patient refused  Physical Activity: Unknown (06/02/2019)   Exercise Vital Sign    Days of Exercise per Week: Patient refused    Minutes of Exercise per Session: Patient refused  Stress: Unknown (06/02/2019)   [employer] of Occupational Health - Occupational Stress Questionnaire    Feeling of Stress : Patient refused  Social Connections: Unknown (06/02/2019)   Social Connection and Isolation Panel [NHANES]    Frequency of Communication with Friends and Family: Patient refused    Frequency of Social Gatherings with Friends and Family: Patient refused     Attends Religious Services: Patient refused    Active Member of Clubs or Organizations: Patient refused    Attends Banker Meetings: Patient refused    Marital Status: Patient refused  Intimate Partner Violence: Unknown (06/02/2019)   Humiliation, Afraid, Rape, and Kick questionnaire    Fear of Current or Ex-Partner: Patient refused    Emotionally Abused: Patient refused    Physically Abused: Patient refused    Sexually Abused: Patient refused    Past/failed meds: Copied from previous record: Topiramate ER - worsened anxiety and panic    Allergies: Allergies  Allergen Reactions   Sulfamethoxazole-Trimethoprim Nausea And Vomiting   Bentyl [Dicyclomine Hcl]    Doxycycline Other (See Comments)   Grapefruit Extract Other (See Comments)    Pt is not supposed to eat grapefruit with medication    Other     Seasonal Allergies - Spring and Fall   Topiramate Er     Immunizations: Immunization History  Administered Date(s) Administered   PFIZER(Purple Top)SARS-COV-2 Vaccination 07/07/2020, 08/04/2020   Tdap 01/10/2013    Diagnostics/Screenings: Copied from previous record: 04/24/2021 - rEEG - This EEG is unremarkable during  awake state. There were just a couple of single sharps in the bilateral frontal area which is not significant.  This is significant improvement compared to her initial EEGs with frequent generalized discharges. Please note that normal EEG does not exclude epilepsy, clinical correlation is indicated.  Keturah Shavers, MD   04/29/18 - rEEG - This EEG is abnormal due to episodes of generalized discharges, more frontally predominant throughout the recording. The findings consistent with generalized seizure disorder, possibly juvenile myoclonic epilepsy, associated with lower seizure threshold and require careful clinical correlation    07/27/2016 - MRI Brain w/wo contrast - Artifact related to braces. Allowing for that, the examination is normal. No evidence of  malformation. No evidence of acquired brain disease.  Physical Exam: Wt 217 lb (98.4 kg)   BMI 38.44 kg/m  Examination was limited by poor video quality  General: Well developed, well nourished young woman, seated at her home, in no evident distress Head: Head normocephalic and atraumatic.  Neck: Supple Musculoskeletal: No obvious deformities or scoliosis Skin: No rashes or neurocutaneous lesions  Neurologic Exam Mental Status: Awake and fully alert.  Oriented to place and time.  Recent and remote memory intact.  Attention span, concentration, and fund of knowledge appropriate.  Mood and affect appropriate.  Impression: Nonintractable juvenile myoclonic epilepsy without status epilepticus (HCC) - Plan: levETIRAcetam (KEPPRA XR) 750 MG 24 hr tablet  Anxiety  Panic disorder   Recommendations for plan of care: The patient's previous Epic records were reviewed. Recent diagnostic studies were reviewed with the patient. Encouraged patient not to focus on lab results. Explained that the event on 10/03/22 was more likely psychogenic rather than epileptic in nature Plan until next visit: I will send in an updated prescription for the Levetiracetam 750mg  tablets.  Encouraged close follow up with psychiatry and therapist Encouraged to take Ativan sooner if another event occurs to see if it helps to stop symptoms.  Call if seizures occur, or for other questions. Return in about 3 months (around 01/06/2023).  The medication list was reviewed and reconciled. I reviewed the changes that were made in the prescribed medications today. A complete medication list was provided to the patient.  Allergies as of 10/07/2022       Reactions   Sulfamethoxazole-trimethoprim Nausea And Vomiting   Bentyl [dicyclomine Hcl]    Doxycycline Other (See Comments)   Grapefruit Extract Other (See Comments)   Pt is not supposed to eat grapefruit with medication    Other    Seasonal Allergies - Spring and Fall    Topiramate Er         Medication List        Accurate as of October 07, 2022 10:07 AM. If you have any questions, ask your nurse or doctor.          amitriptyline 25 MG tablet Commonly known as: ELAVIL TAKE TWO TABLETS BY MOUTH EVERY NIGHT AT BEDTIME   busPIRone 15 MG tablet Commonly known as: BUSPAR Take 15 mg by mouth 2 (two) times daily.   FLUoxetine 20 MG capsule Commonly known as: PROZAC Take 20 mg by mouth daily.   FLUoxetine 10 MG capsule Commonly known as: PROZAC Take 10 mg by mouth daily.   levETIRAcetam 750 MG 24 hr tablet Commonly known as: KEPPRA XR TAKE TWO TABLETS BY MOUTH EVERY MORNING AND TAKE TWO TABLETS BY MOUTH EVERY NIGHT AT BEDTIME What changed:  medication strength how much to take how to take this when to take this additional  instructions Changed by: Elveria Rising, NP   LORazepam 0.5 MG tablet Commonly known as: ATIVAN Take 1/2 tablet 3 times per day for anxiety What changed: additional instructions   Low-Ogestrel 0.3-30 MG-MCG tablet Generic drug: norgestrel-ethinyl estradiol Take 1 tablet by mouth daily.   Magnesium Oxide -Mg Supplement 500 MG Tabs Take 500 mg by mouth daily.   pyridOXINE 100 MG tablet Commonly known as: VITAMIN B6 Take 100 mg by mouth daily.   riboflavin 100 MG Tabs tablet Commonly known as: VITAMIN B-2 Take 100 mg by mouth daily.      Total time spent with the patient was 20 minutes, of which 50% or more was spent in counseling and coordination of care.  Elveria Rising NP-C [facility] [address], [address] [place], [place] [address] Ph. [phone] Fax [phone]

## 2022-10-07 ENCOUNTER — Encounter (INDEPENDENT_AMBULATORY_CARE_PROVIDER_SITE_OTHER): Payer: Self-pay | Admitting: Family

## 2022-10-07 ENCOUNTER — Telehealth (INDEPENDENT_AMBULATORY_CARE_PROVIDER_SITE_OTHER): Payer: 59 | Admitting: Family

## 2022-10-07 VITALS — Wt 217.0 lb

## 2022-10-07 DIAGNOSIS — F419 Anxiety disorder, unspecified: Secondary | ICD-10-CM

## 2022-10-07 DIAGNOSIS — F410 Panic disorder [episodic paroxysmal anxiety] without agoraphobia: Secondary | ICD-10-CM

## 2022-10-07 DIAGNOSIS — R69 Illness, unspecified: Secondary | ICD-10-CM | POA: Diagnosis not present

## 2022-10-07 DIAGNOSIS — G40B09 Juvenile myoclonic epilepsy, not intractable, without status epilepticus: Secondary | ICD-10-CM | POA: Diagnosis not present

## 2022-10-07 MED ORDER — LEVETIRACETAM ER 750 MG PO TB24: ORAL_TABLET | ORAL | 5 refills | Status: DC

## 2022-10-07 NOTE — Patient Instructions (Addendum)
It was a pleasure to see you today!  Instructions for you until your next appointment are as follows: I will change the Levetiracetam prescription back to 750mg  tablets. Take 2 tablets in the morning and 2 tablets at night Be sure to talk with your therapist and psychiatrist today about your anxiety and about the episode you had on Friday night If you have another event, take the Ativan sooner to see if it helps you to be calmer and avoid seizure-like activity Please sign up for MyChart if you have not done so. Please plan to return for follow up in 3 months or sooner if needed.  Feel free to contact our office during normal business hours at [phone] with questions or concerns. If there is no answer or the call is outside business hours, please leave a message and our clinic staff will call you back within the next business day.  If you have an urgent concern, please stay on the line for our after-hours answering service and ask for the on-call neurologist.     I also encourage you to use MyChart to communicate with me more directly. If you have not yet signed up for MyChart within [facility], the front desk staff can help you. However, please note that this inbox is NOT monitored on nights or weekends, and response can take up to 2 business days.  Urgent matters should be discussed with the on-call pediatric neurologist.   At [facility], we are committed to providing exceptional care. You will receive a patient satisfaction survey through text or email regarding your visit today. Your opinion is important to me. Comments are appreciated.

## 2022-10-15 ENCOUNTER — Other Ambulatory Visit (INDEPENDENT_AMBULATORY_CARE_PROVIDER_SITE_OTHER): Payer: Self-pay | Admitting: Family

## 2022-10-15 NOTE — Telephone Encounter (Signed)
I agreed to fill the medication while she works to get in with a gynecologist. Alison Brock

## 2022-10-16 DIAGNOSIS — F4001 Agoraphobia with panic disorder: Secondary | ICD-10-CM | POA: Diagnosis not present

## 2022-10-16 DIAGNOSIS — F410 Panic disorder [episodic paroxysmal anxiety] without agoraphobia: Secondary | ICD-10-CM | POA: Diagnosis not present

## 2022-10-16 DIAGNOSIS — F411 Generalized anxiety disorder: Secondary | ICD-10-CM | POA: Diagnosis not present

## 2022-10-16 DIAGNOSIS — R69 Illness, unspecified: Secondary | ICD-10-CM | POA: Diagnosis not present

## 2022-10-17 ENCOUNTER — Encounter (INDEPENDENT_AMBULATORY_CARE_PROVIDER_SITE_OTHER): Payer: Self-pay

## 2022-10-24 DIAGNOSIS — F4001 Agoraphobia with panic disorder: Secondary | ICD-10-CM | POA: Diagnosis not present

## 2022-10-24 DIAGNOSIS — F411 Generalized anxiety disorder: Secondary | ICD-10-CM | POA: Diagnosis not present

## 2022-10-24 DIAGNOSIS — R69 Illness, unspecified: Secondary | ICD-10-CM | POA: Diagnosis not present

## 2022-10-24 DIAGNOSIS — F410 Panic disorder [episodic paroxysmal anxiety] without agoraphobia: Secondary | ICD-10-CM | POA: Diagnosis not present

## 2022-11-05 ENCOUNTER — Telehealth (INDEPENDENT_AMBULATORY_CARE_PROVIDER_SITE_OTHER): Payer: Self-pay | Admitting: Family

## 2022-11-05 DIAGNOSIS — G40909 Epilepsy, unspecified, not intractable, without status epilepticus: Secondary | ICD-10-CM

## 2022-11-05 DIAGNOSIS — G40B09 Juvenile myoclonic epilepsy, not intractable, without status epilepticus: Secondary | ICD-10-CM

## 2022-11-05 NOTE — Telephone Encounter (Signed)
Mom called me about Alison Brock having daily episodes of seizure, thought to be non-epileptic events. She asked about performing ambulatory EEG to verify whether or not the events are epileptic or non-epileptic in nature. I agreed but recommended that she and Alison Brock continue to work closely with psychiatry about her anxiety and panic. Mom also asked about the possibility of inpatient psychiatric care and I told her that such admissions are arranged through psychiatry. Mom had no further questions. TG

## 2022-11-10 ENCOUNTER — Emergency Department (HOSPITAL_COMMUNITY)
Admission: EM | Admit: 2022-11-10 | Discharge: 2022-11-10 | Disposition: A | Discharge status: Home / Self Care | Payer: 59 | Attending: Emergency Medicine | Admitting: Emergency Medicine

## 2022-11-10 DIAGNOSIS — I10 Essential (primary) hypertension: Secondary | ICD-10-CM | POA: Diagnosis not present

## 2022-11-10 DIAGNOSIS — G40909 Epilepsy, unspecified, not intractable, without status epilepticus: Secondary | ICD-10-CM | POA: Insufficient documentation

## 2022-11-10 DIAGNOSIS — R410 Disorientation, unspecified: Secondary | ICD-10-CM | POA: Diagnosis not present

## 2022-11-10 DIAGNOSIS — R42 Dizziness and giddiness: Secondary | ICD-10-CM | POA: Diagnosis not present

## 2022-11-10 DIAGNOSIS — R9431 Abnormal electrocardiogram [ECG] [EKG]: Secondary | ICD-10-CM | POA: Diagnosis not present

## 2022-11-10 DIAGNOSIS — R569 Unspecified convulsions: Secondary | ICD-10-CM | POA: Diagnosis not present

## 2022-11-10 DIAGNOSIS — R000 Tachycardia, unspecified: Secondary | ICD-10-CM | POA: Diagnosis not present

## 2022-11-10 NOTE — Discharge Instructions (Signed)
Continue current medications. TAKE THE ATIVAN IF YOU NEED IT.

## 2022-11-10 NOTE — ED Provider Notes (Signed)
[facility] Provider Note   CSN: [id number] Arrival date & time: 11/10/22  2238     History  Chief Complaint  Patient presents with   Seizures    Alison Brock is a 22 y.o. female.  Patient presents to the emergency department for evaluation of seizures.  Patient has a seizure disorder that has been present since she was 22 years old.  She has recently developed a new seizure activity that has been speculated as psychogenic seizure-like activity.  Patient had multiple episodes tonight.  She reports that the episodes were somewhat mixed, has difficulty telling me to apart because they are similar.  She does, however, reports that she has been under a great deal of stress lately, feels very anxious and thinks these might have been more anxiety related.       Home Medications Prior to Admission medications   Medication Sig Start Date End Date Taking? Authorizing Provider  amitriptyline (ELAVIL) 25 MG tablet TAKE TWO TABLETS BY MOUTH EVERY NIGHT AT BEDTIME 07/21/22   Rockwell Germany, NP  busPIRone (BUSPAR) 15 MG tablet Take 15 mg by mouth 2 (two) times daily.  12/15/18   [provider]  FLUoxetine (PROZAC) 10 MG capsule Take 10 mg by mouth daily. 06/04/22   [provider]  FLUoxetine (PROZAC) 20 MG capsule Take 20 mg by mouth daily.    [provider]  levETIRAcetam (KEPPRA XR) 750 MG 24 hr tablet TAKE TWO TABLETS BY MOUTH EVERY MORNING AND TAKE TWO TABLETS BY MOUTH EVERY NIGHT AT BEDTIME 10/07/22   Rockwell Germany, NP  LORazepam (ATIVAN) 0.5 MG tablet Take 1/2 tablet 3 times per day for anxiety Patient taking differently: Take 3/4 tablet 3 times per day for anxiety 05/29/22   Rockwell Germany, NP  LOW-OGESTREL 0.3-30 MG-MCG tablet TAKE 1 TABLET BY MOUTH DAILY 10/15/22   Rockwell Germany, NP  Magnesium Oxide 500 MG TABS Take 500 mg by mouth daily.     [provider]  pyridOXINE (VITAMIN B-6) 100 MG  tablet Take 100 mg by mouth daily.    [provider]  riboflavin (VITAMIN B-2) 100 MG TABS tablet Take 100 mg by mouth daily.    [provider]      Allergies    Sulfamethoxazole-trimethoprim, Bentyl [dicyclomine hcl], Doxycycline, Grapefruit extract, Other, and Topiramate er    Review of Systems   Review of Systems  Physical Exam Updated Vital Signs BP (!) 130/96   Pulse 89   Temp 98.9 F (37.2 C) (Oral)   Resp 19   SpO2 100%  Physical Exam Vitals and nursing note reviewed.  Constitutional:      General: She is not in acute distress.    Appearance: She is well-developed.  HENT:     Head: Normocephalic and atraumatic.     Mouth/Throat:     Mouth: Mucous membranes are moist.  Eyes:     General: Vision grossly intact. Gaze aligned appropriately.     Extraocular Movements: Extraocular movements intact.     Conjunctiva/sclera: Conjunctivae normal.  Cardiovascular:     Rate and Rhythm: Normal rate and regular rhythm.     Pulses: Normal pulses.     Heart sounds: Normal heart sounds, S1 normal and S2 normal. No murmur heard.    No friction rub. No gallop.  Pulmonary:     Effort: Pulmonary effort is normal. No respiratory distress.     Breath sounds: Normal breath sounds.  Abdominal:  General: Bowel sounds are normal.     Palpations: Abdomen is soft.     Tenderness: There is no abdominal tenderness. There is no guarding or rebound.     Hernia: No hernia is present.  Musculoskeletal:        General: No swelling.     Cervical back: Full passive range of motion without pain, normal range of motion and neck supple. No spinous process tenderness or muscular tenderness. Normal range of motion.     Right lower leg: No edema.     Left lower leg: No edema.  Skin:    General: Skin is warm and dry.     Capillary Refill: Capillary refill takes less than 2 seconds.     Findings: No ecchymosis, erythema, rash or wound.  Neurological:     General: No focal  deficit present.     Mental Status: She is alert and oriented to person, place, and time.     GCS: GCS eye subscore is 4. GCS verbal subscore is 5. GCS motor subscore is 6.     Cranial Nerves: Cranial nerves 2-12 are intact.     Sensory: Sensation is intact.     Motor: Motor function is intact.     Coordination: Coordination is intact.  Psychiatric:        Attention and Perception: Attention normal.        Mood and Affect: Mood normal.        Speech: Speech normal.        Behavior: Behavior normal.     ED Results / Procedures / Treatments   Labs (all labs ordered are listed, but only abnormal results are displayed) Labs Reviewed - No data to display  EKG None  Radiology No results found.  Procedures Procedures    Medications Ordered in ED Medications - No data to display  ED Course/ Medical Decision Making/ A&P                             Medical Decision Making Amount and/or Complexity of Data Reviewed External Data Reviewed: notes.   Patient presents with multiple episodes tonight that were seizure-like.  She does have a seizure disorder but currently is being worked up for possible psychogenic seizures.  She has not, however, had extended video EEG monitoring to determine if her new seizures are psychogenic.  I would recommend that this be performed in the outpatient setting.  I offered additional Keppra, telling her that it was safe to augment her nighttime dose.  She did not wish to have anymore Keppra.  I offered IV Ativan and monitoring but she reports that she is more anxious being here and feels well enough to go home.  Patient will be discharged, was given discharge instructions and return precautions.        Final Clinical Impression(s) / ED Diagnoses Final diagnoses:  Seizure disorder [facility])    Rx / DC Orders ED Discharge Orders     None         Orpah Greek, MD 11/10/22 2341

## 2022-11-10 NOTE — ED Triage Notes (Signed)
Pt to ED via EMS from home. Pt has hx of epilepsy and psychosomatic seizures. Pt reports having multiple absent seizures tonight. Pt also had 5 absent seizures yesterday. Pt prescribed ativan and took  0.5 of ativan at home tonight at 2015. Pt is AAOx4 upon arrival to ED. GCS 15. Pt states when she has seizures she will "stare off" and will have burning pain in head.   EMS Vitals: 139/78 108 HR 98% RA 101 CBG 20LAC

## 2022-11-11 DIAGNOSIS — R69 Illness, unspecified: Secondary | ICD-10-CM | POA: Diagnosis not present

## 2022-11-11 DIAGNOSIS — F4001 Agoraphobia with panic disorder: Secondary | ICD-10-CM | POA: Diagnosis not present

## 2022-11-11 DIAGNOSIS — F410 Panic disorder [episodic paroxysmal anxiety] without agoraphobia: Secondary | ICD-10-CM | POA: Diagnosis not present

## 2022-11-12 ENCOUNTER — Telehealth (HOSPITAL_COMMUNITY): Payer: Self-pay | Admitting: Psychiatry

## 2022-11-17 ENCOUNTER — Emergency Department (HOSPITAL_COMMUNITY): Admission: EM | Admit: 2022-11-17 | Discharge: 2022-11-17 | Discharge status: Against Medical Advice | Payer: 59

## 2022-11-17 DIAGNOSIS — R0902 Hypoxemia: Secondary | ICD-10-CM | POA: Diagnosis not present

## 2022-11-17 DIAGNOSIS — R569 Unspecified convulsions: Secondary | ICD-10-CM | POA: Diagnosis not present

## 2022-11-17 DIAGNOSIS — R69 Illness, unspecified: Secondary | ICD-10-CM | POA: Diagnosis not present

## 2022-11-17 DIAGNOSIS — I10 Essential (primary) hypertension: Secondary | ICD-10-CM | POA: Diagnosis not present

## 2022-11-17 DIAGNOSIS — R000 Tachycardia, unspecified: Secondary | ICD-10-CM | POA: Diagnosis not present

## 2022-11-17 NOTE — ED Notes (Signed)
NT unable to locate patient at waiting area.

## 2022-11-17 NOTE — ED Notes (Signed)
Called x3 and no response

## 2022-11-17 NOTE — ED Triage Notes (Signed)
Patient arrived with EMS from home reports grand mal seizure today with anxiety attack .

## 2022-11-18 ENCOUNTER — Other Ambulatory Visit (HOSPITAL_COMMUNITY): Payer: 59 | Attending: Psychiatry | Admitting: Psychiatry

## 2022-11-18 ENCOUNTER — Encounter (INDEPENDENT_AMBULATORY_CARE_PROVIDER_SITE_OTHER): Payer: Self-pay

## 2022-11-18 ENCOUNTER — Telehealth (INDEPENDENT_AMBULATORY_CARE_PROVIDER_SITE_OTHER): Payer: Self-pay | Admitting: Family

## 2022-11-18 DIAGNOSIS — F419 Anxiety disorder, unspecified: Secondary | ICD-10-CM | POA: Insufficient documentation

## 2022-11-18 DIAGNOSIS — F32A Depression, unspecified: Secondary | ICD-10-CM | POA: Insufficient documentation
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
## 2022-11-18 NOTE — Telephone Encounter (Signed)
I called Mom back. She said that [name]'s routine EEG is scheduled for next Thursday. She is anxious for the prolonged EEG to be scheduled. TG

## 2022-11-18 NOTE — Telephone Encounter (Signed)
Patient's mother left a message asking me to call her about [name]. I called and left a message. TG

## 2022-11-19 ENCOUNTER — Ambulatory Visit (HOSPITAL_COMMUNITY): Payer: 59 | Admitting: Psychiatry

## 2022-11-19 ENCOUNTER — Encounter (HOSPITAL_COMMUNITY): Payer: Self-pay | Admitting: Psychiatry

## 2022-11-19 DIAGNOSIS — F32A Depression, unspecified: Secondary | ICD-10-CM | POA: Diagnosis not present

## 2022-11-19 DIAGNOSIS — R69 Illness, unspecified: Secondary | ICD-10-CM | POA: Diagnosis not present

## 2022-11-19 DIAGNOSIS — F419 Anxiety disorder, unspecified: Secondary | ICD-10-CM | POA: Diagnosis not present

## 2022-11-19 NOTE — Addendum Note (Signed)
Addended by: Sherlyn Hay on: 11/19/2022 04:18 PM   Modules accepted: Orders

## 2022-11-19 NOTE — Progress Notes (Signed)
Virtual Visit via Video Note   I connected with Alison Brock, who prefers to go by "Alison Brock" on 11/19/22 at  9:00 AM EDT by a video enabled telemedicine application and verified that I am speaking with the correct person using two identifiers.   At orientation to the IOP program, Case Manager discussed the limitations of evaluation and management by telemedicine and the availability of in person appointments. The patient expressed understanding and agreed to proceed with virtual visits throughout the duration of the program.   Location:  Patient: Patient Home Provider: [facility]   History of Present Illness: MDD with anxious distress   Observations/Objective: Check In: Case Manager checked in with all participants to review discharge dates, insurance authorizations, work-related documents and needs from the treatment team regarding medications. [name] stated needs and engaged in discussion.    Initial Therapeutic Activity: Counselor facilitated a check-in with [name] to assess for safety, sobriety and medication compliance.  Counselor also inquired about Alison Brock's current emotional ratings, as well as any significant changes in thoughts, feelings or behavior since previous check in.  Alison Brock presented for session on time and was alert, oriented x5, with no evidence or self-report of active SI/HI or A/V H.  Alison Brock reported compliance with medication and denied use of alcohol or illicit substances.  [name] reported scores of 4/10 for depression, 8/10 for anxiety, and 0/10 for anger/irritability.  Alison Brock denied any recent outbursts.  [name] reported that a recent success has been trying to come out of her room more often and spend time with her mother, since she was isolating frequently.  [name] reported that a recent struggle has been getting used to her new medication, which was recently increased in dose, and has led to some side effects such as psychogenic seizures.  [name] stated "I'm really frightened of  them, and had a panic attack the other night because I was in my head worrying about it happening again".  [name] reported that her goal today is to working on straightening up her room to be productive.      Second Therapeutic Activity: Counselor introduced Einar Grad, [employer] Pharmacist, to provide psychoeducation on topic of medication compliance with members today.  Jiles Garter provided psychoeducation on classes of medications such as antidepressants, antipsychotics, what symptoms they are intended to treat, and any side effects one might encounter while on a particular prescription.  Time was allowed for clients to ask any questions they might have of [facility] regarding this specialty.  Intervention effectiveness could not be measured, as client did not participate in discussion.    Third Therapeutic Activity: Counselor provided psychoeducation on subject of boundaries with group members today using a virtual handout.  This handout defined boundaries as the limits and rules that we set for ourselves within relationships, and featured a breakdown of the 3 common categories of boundaries (i.e. porous, rigid, and healthy), along with typical traits specific to each one for easy identification.  It was noted that most people have a mixture of different boundary types depending on setting, person, and culture.  Additional information was provided on the types of boundaries (i.e. physical, intellectual, emotional, sexual, material, and time) within relationships, and what could be considered healthy versus unhealthy. Counselor tasked members with identifying what types of boundaries they presently hold within her own support systems, the collective impact these boundaries have upon their mental health, and changes that could be made in order to more effectively communicate individual mental health needs.  Intervention was effective,  as evidenced by [name] actively engaging in discussion on topic, reporting that she has rigid  boundaries due to traits such as having few close relationships, being unlikely to ask for help, and protective of personal information.  [name] reported that she would work to improve boundaries by getting comfortable with peers and counselor in group so that she can learn to be more vulnerable, open up about stressors, and get helpful feedback.      Assessment and Plan: Counselor recommends that Alison Brock remain in IOP treatment to better manage mental health symptoms, ensure stability and pursue completion of treatment plan goals. Counselor recommends adherence to crisis/safety plan, taking medications as prescribed, and following up with medical professionals if any issues arise.   Follow Up Instructions: Counselor will send Webex link for next session. [name] was advised to call back or seek an in-person evaluation if the symptoms worsen or if the condition fails to improve as anticipated.   Collaboration of Care:   Medication Management AEB Dr. Fatima Sanger or Ricky Ala, NP                                          Case Manager AEB Dellia Nims, CNA   Patient/Guardian was advised Release of Information must be obtained prior to any record release in order to collaborate their care with an outside provider. Patient/Guardian was advised if they have not already done so to contact the registration department to sign all necessary forms in order for [place] to release information regarding their care.   Consent: Patient/Guardian gives verbal consent for treatment and assignment of benefits for services provided during this visit. Patient/Guardian expressed understanding and agreed to proceed.  I provided 180 minutes of non-face-to-face time during this encounter.   Shade Flood, [place], LCAS 11/19/22

## 2022-11-20 ENCOUNTER — Other Ambulatory Visit (HOSPITAL_COMMUNITY): Payer: 59 | Attending: Licensed Clinical Social Worker | Admitting: Psychiatry

## 2022-11-20 ENCOUNTER — Other Ambulatory Visit (INDEPENDENT_AMBULATORY_CARE_PROVIDER_SITE_OTHER): Payer: Self-pay | Admitting: Family

## 2022-11-20 DIAGNOSIS — Z79899 Other long term (current) drug therapy: Secondary | ICD-10-CM | POA: Insufficient documentation

## 2022-11-20 DIAGNOSIS — F329 Major depressive disorder, single episode, unspecified: Secondary | ICD-10-CM | POA: Diagnosis not present

## 2022-11-20 DIAGNOSIS — F17290 Nicotine dependence, other tobacco product, uncomplicated: Secondary | ICD-10-CM | POA: Diagnosis not present

## 2022-11-20 DIAGNOSIS — G40909 Epilepsy, unspecified, not intractable, without status epilepticus: Secondary | ICD-10-CM | POA: Diagnosis not present

## 2022-11-20 DIAGNOSIS — F603 Borderline personality disorder: Secondary | ICD-10-CM

## 2022-11-20 DIAGNOSIS — F411 Generalized anxiety disorder: Secondary | ICD-10-CM

## 2022-11-20 DIAGNOSIS — R519 Headache, unspecified: Secondary | ICD-10-CM

## 2022-11-20 NOTE — Progress Notes (Addendum)
Virtual Visit via Video Note   I connected with Alison Brock, who prefers to go by "Alison Brock" on 11/20/22 at  9:00 AM EDT by a video enabled telemedicine application and verified that I am speaking with the correct person using two identifiers.   At orientation to the IOP program, Case Manager discussed the limitations of evaluation and management by telemedicine and the availability of in person appointments. The patient expressed understanding and agreed to proceed with virtual visits throughout the duration of the program.   Location:  Patient: Patient Home Provider: Clinical Home Office   History of Present Illness: MDD and GAD  Observations/Objective: Check In: Case Manager checked in with all participants to review discharge dates, insurance authorizations, work-related documents and needs from the treatment team regarding medications. Alison Brock stated needs and engaged in discussion.    Initial Therapeutic Activity: Counselor facilitated a check-in with Alison Brock to assess for safety, sobriety and medication compliance.  Counselor also inquired about Alison Brock's current emotional ratings, as well as any significant changes in thoughts, feelings or behavior since previous check in.  Alison Brock presented for session on time and was alert, oriented x5, with no evidence or self-report of active SI/HI or A/V H.  Alison Brock reported compliance with medication and denied use of alcohol or illicit substances.  Alison Brock reported scores of 2/10 for depression, 4/10 for anxiety, and 0/10 for anger/irritability.  Alison Brock denied any recent outbursts or panic attacks.  Alison Brock reported that a recent success was enjoying her first group session yesterday.  Alison Brock reported that a recent struggle was experiencing an 'anxiety attack' last night, which left her feeling tired this morning since she didn't sleep well.  Alison Brock reported that her goal today is to celebrate her mom's birthday by picking up some presents and taking her out to dinner.        Second Therapeutic Activity: Counselor introduced topic of stress management today.  Counselor provided definition of stress as feeling tense, overwhelmed, worn out, and/or exhausted, and noted that in small amounts, stress can be motivating until things become too overwhelming to manage.  Counselor also explained how stress can be acute (brief but intense) or chronic (long-lasting) and this can impact the severity of symptoms one can experience in the physical, emotional, and behavioral categories.  Counselor inquired about members' specific stressors, how long they have been prevalent, and the various symptoms that tend to manifest as a result.  Counselor also offered several stress management strategies to help improve members' coping ability, including journaling, gratitude practice, relaxation techniques, and time management tips.  Counselor also explained that research has shown a strong support network composed of trusted family, friends, or community members can increase resilience in times of stress, and inquired about who members can reach out to for help in managing stressors.  Counselor encouraged members to consider discussing stressor 'red flags' with their close supports that can be monitored and strategies for assisting them in times of crisis.  Intervention was effective, as evidenced by Alison Brock actively participating in discussion on subject, reporting that her most significant stressors include financial worries, and getting outside of the home in public settings.  Alison Brock was able to identify several warning signs related to stress, including feeling overwhelmed or even paralyzed by anxiety, fatigue, nausea, and sleep problems.  Alison Brock reported that her stress management goal is to be more proactive about asking for help when she uses coping skills and medication, but still finds the stress to be unmanageable on her own.  Alison Brock stated "I also want to avoid feeling guilty asking for that help".   Alison Brock also expressed receptiveness to several stress management strategies practiced today in session, including positive self-care activities in her routine as an outlet/distraction from stress, such as cutting flowers to put in a vase, or getting back into swimming since she enjoyed this in the past.       Assessment and Plan: Counselor recommends that [place] remain in IOP treatment to better manage mental health symptoms, ensure stability and pursue completion of treatment plan goals. Counselor recommends adherence to crisis/safety plan, taking medications as prescribed, and following up with medical professionals if any issues arise.   Follow Up Instructions: Counselor will send Webex link for next session. Alison Brock was advised to call back or seek an in-person evaluation if the symptoms worsen or if the condition fails to improve as anticipated.   Collaboration of Care:   Medication Management AEB Dr. Fatima Sanger or Ricky Ala, NP                                          Case Manager AEB Dellia Nims, CNA   Patient/Guardian was advised Release of Information must be obtained prior to any record release in order to collaborate their care with an outside provider. Patient/Guardian was advised if they have not already done so to contact the registration department to sign all necessary forms in order for [place] to release information regarding their care.   Consent: Patient/Guardian gives verbal consent for treatment and assignment of benefits for services provided during this visit. Patient/Guardian expressed understanding and agreed to proceed.  I provided 170 minutes of non-face-to-face time during this encounter.   Shade Flood, LCSW, LCAS 11/20/22

## 2022-11-20 NOTE — Progress Notes (Signed)
IOP Initial Adult Assessment   Virtual Visit via Video Note  I connected with [name] on 11/20/22 at  9:00 AM EST by a video enabled telemedicine application and verified that I am speaking with the correct person using two identifiers.  Location: Patient: Home Provider: [facility]   I discussed the limitations of evaluation and management by telemedicine and the availability of in person appointments. The patient expressed understanding and agreed to proceed.  Patient Identification: Alison Brock MRN:  [id number] Date of Evaluation:  11/20/2022 Referral Source: Therapist Corky Mull Chief Complaint:  No chief complaint on file.  Visit Diagnosis: No diagnosis found.  History of Present Illness:   Alison Brock is a 22 yr old female who presents via Virtual Video Visit to Establish Care and for Medication Management, she enrolled in the IOP Program on 11/19/2022.  PPHx is significant for Depression, GAD w/ Panic Disorder, Borderline Personality Disorder, Psychogenic Seizures, and Epilepsy, and no history of Suicide Attempts, Self Injurious Behavior, or Psychiatric Hospitalizations.  She reports that she has been having significant issues with anxiety.  She reports that her panic disorder has been becoming more severe.  She reports that she has psychogenic seizures as well as epilepsy.  She reports that she is being scheduled for a 2-hour EEG and potentially a longer timeframe 1 depending on those results and this is making her more anxious.  She reports her anxiety is so severe she is isolating and will not leave the house without her mother or boyfriend.  She reports that her outpatient provider has recently increased her BuSpar and is tapering her off of Prozac.  She reports that in the past when her BuSpar has been increased there is a time period where she feels worse before feeling better and she feels like this is 1 of those times.  She reports a history of medical  related trauma given the hospitalizations and testing she has gone through.  She reports past psychiatric history significant for depression, GAD with panic disorder, borderline personality disorder, psychogenic seizures, and epilepsy.  She reports no history of suicide attempts.  She reports no history of self-injurious behavior.  She reports no history of psychiatric hospitalizations.  She reports past medical history seen for epilepsy.  She reports past surgical history significant for gum surgery.  She reports 1 possible concussion when she accidentally hit her head against a wall in 2020 resulting in her having light sensitivity.  She does have a history of psychogenic seizures and epilepsy.  She reports allergies to Topamax, doxycycline, Bentyl, and SMX-TMP.  She reports current lives in a house with her mother.  She reports she is in an online high school [name] working on finishing her senior year.  She reports no alcohol use.  She reports vaping daily- nicotine.  She reports no illicit substance use.  She reports no current legal issues.  She reports that her mother does have a gun but that is kept locked in a safe and she does not know the location of the safe.  Encouraged her to fully engage in the [facility] program.  Discussed with her that since her outpatient provider is currently making medication changes we would not make any changes at this time and she was agreeable with this.  She reports no SI, HI, or AVH.  She reports her sleep is fair.  She reports her appetite is fair.  She reports no other concerns at present.  Associated Signs/Symptoms: Depression Symptoms:  depressed  mood, anhedonia, fatigue, anxiety, panic attacks, loss of energy/fatigue, decreased appetite, (Hypo) Manic Symptoms:   Reports None Anxiety Symptoms:  Excessive Worry, Panic Symptoms, Psychotic Symptoms:   Reports None PTSD Symptoms: Re-experiencing:  Intrusive Thoughts Hypervigilance:  Yes Hyperarousal:   None Avoidance:  None  Past Psychiatric History: Depression, GAD w/ Panic Disorder, Borderline Personality Disorder, Psychogenic Seizures, and Epilepsy, and no history of Suicide Attempts, Self Injurious Behavior, or Psychiatric Hospitalizations.  Previous Psychotropic Medications: Yes  Prozac, Elavil, Buspar  Substance Abuse History in the last 12 months:  No.  Consequences of Substance Abuse: NA  Past Medical History:  Past Medical History:  Diagnosis Date   Anxiety    Seizures ([place])     Past Surgical History:  Procedure Laterality Date   GUM SURGERY  03/2016    Family Psychiatric History: Father- Anxiety, EtOH Abuse Maternal Aunt- multiple Suicide Attempts, Substance Abuse Paternal Grandfather- Depression  Family History:  Family History  Problem Relation Age of Onset   Hypertension Mother    Drug abuse Father    Alcohol abuse Father    Bipolar disorder Maternal Aunt    Anxiety disorder Maternal Aunt    Hypertension Maternal Grandmother    Hypertension Paternal Grandfather    Depression Paternal Grandfather     Social History:   Social History   Socioeconomic History   Marital status: Single    Spouse name: Not on file   Number of children: Not on file   Years of education: Not on file   Highest education level: Not on file  Occupational History   Not on file  Tobacco Use   Smoking status: Every Day    Types: E-cigarettes    Start date: 08/20/2021    Passive exposure: Yes   Smokeless tobacco: Never  Vaping Use   Vaping Use: Every day   Start date: 10/21/2019   Substances: Nicotine   Devices: Vuse  Substance and Sexual Activity   Alcohol use: No   Drug use: No   Sexual activity: Never  Other Topics Concern   Not on file  Social History Narrative   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 She is not currently working.    Lives with her mother.   Social Determinants of Health   Financial Resource Strain: Unknown  (06/02/2019)   Overall Financial Resource Strain (CARDIA)    Difficulty of Paying Living Expenses: Patient refused  Food Insecurity: Unknown (06/02/2019)   Hunger Vital Sign    Worried About Running Out of Food in the Last Year: Patient refused    [place] in the Last Year: Patient refused  Transportation Needs: Unknown (06/02/2019)   PRAPARE - Transportation    Lack of Transportation (Medical): Patient refused    Lack of Transportation (Non-Medical): Patient refused  Physical Activity: Unknown (06/02/2019)   Exercise Vital Sign    Days of Exercise per Week: Patient refused    Minutes of Exercise per Session: Patient refused  Stress: Unknown (06/02/2019)   [place] of Stress : Patient refused  Social Connections: Unknown (06/02/2019)   Social Connection and Isolation Panel [NHANES]    Frequency of Communication with Friends and Family: Patient refused    Frequency of Social Gatherings with Friends and Family: Patient refused    Attends Religious Services: Patient refused    Active Member of Clubs or Organizations: Patient refused    Attends  Archivist Meetings: Patient refused    Marital Status: Patient refused    Additional Social History: None  Allergies:   Allergies  Allergen Reactions   Sulfamethoxazole-Trimethoprim Nausea And Vomiting   Bentyl [Dicyclomine Hcl]    Doxycycline Other (See Comments)   Grapefruit Extract Other (See Comments)    Pt is not supposed to eat grapefruit with medication    Other     Seasonal Allergies - Spring and Fall   Topiramate Er     Metabolic Disorder Labs: No results found for: "HGBA1C", "MPG" No results found for: "PROLACTIN" No results found for: "CHOL", "TRIG", "HDL", "CHOLHDL", "VLDL", "LDLCALC" Lab Results  Component Value Date   TSH 1.06 06/02/2016    Therapeutic Level Labs: No results found for: "LITHIUM" No results found for:  "CBMZ" No results found for: "VALPROATE"  Current Medications: Current Outpatient Medications  Medication Sig Dispense Refill   amitriptyline (ELAVIL) 25 MG tablet TAKE TWO TABLETS BY MOUTH EVERY NIGHT AT BEDTIME 60 tablet 3   busPIRone (BUSPAR) 15 MG tablet Take 20 mg by mouth 2 (two) times daily.     FLUoxetine (PROZAC) 10 MG capsule Take 10 mg by mouth daily.     levETIRAcetam (KEPPRA XR) 750 MG 24 hr tablet TAKE TWO TABLETS BY MOUTH EVERY MORNING AND TAKE TWO TABLETS BY MOUTH EVERY NIGHT AT BEDTIME 120 tablet 5   LORazepam (ATIVAN) 0.5 MG tablet Take 1/2 tablet 3 times per day for anxiety (Patient taking differently: Take 3/4 tablet 3 times per day for anxiety) 10 tablet 0   LOW-OGESTREL 0.3-30 MG-MCG tablet TAKE 1 TABLET BY MOUTH DAILY 28 tablet 2   Magnesium Oxide 500 MG TABS Take 500 mg by mouth daily.      pyridOXINE (VITAMIN B-6) 100 MG tablet Take 100 mg by mouth daily.     riboflavin (VITAMIN B-2) 100 MG TABS tablet Take 100 mg by mouth daily.     No current facility-administered medications for this visit.    Musculoskeletal: Strength & Muscle Tone: within normal limits Gait & Station:  Sitting During Interview Patient leans: N/A  Psychiatric Specialty Exam: Review of Systems  Respiratory:  Negative for shortness of breath.   Cardiovascular:  Negative for chest pain.  Gastrointestinal:  Negative for abdominal pain, constipation, diarrhea, nausea and vomiting.  Neurological:  Negative for dizziness, weakness and headaches.  Psychiatric/Behavioral:  Positive for dysphoric mood. Negative for hallucinations and sleep disturbance. The patient is nervous/anxious.     There were no vitals taken for this visit.There is no height or weight on file to calculate BMI.  General Appearance: Casual and Fairly Groomed  Eye Contact:  Good  Speech:  Clear and Coherent and Normal Rate  Volume:  Normal  Mood:  Anxious  Affect:  Congruent  Thought Process:  Coherent and Goal Directed   Orientation:  Full (Time, Place, and Person)  Thought Content:  WDL and Logical  Suicidal Thoughts:  No  Homicidal Thoughts:  No  Memory:  Immediate;   Good Recent;   Good  Judgement:  Good  Insight:  Good  Psychomotor Activity:  Normal  Concentration:  Concentration: Good and Attention Span: Good  Recall:  Good  Fund of Knowledge:Good  Language: Good  Akathisia:  Negative  Handed:  Right  AIMS (if indicated):  not done  Assets:  Communication Skills Desire for Improvement Housing Resilience Social Support  ADL's:  Intact  Cognition: WNL  Sleep:  Fair   Screenings: GAD-7  [place] Office Visit from 08/01/2021 in [place] at [facility]  Total GAD-7 Score [place] Counselor from 11/18/2022 in [place] Video Visit from 12/05/2021 in [place] at [facility] Visit from 10/01/2021 in [place] at [facility] Visit from 08/01/2021 in [place] at [facility]  PHQ-2 Total Score 3 0 0 0  PHQ-9 Total Score 17 -- -- 10      Flowsheet Row Counselor from 11/18/2022 in [place] ED from 11/10/2022 in [facility] Department at [facility] ED from 10/03/2022 in [facility] Emergency Department at [place] Error: Question 6 not populated No Risk No Risk       Assessment and Plan:  Arnetta "[name]" Minney is a 22 yr old female who presents via Virtual Video Visit to [place] and for Medication Management, she enrolled in the IOP Program on 11/19/2022.  PPHx is significant for Depression, GAD w/ Panic Disorder, Borderline Personality Disorder, Psychogenic Seizures, and Epilepsy, and no history of Suicide Attempts, Self Injurious Behavior, or Psychiatric Hospitalizations.   [name] will benefit from the [place].  She is currently  tapering off her Prozac and recently had her Buspar increased by her outpatient provider.  We will not make any medication changes at this time.  We will continue to monitor.    MDD  GAD: -Continue Buspar 20 mg TID.  No refills sent at this time. -Continue Elavil 50 mg QHS.  No refills sent at this time. -Tapering off Prozac per outpatient provider -Continue Ativan 0.375 mg TID PRN.  No refills sent at this time.   Collaboration of Care: Other IOP Program  Patient/Guardian was advised Release of Information must be obtained prior to any record release in order to collaborate their care with an outside provider. Patient/Guardian was advised if they have not already done so to contact the registration department to sign all necessary forms in order for [place] to release information regarding their care.   Consent: Patient/Guardian gives verbal consent for treatment and assignment of benefits for services provided during this visit. Patient/Guardian expressed understanding and agreed to proceed.   Briant Cedar, MD 2/1/20248:45 AM  Follow Up Instructions:    I discussed the assessment and treatment plan with the patient. The patient was provided an opportunity to ask questions and all were answered. The patient agreed with the plan and demonstrated an understanding of the instructions.   The patient was advised to call back or seek an in-person evaluation if the symptoms worsen or if the condition fails to improve as anticipated.  I provided 45 minutes of non-face-to-face time during this encounter.   Briant Cedar, MD

## 2022-11-21 ENCOUNTER — Encounter (HOSPITAL_COMMUNITY): Payer: Self-pay | Admitting: Psychiatry

## 2022-11-21 ENCOUNTER — Other Ambulatory Visit (HOSPITAL_COMMUNITY): Payer: 59 | Attending: Licensed Clinical Social Worker | Admitting: Licensed Clinical Social Worker

## 2022-11-21 DIAGNOSIS — F329 Major depressive disorder, single episode, unspecified: Secondary | ICD-10-CM | POA: Insufficient documentation

## 2022-11-21 DIAGNOSIS — F411 Generalized anxiety disorder: Secondary | ICD-10-CM | POA: Insufficient documentation

## 2022-11-21 NOTE — Progress Notes (Signed)
Virtual Visit via Video Note   I connected with Alison Brock, who prefers to go by "Alison Brock" on 11/21/22 at  9:00 AM EDT by a video enabled telemedicine application and verified that I am speaking with the correct person using two identifiers.   At orientation to the IOP program, Case Manager discussed the limitations of evaluation and management by telemedicine and the availability of in person appointments. The patient expressed understanding and agreed to proceed with virtual visits throughout the duration of the program.   Location:  Patient: Patient Home Provider: [facility] [place] [facility]   History of Present Illness: MDD and GAD  Observations/Objective: Check In: Case Manager checked in with all participants to review discharge dates, insurance authorizations, work-related documents and needs from the treatment team regarding medications. Alison Brock stated needs and engaged in discussion.    Initial Therapeutic Activity: Counselor facilitated a check-in with Alison Brock to assess for safety, sobriety and medication compliance.  Counselor also inquired about Alison Brock's current emotional ratings, as well as any significant changes in thoughts, feelings or behavior since previous check in.  Alison Brock presented for session on time and was alert, oriented x5, with no evidence or self-report of active SI/HI or A/V H.  Alison Brock reported compliance with medication and denied use of alcohol or illicit substances.  Alison Brock reported scores of 6/10 for depression, 3/10 for anxiety, and 5/10 for irritability.  Alison Brock denied any recent outbursts or panic attacks.  Alison Brock reported that a recent success was celebrating her mother's birthday, stating "That was fun".  Alison Brock reported that one struggle was having a disagreement with her boyfriend this morning, stating "He was supposed to get up early with me, but when I woke him up he got upset".  Alison Brock reported that her goal today is to find something to do for self-care.         Second  Therapeutic Activity: Counselor introduced topic of assertive communication today.  Counselor shared various handouts with members virtually in group to read along with on the subject.  These handouts defined assertive communication as a communication style in which a person stands up for their own needs and wants, while also taking into consideration the needs and wants of others, without behaving in a passive or aggressive way.  Traits of assertive communicators were highlighted such as using appropriate speaking volume, maintaining eye contact, using confident language, and avoiding interruption.  Members were also provided with tips on how to improve communication, including respecting oneself, expressing thoughts and feelings calmly, and saying "No" when necessary.  Members were given a variety of scenarios where they could practice using these tips to respond in an assertive manner.  Intervention was effective, as evidenced by [employer] participating in discussion on topic, reporting that she is usually assertive about addressing issues in relationships, but recently noticed that she can be aggressive due to speaking in a loud or overbearing way, and becoming easily frustrated.  Alison Brock reported that this has led supports to get upset when they misunderstand her approach, stating "My mom asked why I was yelling at her the other day, but I wasn't.  It was just my tone and volume.  I need to be more conscious of it".  Alison Brock showed more effective use of assertive communication skills through engagement in roleplay activities.    Assessment and Plan: Counselor recommends that Alison Brock remain in IOP treatment to better manage mental health symptoms, ensure stability and pursue completion of treatment plan goals. Counselor recommends adherence to crisis/safety plan,  taking medications as prescribed, and following up with medical professionals if any issues arise.   Follow Up Instructions: Counselor will send Webex link  for next session. Alison Brock was advised to call back or seek an in-person evaluation if the symptoms worsen or if the condition fails to improve as anticipated.   Collaboration of Care:   Medication Management AEB Dr. Fatima Sanger or Ricky Ala, NP                                          Case Manager AEB Dellia Nims, CNA   Patient/Guardian was advised Release of Information must be obtained prior to any record release in order to collaborate their care with an outside provider. Patient/Guardian was advised if they have not already done so to contact the registration department to sign all necessary forms in order for [place] to release information regarding their care.   Consent: Patient/Guardian gives verbal consent for treatment and assignment of benefits for services provided during this visit. Patient/Guardian expressed understanding and agreed to proceed.  I provided 180 minutes of non-face-to-face time during this encounter.   Shade Flood, LCSW, LCAS 11/21/22

## 2022-11-24 ENCOUNTER — Telehealth (HOSPITAL_COMMUNITY): Payer: Self-pay | Admitting: Psychiatry

## 2022-11-24 ENCOUNTER — Other Ambulatory Visit (HOSPITAL_COMMUNITY): Payer: 59 | Attending: Licensed Clinical Social Worker | Admitting: Psychiatry

## 2022-11-24 DIAGNOSIS — F411 Generalized anxiety disorder: Secondary | ICD-10-CM | POA: Diagnosis not present

## 2022-11-24 DIAGNOSIS — F329 Major depressive disorder, single episode, unspecified: Secondary | ICD-10-CM | POA: Insufficient documentation

## 2022-11-24 DIAGNOSIS — F410 Panic disorder [episodic paroxysmal anxiety] without agoraphobia: Secondary | ICD-10-CM | POA: Diagnosis not present

## 2022-11-24 NOTE — Progress Notes (Signed)
Virtual Visit via Video Note   I connected with Alison Brock, who prefers to go by "Alison Brock" on 11/24/22 at  9:00 AM EDT by a video enabled telemedicine application and verified that I am speaking with the correct person using two identifiers.   At orientation to the IOP program, Case Manager discussed the limitations of evaluation and management by telemedicine and the availability of in person appointments. The patient expressed understanding and agreed to proceed with virtual visits throughout the duration of the program.   Location:  Patient: Patient Home Provider: [facility]   History of Present Illness: MDD and GAD  Observations/Objective: Check In: Case Manager checked in with all participants to review discharge dates, insurance authorizations, work-related documents and needs from the treatment team regarding medications. Alison Brock stated needs and engaged in discussion.    Initial Therapeutic Activity: Counselor facilitated a check-in with Alison Brock to assess for safety, sobriety and medication compliance.  Counselor also inquired about Alison Brock's current emotional ratings, as well as any significant changes in thoughts, feelings or behavior since previous check in.  Alison Brock presented for session on time and was alert, oriented x5, with no evidence or self-report of active SI/HI or A/V H.  Alison Brock reported compliance with medication and denied use of alcohol or illicit substances.  Alison Brock reported scores of 1/10 for depression, 3/10 for anxiety, and 0/10 for anger/irritability.  Alison Brock denied any recent outbursts.  Alison Brock reported that a recent success was getting out of the house to play with a dog her mother's friend owns.  Alison Brock reported that a recent struggle was experiencing a panic attack last night, stating "I think it was a normal mix of that nighttime anxiety".  Alison Brock reported that her goal today is to find some self-care activities to do to preoccupy her time since she will be alone for  longer than she is used to.         Second Therapeutic Activity: Counselor covered topic of conflict resolution today.  Counselor virtually shared a handout on subject with members which warned against the 'four horsemen' of communication traps that should be avoided due to tendency to escalate and damage a relationship.  These included criticism, defensiveness, contempt, and stonewalling.  'Antidotes' to these harmful behaviors were offered as healthy replacements to improve communication and understanding, including approaching problems with a gentle startup approach, taking responsibility for one's behavior, sharing fondness/admiration, and using self-soothing to calm down and focus on the problem at hand.  Counselor encouraged members to share recent experiences with conflict that they have faced, which approach they utilized, and any changes that they would plan to implement in order to improve overall conflict resolution skills.  Intervention was effective, as evidenced by Alison Brock actively participating in discussion on topic, reporting that she has engaged in some of these communication traps, including defensiveness and criticism.  Alison Brock reported that she perceives these are areas of improvement to work on while in group therapy, stated "It was worse in the past, but I've been getting better.  I'm trying to avoid talking too loudly when there is a problem and avoid deflecting when I know I'm wrong.  No one wants to admit when they're wrong, especially when you feel like you've been wronged too".  Alison Brock expressed receptiveness to alternative strategies offered in order to improve conflict resolution skills, including using "I" statements to express her feelings about an issue with less chance of perceived judgment from the other party, in addition to using feedback from  others as an opportunity to change her own behaviors.  She stated "I could definitely be more understanding".     Assessment and  Plan: Counselor recommends that Alison Brock remain in IOP treatment to better manage mental health symptoms, ensure stability and pursue completion of treatment plan goals. Counselor recommends adherence to crisis/safety plan, taking medications as prescribed, and following up with medical professionals if any issues arise.   Follow Up Instructions: Counselor will send Webex link for next session. Alison Brock was advised to call back or seek an in-person evaluation if the symptoms worsen or if the condition fails to improve as anticipated.   Collaboration of Care:   Medication Management AEB Dr. Fatima Sanger or Ricky Ala, NP                                          Case Manager AEB Dellia Nims, CNA   Patient/Guardian was advised Release of Information must be obtained prior to any record release in order to collaborate their care with an outside provider. Patient/Guardian was advised if they have not already done so to contact the registration department to sign all necessary forms in order for [place] to release information regarding their care.   Consent: Patient/Guardian gives verbal consent for treatment and assignment of benefits for services provided during this visit. Patient/Guardian expressed understanding and agreed to proceed.  I provided 180 minutes of non-face-to-face time during this encounter.   Shade Flood, LCSW, LCAS 11/24/22

## 2022-11-24 NOTE — Telephone Encounter (Signed)
D:  Pt phoned to see if she could attend a DBT group while attending MH-IOP?  "I've been on a wait list for [facility] and they just called and said that I received a sponsorship in order to attend."  According to pt, the group will meet once a week in the evening.               A:  Provided pt with support.  Informed pt that if it's not being covered by her insurance she should be fine with attending MH-IOP and DBT the same day.  Encouraged pt to make sure that her insurance isn't being used with the DBT group.  Inform MH-IOP team.  R:  Pt receptive.

## 2022-11-25 ENCOUNTER — Other Ambulatory Visit (HOSPITAL_COMMUNITY): Payer: 59 | Attending: Licensed Clinical Social Worker | Admitting: Licensed Clinical Social Worker

## 2022-11-25 DIAGNOSIS — F411 Generalized anxiety disorder: Secondary | ICD-10-CM

## 2022-11-25 DIAGNOSIS — F329 Major depressive disorder, single episode, unspecified: Secondary | ICD-10-CM

## 2022-11-25 NOTE — Progress Notes (Signed)
Virtual Visit via Video Note   I connected with Alison Brock, who prefers to go by "Alison Brock" on 11/25/22 at  9:00 AM EDT by a video enabled telemedicine application and verified that I am speaking with the correct person using two identifiers.   At orientation to the IOP program, Case Manager discussed the limitations of evaluation and management by telemedicine and the availability of in person appointments. The patient expressed understanding and agreed to proceed with virtual visits throughout the duration of the program.   Location:  Patient: Patient Home Provider: [facility] [place] [facility]   History of Present Illness: MDD and GAD  Observations/Objective: Check In: Case Manager checked in with all participants to review discharge dates, insurance authorizations, work-related documents and needs from the treatment team regarding medications. Alison Brock stated needs and engaged in discussion.    Initial Therapeutic Activity: Counselor facilitated a check-in with Alison Brock to assess for safety, sobriety and medication compliance.  Counselor also inquired about Alison Brock's current emotional ratings, as well as any significant changes in thoughts, feelings or behavior since previous check in.  Alison Brock presented for session on time and was alert, oriented x5, with no evidence or self-report of active SI/HI or A/V H.  Alison Brock reported compliance with medication and denied use of alcohol or illicit substances.  Alison Brock reported scores of 2/10 for depression, 2/10 for anxiety, and 0/10 for anger/irritability.  Alison Brock denied any recent outbursts or panic attacks.  Alison Brock reported that a recent success was having a friend come over to visit yesterday evening, and once again having no evening panic attacks.  Alison Brock denied any new struggles at this time.  Alison Brock reported that her goal today is to spend some time outside when the sun is warmest in order to curb isolation.       Second Therapeutic Activity: Counselor provided demonstration of  relaxation technique known as mindful breathing meditation to help members increase sense of calm, resiliency, and control.  Counselor guided members through process of getting comfortable, achieving a relaxed breathing rhythm, and focusing on this for several minutes, allowing troubling thoughts and feelings to come and go without rumination.  Counselor processed effectiveness of activity afterward in discussion with members, including how this impacted their mental state, whether it was difficult to stay focused, and if they plan to include it in self-care routine to improve day-to-day coping.  Intervention was effective, as evidenced by [employer] participating in exercise, and reporting that she found it relaxing and would plan to practice it to help address ongoing anxiety and panic attacks.     Third Therapeutic Activity: Counselor introduced [employer], [place] Chaplain to provide psychoeducation on topic of Grief and Loss with members today.  Alison Brock began discussion by checking in with the group about their baseline mood today, general thoughts on what grief means to them and how it has affected them personally in the past.  Alison Brock provided information on how the process of grief/loss can differ depending upon one's unique culture, and categories of loss one could experience (i.e. loss of a person, animal, relationship, job, identity, etc).  Alison Brock encouraged members to be mindful of how pervasive loss can be, and how to recognize signs which could indicate that this is having an impact on one's overall mental health and wellbeing.  Intervention effectiveness could not be measured, as Alison Brock did not participate in discussion.    Assessment and Plan: Counselor recommends that Alison Brock remain in IOP treatment to better manage mental health symptoms, ensure stability  and pursue completion of treatment plan goals. Counselor recommends adherence to crisis/safety plan, taking medications as prescribed, and  following up with medical professionals if any issues arise.   Follow Up Instructions: Counselor will send Webex link for next session. Alison Brock was advised to call back or seek an in-person evaluation if the symptoms worsen or if the condition fails to improve as anticipated.   Collaboration of Care:   Medication Management AEB Dr. Fatima Sanger or Ricky Ala, NP                                          Case Manager AEB Dellia Nims, CNA   Patient/Guardian was advised Release of Information must be obtained prior to any record release in order to collaborate their care with an outside provider. Patient/Guardian was advised if they have not already done so to contact the registration department to sign all necessary forms in order for [place] to release information regarding their care.   Consent: Patient/Guardian gives verbal consent for treatment and assignment of benefits for services provided during this visit. Patient/Guardian expressed understanding and agreed to proceed.  I provided 180 minutes of non-face-to-face time during this encounter.   Alison Flood, LCSW, LCAS 11/25/22

## 2022-11-26 ENCOUNTER — Other Ambulatory Visit (HOSPITAL_COMMUNITY): Payer: 59 | Attending: Licensed Clinical Social Worker | Admitting: Licensed Clinical Social Worker

## 2022-11-26 DIAGNOSIS — F411 Generalized anxiety disorder: Secondary | ICD-10-CM | POA: Diagnosis not present

## 2022-11-26 DIAGNOSIS — F329 Major depressive disorder, single episode, unspecified: Secondary | ICD-10-CM | POA: Diagnosis not present

## 2022-11-26 NOTE — Progress Notes (Signed)
Virtual Visit via Video Note   I connected with Alison Brock, who prefers to go by "Alison Brock" on 11/26/22 at  9:00 AM EDT by a video enabled telemedicine application and verified that I am speaking with the correct person using two identifiers.   At orientation to the IOP program, Case Manager discussed the limitations of evaluation and management by telemedicine and the availability of in person appointments. The patient expressed understanding and agreed to proceed with virtual visits throughout the duration of the program.   Location:  Patient: Patient Home Provider: [facility] [place] [facility]   History of Present Illness: MDD and GAD  Observations/Objective: Check In: Case Manager checked in with all participants to review discharge dates, insurance authorizations, work-related documents and needs from the treatment team regarding medications. Alison Brock stated needs and engaged in discussion.    Initial Therapeutic Activity: Counselor facilitated a check-in with Alison Brock to assess for safety, sobriety and medication compliance.  Counselor also inquired about Alison Brock's current emotional ratings, as well as any significant changes in thoughts, feelings or behavior since previous check in.  Alison Brock presented for session on time and was alert, oriented x5, with no evidence or self-report of active SI/HI or A/V H.  Alison Brock reported compliance with medication and denied use of alcohol or illicit substances.  Alison Brock reported scores of 2/10 for depression, 6/10 for anxiety, and 0/10 for anger/irritability.  Alison Brock denied any recent outbursts or panic attacks.  Alison Brock reported that a recent success was outreaching her grandmother by phone yesterday, stating "We talked for an hour.  It was nice".  Alison Brock reported that a recent struggle was dealing with anxiety related to being home alone this morning.  Alison Brock reported that her goal today is to do some laundry to be productive.         Second Therapeutic Activity: Counselor offered to  teach group members an ACT relaxation technique today to aid in managing difficult thoughts, feelings, urges, and sensations.  Counselor guided members through process of getting comfortable, achieving relaxing breathing rhythm, and then maintaining this throughout activity.  Counselor invited members to imagine a gently flowing stream in their mind with leaves floating upon it, and when any thoughts, feelings, urges, or sensations arose, good or bad, they were instructed to visualize placing them on these passing leaves over course of practice.  Intervention was effective, as evidenced by Alison Brock successfully participating in activity and reporting that she found it calming and felt it could be useful for dealing with anxiety and panic attacks if practiced regularly.     Assessment and Plan: Counselor recommends that Alison Brock remain in IOP treatment to better manage mental health symptoms, ensure stability and pursue completion of treatment plan goals. Counselor recommends adherence to crisis/safety plan, taking medications as prescribed, and following up with medical professionals if any issues arise.   Follow Up Instructions: Counselor will send Webex link for next session. Alison Brock was advised to call back or seek an in-person evaluation if the symptoms worsen or if the condition fails to improve as anticipated.   Collaboration of Care:   Medication Management AEB Dr. Fatima Sanger or Ricky Ala, NP                                          Case Manager AEB Dellia Nims, CNA   Patient/Guardian was advised Release of Information must be obtained prior to any  record release in order to collaborate their care with an outside provider. Patient/Guardian was advised if they have not already done so to contact the registration department to sign all necessary forms in order for [place] to release information regarding their care.   Consent: Patient/Guardian gives verbal consent for treatment and assignment of benefits  for services provided during this visit. Patient/Guardian expressed understanding and agreed to proceed.  I provided 180 minutes of non-face-to-face time during this encounter.   Shade Flood, LCSW, LCAS 11/26/22

## 2022-11-27 ENCOUNTER — Ambulatory Visit (HOSPITAL_COMMUNITY): Payer: Self-pay | Admitting: Psychiatry

## 2022-11-27 ENCOUNTER — Telehealth (HOSPITAL_COMMUNITY): Payer: Self-pay | Admitting: Psychiatry

## 2022-11-27 DIAGNOSIS — G40909 Epilepsy, unspecified, not intractable, without status epilepticus: Secondary | ICD-10-CM | POA: Diagnosis not present

## 2022-11-27 DIAGNOSIS — G40B09 Juvenile myoclonic epilepsy, not intractable, without status epilepticus: Secondary | ICD-10-CM | POA: Diagnosis not present

## 2022-11-28 ENCOUNTER — Other Ambulatory Visit (HOSPITAL_COMMUNITY): Payer: 59 | Attending: Licensed Clinical Social Worker | Admitting: Psychiatry

## 2022-11-28 DIAGNOSIS — F329 Major depressive disorder, single episode, unspecified: Secondary | ICD-10-CM | POA: Diagnosis not present

## 2022-11-28 DIAGNOSIS — F411 Generalized anxiety disorder: Secondary | ICD-10-CM | POA: Insufficient documentation

## 2022-11-28 NOTE — Progress Notes (Signed)
Virtual Visit via Video Note   I connected with Alison Brock, who prefers to go by "Alison Brock" on 11/28/22 at  9:00 AM EDT by a video enabled telemedicine application and verified that I am speaking with the correct person using two identifiers.   At orientation to the IOP program, Case Manager discussed the limitations of evaluation and management by telemedicine and the availability of in person appointments. The patient expressed understanding and agreed to proceed with virtual visits throughout the duration of the program.   Location:  Patient: Patient Home Provider: [facility]   History of Present Illness: MDD and GAD  Observations/Objective: Check In: Case Manager checked in with all participants to review discharge dates, insurance authorizations, work-related documents and needs from the treatment team regarding medications. Alison Brock stated needs and engaged in discussion.    Initial Therapeutic Activity: Counselor facilitated a check-in with Alison Brock to assess for safety, sobriety and medication compliance.  Counselor also inquired about Alison Brock's current emotional ratings, as well as any significant changes in thoughts, feelings or behavior since previous check in.  Alison Brock presented for session on time and was alert, oriented x5, with no evidence or self-report of active SI/HI or A/V H.  Alison Brock reported compliance with medication and denied use of alcohol or illicit substances.  Alison Brock reported scores of 3/10 for depression, 2/10 for anxiety, and 2/10 for anger/irritability.  Alison Brock denied any recent outbursts or panic attacks.  Alison Brock reported that a recent success was having an EEG done yesterday at a medical appointment and managing anxiety well during this process.  Alison Brock reported that a recent struggle was getting into an argument with her boyfriend, although they were able to resolve this later when things calmed down.  Alison Brock reported that her goal today is to consider whether she feels  confident enough to get out of the house to spend time with her boyfriend.         Second Therapeutic Activity: Psycho-educational portion of group was provided by Christie Beckers, [employer] of community education with [employer].  Alison Brock provided information on history of her local agency, mission statement, and the variety of unique services offered which group members might find beneficial to engage in, including both virtual and in-person support groups, as well as peer support program for mentoring.  Alison Brock offered time to answer member's questions regarding services and encouraged them to consider utilizing these services to assist in working towards their individual wellness goals.  Intervention effectiveness could not be measured, as Alison Brock did not participate.    Third Therapeutic Activity: Counselor introduced topic of creating mental health maintenance plan today.  Counselor provided handout on subject to members, which stressed the importance of maintaining one's mental health in a similar way to using diet and exercise to ensure physical health.  Counselor walked members through process of identifying triggers which could worsen symptoms, including specific people, places, and things one needs to avoid.  Members were also tasked with identifying warning signs such as thoughts, feelings, or behaviors which could indicate mental health is at increased risk.  Counselor also facilitated conversation on self-care activities and coping strategies which members have previously utilized in the past, are currently using in daily routine, or plan to use soon to assist with managing problems or symptoms when/if they appear.  Counselor encouraged members to revisit their maintenance plan often and make changes as needed to ensure day to day stability.  Intervention was effective, as evidenced by [employer] participating in activity and  creating a comprehensive plan, including identification of triggers  such as being in crowded public spaces where she doesn't feel safe, driving somewhere alone, and warning signs including excessively sleeping into the day, having nightmares, isolating from others, and neglecting personal hygiene.  Alison Brock also reported that she would make an effort to set aside time for self-care activities such as spending more time outside in nature, and use coping skills such as splashing her face with cold water to ground herself in order to manage stressors.    Assessment and Plan: Counselor recommends that Alison Brock remain in IOP treatment to better manage mental health symptoms, ensure stability and pursue completion of treatment plan goals. Counselor recommends adherence to crisis/safety plan, taking medications as prescribed, and following up with medical professionals if any issues arise.   Follow Up Instructions: Counselor will send Webex link for next session. Alison Brock was advised to call back or seek an in-person evaluation if the symptoms worsen or if the condition fails to improve as anticipated.   Collaboration of Care:   Medication Management AEB Dr. Fatima Sanger or Ricky Ala, NP                                          Case Manager AEB Dellia Nims, CNA   Patient/Guardian was advised Release of Information must be obtained prior to any record release in order to collaborate their care with an outside provider. Patient/Guardian was advised if they have not already done so to contact the registration department to sign all necessary forms in order for [place] to release information regarding their care.   Consent: Patient/Guardian gives verbal consent for treatment and assignment of benefits for services provided during this visit. Patient/Guardian expressed understanding and agreed to proceed.  I provided 180 minutes of non-face-to-face time during this encounter.   Shade Flood, LCSW, LCAS 11/28/22

## 2022-12-01 ENCOUNTER — Other Ambulatory Visit (HOSPITAL_COMMUNITY): Payer: 59 | Attending: Licensed Clinical Social Worker | Admitting: Licensed Clinical Social Worker

## 2022-12-01 DIAGNOSIS — F411 Generalized anxiety disorder: Secondary | ICD-10-CM | POA: Insufficient documentation

## 2022-12-01 DIAGNOSIS — F329 Major depressive disorder, single episode, unspecified: Secondary | ICD-10-CM | POA: Diagnosis not present

## 2022-12-01 NOTE — Progress Notes (Signed)
Virtual Visit via Video Note   I connected with Alison Brock, who prefers to go by "Alison Brock" on 12/01/22 at  9:00 AM EDT by a video enabled telemedicine application and verified that I am speaking with the correct person using two identifiers.   At orientation to the IOP program, Case Manager discussed the limitations of evaluation and management by telemedicine and the availability of in person appointments. The patient expressed understanding and agreed to proceed with virtual visits throughout the duration of the program.   Location:  Patient: Patient Home Provider: [facility]    History of Present Illness: MDD and GAD  Observations/Objective: Check In: Case Manager checked in with all participants to review discharge dates, insurance authorizations, work-related documents and needs from the treatment team regarding medications. Alison Brock stated needs and engaged in discussion.    Initial Therapeutic Activity: Counselor facilitated a check-in with Alison Brock to assess for safety, sobriety and medication compliance.  Counselor also inquired about Alison Brock current emotional ratings, as well as any significant changes in thoughts, feelings or behavior since previous check in.  Alison Brock presented for session on time and was alert, oriented x5, with no evidence or self-report of active SI/HI or A/V H.  Alison Brock reported compliance with medication and denied use of alcohol or illicit substances.  Alison Brock reported scores of 0/10 for depression, 2/10 for anxiety, and 0/10 for anger/irritability.  Alison Brock denied any recent outbursts or panic attacks.  Alison Brock reported that a recent success was watching the super bowl with family over the weekend, stating "I didn't do much or go anywhere".  Alison Brock denied any new challenges at this time.  Alison Brock reported that her goal today is to work on a gift for her boyfriend's valentines day.    Second Therapeutic Activity: Counselor engaged the group in discussion on managing  work/life balance today to improve mental health and wellness.  Counselor explained how finding balance between responsibilities at home and work place can be challenging, and lead to increased stress.  Counselor facilitated discussion on what challenges members are currently, or have historically faced.  Counselor also discussed strategies for improving work/life balance while members work on their mental health during treatment.  Some of these included keeping track of time management; creating a list of priorities and scaling importance; setting realistic, measurable goals each day; establishing boundaries; taking care of health needs; and nurturing relationships at home and work for support.  Counselor inquired about areas where members feel they are excelling, as well as areas they could focus on during treatment. Intervention was effective, as evidenced by Alison Brock actively participating in discussion on topic and reporting that although she is not currently working, she found this to be a helpful topic to cover, since she has experienced several symptoms of burnout, including lack of motivation, and feeling overwhelmed.  Alison Brock reported that she believes stress can greatly influence to frequency and severity of her seizures, so her plan is to establish healthier boundaries and prioritize more time for self-care activities to ensure a healthy outlet for stress.  Alison Brock requested a virtual copy of this handout to review as she considers an appropriate workplace environment that would support her mental health goals.    Assessment and Plan: Counselor recommends that Alison Brock remain in IOP treatment to better manage mental health symptoms, ensure stability and pursue completion of treatment plan goals. Counselor recommends adherence to crisis/safety plan, taking medications as prescribed, and following up with medical professionals if any issues arise.   Follow  Up Instructions: Counselor will send Webex link for  next session. Alison Brock was advised to call back or seek an in-person evaluation if the symptoms worsen or if the condition fails to improve as anticipated.   Collaboration of Care:   Medication Management AEB Dr. Fatima Sanger or Ricky Ala, NP                                          Case Manager AEB Dellia Nims, CNA   Patient/Guardian was advised Release of Information must be obtained prior to any record release in order to collaborate their care with an outside provider. Patient/Guardian was advised if they have not already done so to contact the registration department to sign all necessary forms in order for [place] to release information regarding their care.   Consent: Patient/Guardian gives verbal consent for treatment and assignment of benefits for services provided during this visit. Patient/Guardian expressed understanding and agreed to proceed.  I provided 180 minutes of non-face-to-face time during this encounter.   Shade Flood, LCSW, LCAS 12/01/22

## 2022-12-02 ENCOUNTER — Ambulatory Visit (HOSPITAL_COMMUNITY): Payer: Self-pay | Admitting: Licensed Clinical Social Worker

## 2022-12-03 ENCOUNTER — Other Ambulatory Visit (HOSPITAL_COMMUNITY): Payer: 59 | Attending: Licensed Clinical Social Worker | Admitting: Licensed Clinical Social Worker

## 2022-12-03 DIAGNOSIS — F329 Major depressive disorder, single episode, unspecified: Secondary | ICD-10-CM | POA: Insufficient documentation

## 2022-12-03 DIAGNOSIS — F411 Generalized anxiety disorder: Secondary | ICD-10-CM | POA: Insufficient documentation

## 2022-12-03 NOTE — Progress Notes (Signed)
Virtual Visit via Video Note   I connected with Alison Brock, who prefers to go by "Alison Brock" on 12/03/22 at  9:00 AM EDT by a video enabled telemedicine application and verified that I am speaking with the correct person using two identifiers.   At orientation to the IOP program, Case Manager discussed the limitations of evaluation and management by telemedicine and the availability of in person appointments. The patient expressed understanding and agreed to proceed with virtual visits throughout the duration of the program.   Location:  Patient: Patient Home Provider: [facility] [place] [facility]   History of Present Illness: MDD and GAD  Observations/Objective: Check In: Case Manager checked in with all participants to review discharge dates, insurance authorizations, work-related documents and needs from the treatment team regarding medications. Alison Brock stated needs and engaged in discussion.    Initial Therapeutic Activity: Counselor facilitated a check-in with Alison Brock to assess for safety, sobriety and medication compliance.  Counselor also inquired about Alison Brock's current emotional ratings, as well as any significant changes in thoughts, feelings or behavior since previous check in.  Alison Brock presented for session on time and was alert, oriented x5, with no evidence or self-report of active SI/HI or A/V H.  Alison Brock reported compliance with medication and denied use of alcohol or illicit substances.  Alison Brock reported scores of 0/10 for depression, 3/10 for anxiety, and 0/10 for anger/irritability.  Alison Brock reported that a recent struggle was missing group yesterday due to nausea, in addition to experiencing outbursts and panic attacks later on. She stated "I was getting stressed out about how Valentines Day would go, and when I talked to my mom it got me more upset".  She reported that they were able to resolve this later when things calmed down.  Alison Brock reported that her goal today is to celebrate Valentine's Day with her  boyfriend.        Second Therapeutic Activity: Counselor introduced Einar Grad, [employer] Pharmacist, to provide psychoeducation on topic of medication compliance with members today.  Alison Brock provided psychoeducation on classes of medications such as antidepressants, antipsychotics, what symptoms they are intended to treat, and any side effects one might encounter while on a particular prescription.  Time was allowed for clients to ask any questions they might have of [facility] regarding this specialty.  Intervention effectiveness could not be measured, as Alison Brock did not participate.    Third Therapeutic Activity: Counselor also acknowledged a graduating group member by prompting this member to reflect on progress made since beginning the [facility] program, notable takeaways from sessions attended, challenges overcome, and plan for continued care following discharge. Counselor and group members shared observations of growth, words of encouragement and support as this member transitioned out of the program today.  Intervention was effective, as evidenced by [employer] participating in activity, reporting that she appreciated what the graduating member had to share in group each day, as it helped reassure her that she wasn't alone in facing common struggles, and made her feel more comfortable opening up about her own problems for feedback.    Assessment and Plan: Counselor recommends that Alison Brock remain in IOP treatment to better manage mental health symptoms, ensure stability and pursue completion of treatment plan goals. Counselor recommends adherence to crisis/safety plan, taking medications as prescribed, and following up with medical professionals if any issues arise.   Follow Up Instructions: Counselor will send Webex link for next session. Alison Brock was advised to call back or seek an in-person evaluation if the symptoms worsen or if  the condition fails to improve as anticipated.   Collaboration of Care:   Medication  Management AEB Dr. Fatima Sanger or Ricky Ala, NP                                          Case Manager AEB Dellia Nims, CNA   Patient/Guardian was advised Release of Information must be obtained prior to any record release in order to collaborate their care with an outside provider. Patient/Guardian was advised if they have not already done so to contact the registration department to sign all necessary forms in order for Alison Brock to release information regarding their care.   Consent: Patient/Guardian gives verbal consent for treatment and assignment of benefits for services provided during this visit. Patient/Guardian expressed understanding and agreed to proceed.  I provided 180 minutes of non-face-to-face time during this encounter.   Shade Flood, LCSW, LCAS 12/03/22

## 2022-12-04 ENCOUNTER — Other Ambulatory Visit (HOSPITAL_COMMUNITY): Payer: 59 | Attending: Licensed Clinical Social Worker | Admitting: Licensed Clinical Social Worker

## 2022-12-04 DIAGNOSIS — F329 Major depressive disorder, single episode, unspecified: Secondary | ICD-10-CM | POA: Diagnosis not present

## 2022-12-04 DIAGNOSIS — F603 Borderline personality disorder: Secondary | ICD-10-CM | POA: Insufficient documentation

## 2022-12-04 DIAGNOSIS — F411 Generalized anxiety disorder: Secondary | ICD-10-CM | POA: Insufficient documentation

## 2022-12-04 NOTE — Progress Notes (Signed)
Virtual Visit via Video Note   I connected with Alison Brock, who prefers to go by "Alison Brock" on 12/04/22 at  9:00 AM EDT by a video enabled telemedicine application and verified that I am speaking with the correct person using two identifiers.   At orientation to the IOP program, Case Manager discussed the limitations of evaluation and management by telemedicine and the availability of in person appointments. The patient expressed understanding and agreed to proceed with virtual visits throughout the duration of the program.   Location:  Patient: Patient Home Provider: [facility] [place] [facility]   History of Present Illness: MDD, GAD, and Borderline Personality D/O  Observations/Objective: Check In: Case Manager checked in with all participants to review discharge dates, insurance authorizations, work-related documents and needs from the treatment team regarding medications. Alison Brock stated needs and engaged in discussion.    Initial Therapeutic Activity: Counselor facilitated a check-in with Alison Brock to assess for safety, sobriety and medication compliance.  Counselor also inquired about Alison Brock's current emotional ratings, as well as any significant changes in thoughts, feelings or behavior since previous check in.  Alison Brock presented for session on time and was alert, oriented x5, with no evidence or self-report of active SI/HI or A/V H.  Alison Brock reported compliance with medication and denied use of alcohol or illicit substances.  Alison Brock reported scores of 0/10 for depression, 2/10 for anxiety, and 0/10 for anger/irritability.  Alison Brock denied any recent outbursts or panic attacks.  Alison Brock reported that a recent success was exchanging gifts with her boyfriend yesterday and getting a pizza for valentines day dinner, stating "It was nice".  Alison Brock reported that a recent struggle was experiencing an 'anxiety attack' when picking up a package from her grandmothers house yesterday, although she was able to practice her grounding  skills to calm down.  Alison Brock reported that her goal today is to complete some paperwork for our case manager, and attend a DBT skills group.        Second Therapeutic Activity: Counselor utilized a Radio broadcast assistant with group members today to guide discussion on topic of codependency.  This handout defined codependency as excessive emotional or psychological reliance upon someone who requires support on account of an illness or addiction.  It also explained how this issue presents in dysfunctional family systems, including behavior such as denying existence of problems, rigid boundaries on communication, strained trust, lack of individuality, and reinforcement of unhealthy coping mechanisms such as substance use.  Characteristics of co-dependent people were listed for assistance with identification, such as extreme need for approval/recognition, difficulty identifying feelings, poor communication, and more.  Members were also tasked with completing a questionnaire in order to identify signs of codependency and results were discussed afterward.  This handout also offered strategies for resolving co-dependency within one's network, including increased use of assertive communication skills in order to set appropriate boundaries.  Intervention was effective, as evidenced by Alison Brock actively participating in discussion on the subject, and completing codependency questionnaire, with 11 out of 20 positive responses.  Alison Brock reported that she was surprised by her results, as she does struggle with feelings of abandonment and doing activities independently from her family and partner, but felt like this wasn't that significant of an issue for her prior to discussion.  Alison Brock reported that she has been diagnosed with borderline personality disorder, and has trouble respecting other's boundaries at times, so her goal is to continue with DBT support groups so that she can learn tailored skills to help resolve this issue,  and  improve relationships within her network.     Assessment and Plan: Counselor recommends that Alison Brock remain in IOP treatment to better manage mental health symptoms, ensure stability and pursue completion of treatment plan goals. Counselor recommends adherence to crisis/safety plan, taking medications as prescribed, and following up with medical professionals if any issues arise.   Follow Up Instructions: Counselor will send Webex link for next session. Alison Brock was advised to call back or seek an in-person evaluation if the symptoms worsen or if the condition fails to improve as anticipated.   Collaboration of Care:   Medication Management AEB Dr. Fatima Sanger or Ricky Ala, NP                                          Case Manager AEB Dellia Nims, CNA   Patient/Guardian was advised Release of Information must be obtained prior to any record release in order to collaborate their care with an outside provider. Patient/Guardian was advised if they have not already done so to contact the registration department to sign all necessary forms in order for [place] to release information regarding their care.   Consent: Patient/Guardian gives verbal consent for treatment and assignment of benefits for services provided during this visit. Patient/Guardian expressed understanding and agreed to proceed.  I provided 180 minutes of non-face-to-face time during this encounter.   Shade Flood, LCSW, LCAS 12/04/22

## 2022-12-05 ENCOUNTER — Other Ambulatory Visit (HOSPITAL_COMMUNITY): Payer: 59 | Attending: Licensed Clinical Social Worker | Admitting: Psychiatry

## 2022-12-05 DIAGNOSIS — F603 Borderline personality disorder: Secondary | ICD-10-CM | POA: Diagnosis not present

## 2022-12-05 DIAGNOSIS — F329 Major depressive disorder, single episode, unspecified: Secondary | ICD-10-CM | POA: Diagnosis not present

## 2022-12-05 DIAGNOSIS — F411 Generalized anxiety disorder: Secondary | ICD-10-CM | POA: Insufficient documentation

## 2022-12-05 NOTE — Progress Notes (Signed)
Virtual Visit via Video Note   I connected with Alison Brock, who prefers to go by "Alison Brock" on 12/05/22 at  9:00 AM EDT by a video enabled telemedicine application and verified that I am speaking with the correct person using two identifiers.   At orientation to the IOP program, Case Manager discussed the limitations of evaluation and management by telemedicine and the availability of in person appointments. The patient expressed understanding and agreed to proceed with virtual visits throughout the duration of the program.   Location:  Patient: Patient Home Provider: [facility] [place] [facility]   History of Present Illness: MDD and GAD and Borderline Personality Disorder  Observations/Objective: Check In: Case Manager checked in with all participants to review discharge dates, insurance authorizations, work-related documents and needs from the treatment team regarding medications. [name] stated needs and engaged in discussion.    Initial Therapeutic Activity: Counselor facilitated a check-in with [name] to assess for safety, sobriety and medication compliance.  Counselor also inquired about Alison Brock's current emotional ratings, as well as any significant changes in thoughts, feelings or behavior since previous check in.  Alison Brock presented for session on time and was alert, oriented x5, with no evidence or self-report of active SI/HI or A/V H.  Alison Brock reported compliance with medication and denied use of alcohol or illicit substances.  [name] reported scores of 0/10 for depression, 7/10 for anxiety, and 0/10 for anger/irritability.  [name] denied any recent outbursts or panic attacks.  [name] reported that a recent success was attending a DBT skills group last night, stating "That was pretty good".  [name] reported that a recent struggle was worrying about her mother today, who has surgery.  [name] reported that her goal today is to assist her mother after the surgery is complete.        Second Therapeutic Activity:  Counselor introduced topic of building a social support network today.  Counselor explained how this can be defined as having a having a group of healthy people in one's life you can talk to, spend time with, and get help from to improve both mental and physical health.  Counselor noted that some barriers can make it difficult to connect with other people, including the presence of anxiety or depression, or moving to an unfamiliar area.  Group members were asked to assess the current state of their support network, and identify ways that this could be improved.  Tips were given on how to address previously noted barriers, such as strengthening social skills, using relaxation techniques to reduce anxiety, scheduling social time each week, and/or exploring social events nearby which could increase chances of meeting new supports.  Members were also encouraged to consider getting closer to people they already know through suggestions such as outreaching someone by text, email or phone call if they haven't spoken in awhile, doing something nice for a friend/family member unexpectedly, and/or inviting someone over for a game/movie/dinner night.  Intervention was effective, as evidenced by [name] actively participating in discussion on the subject, and reporting that she has found it difficult to get out of the home for some time now due to anxiety, and this barrier has led to increase isolation from others.  [name] stated "I have 2 people I can really count on.  Personally I like it that way, but a few more people to count on could help me make sure I'm not overloading anyone like my mom".  She reported that her goal will be to work on desensitizing herself to social  settings in order to meet people through engagement in a rotating DBT skills group, signing up for an art class at a local store or school, volunteering at the [employer], or connecting with a church.     Assessment and Plan: Counselor recommends that  Alison Brock remain in IOP treatment to better manage mental health symptoms, ensure stability and pursue completion of treatment plan goals. Counselor recommends adherence to crisis/safety plan, taking medications as prescribed, and following up with medical professionals if any issues arise.   Follow Up Instructions: Counselor will send Webex link for next session. [name] was advised to call back or seek an in-person evaluation if the symptoms worsen or if the condition fails to improve as anticipated.   Collaboration of Care:   Medication Management AEB Dr. Fatima Sanger or Ricky Ala, NP                                          Case Manager AEB Dellia Nims, CNA   Patient/Guardian was advised Release of Information must be obtained prior to any record release in order to collaborate their care with an outside provider. Patient/Guardian was advised if they have not already done so to contact the registration department to sign all necessary forms in order for [place] to release information regarding their care.   Consent: Patient/Guardian gives verbal consent for treatment and assignment of benefits for services provided during this visit. Patient/Guardian expressed understanding and agreed to proceed.  I provided 180 minutes of non-face-to-face time during this encounter.   Shade Flood, LCSW, LCAS 12/05/22

## 2022-12-07 ENCOUNTER — Encounter (INDEPENDENT_AMBULATORY_CARE_PROVIDER_SITE_OTHER): Payer: Self-pay | Admitting: Neurology

## 2022-12-07 DIAGNOSIS — G40B09 Juvenile myoclonic epilepsy, not intractable, without status epilepticus: Secondary | ICD-10-CM | POA: Diagnosis not present

## 2022-12-07 NOTE — Procedures (Signed)
Patient:  Alison Brock   Sex: female  DOB:  05/12/2001   ROUTINE EXTENDED ELECTROENCEPHALOGRAM     PATIENT NAME: Alison Brock SEX: Female DATE OF BIRTH: Jul 20, 2001 STUDY NAME: Routine EXT EEG EEG: [id number] STUDY START DATE/TIME: 11/27/2022 1355 STUDY END DATE/TIME: 11/27/2022 1515 BILLING DAYS: Routine EXT EEG READING PHYSICIAN: Teressa Lower, MD. REFERRING PHYSICIAN: Teressa Lower, MD. TECHNOLOGIST: Rosealee Albee, R. EEG T. VIDEO: Yes EKG: Yes AUDIO: No DURATION: 71 minutes    MEDICATIONS:  Keppra, Amitriptyline, Ativan, Buspar, Prozac   CLINICAL NOTES This is a Routine Extended EEG study that was recorded on November 27, 2022 from 1355 to 1515. The patient was educated on the procedure prior to starting the study. The patient's head was measured and marked using the international 10/20 system, 23 channel digital bipolar EEG connections (over temporal over parasagittal montage). Additional channels for EOG and EKG. Recording was continuous and recorded in a bipolar montage that can be re-montaged. Calibration and impedances were recorded in all channels at [facility].   HYPERVENTILATION Hyperventilation was not performed.    PHOTIC STIMULATION Photic Stimulation was not performed.    HISTORY This patient is a 22 year old right-handed female. Patient has a history of events consisting of discomfort in her stomach and chest with panic attacks and confusion with shivering and head pain. Study was ordered to evaluate for epileptiform activity to rule out PNES.  SLEEP FEATURES  There was no sleep captured during this recording.  SUMMARY The study was recorded for 71 minutes. The awake background consists of mixed frequencies of alpha and beta. A symmetrical Dominate background rhythm of 10 Hz with an average amplitude of 28-31 uV, predominately seen in the posterior regions was noted during waking hours. Occasional brief bursts of paroxysmal faster frequencies with  questionable frontal spike and slow wave (Left > Right) was observed. There was no sleep captured during the recording. All and any possible abnormalities have been clipped for further review by the physician.    EVENTS There were no reported events logged by the technologist and no events noted on the recording.  EKG EKG was regular with a heart rate of 104-120 bpm at rest with no arrhythmias noted.     PHYSICIAN CONCLUSION/IMPRESSION:  This routine EEG for 71 minutes during awake state is slightly abnormal due to occasional sporadic brief discharges in the form of spikes and slow wave activity, mostly in the frontal area but no other epileptiform discharges or seizure activity or any transient rhythmic activity noted. The findings are consistent with brief focal cortical irritability with slight increased epileptic potential.  Clinical correlation is indicated.  See  _______________________________ Teressa Lower, MD.           12/07/2022      Teressa Lower, MD

## 2022-12-08 ENCOUNTER — Encounter (INDEPENDENT_AMBULATORY_CARE_PROVIDER_SITE_OTHER): Payer: Self-pay

## 2022-12-08 ENCOUNTER — Other Ambulatory Visit (HOSPITAL_COMMUNITY): Payer: 59 | Attending: Licensed Clinical Social Worker | Admitting: Licensed Clinical Social Worker

## 2022-12-08 DIAGNOSIS — F603 Borderline personality disorder: Secondary | ICD-10-CM

## 2022-12-08 DIAGNOSIS — F17290 Nicotine dependence, other tobacco product, uncomplicated: Secondary | ICD-10-CM | POA: Diagnosis not present

## 2022-12-08 DIAGNOSIS — G40909 Epilepsy, unspecified, not intractable, without status epilepticus: Secondary | ICD-10-CM | POA: Insufficient documentation

## 2022-12-08 DIAGNOSIS — F32A Depression, unspecified: Secondary | ICD-10-CM | POA: Insufficient documentation

## 2022-12-08 DIAGNOSIS — F411 Generalized anxiety disorder: Secondary | ICD-10-CM | POA: Diagnosis not present

## 2022-12-08 DIAGNOSIS — F329 Major depressive disorder, single episode, unspecified: Secondary | ICD-10-CM

## 2022-12-08 NOTE — Progress Notes (Signed)
Virtual Visit via Video Note   I connected with Alison Brock, who prefers to go by "Alison Brock" on 12/08/22 at  9:00 AM EDT by a video enabled telemedicine application and verified that I am speaking with the correct person using two identifiers.   At orientation to the IOP program, Case Manager discussed the limitations of evaluation and management by telemedicine and the availability of in person appointments. The patient expressed understanding and agreed to proceed with virtual visits throughout the duration of the program.   Location:  Patient: Patient Home Provider: [facility] [place] [facility]   History of Present Illness: MDD, GAD and Borderline Personality Disorder  Observations/Objective: Check In: Case Manager checked in with all participants to review discharge dates, insurance authorizations, work-related documents and needs from the treatment team regarding medications. Alison Brock stated needs and engaged in discussion.    Initial Therapeutic Activity: Counselor facilitated a check-in with Alison Brock to assess for safety, sobriety and medication compliance.  Counselor also inquired about Alison Brock's current emotional ratings, as well as any significant changes in thoughts, feelings or behavior since previous check in.  Alison Brock presented for session on time and was alert, oriented x5, with no evidence or self-report of active SI/HI or A/V H.  Alison Brock reported compliance with medication and denied use of alcohol or illicit substances.  Alison Brock reported scores of 6/10 for depression, 3/10 for anxiety, and 5/10 for anger/irritability.  Alison Brock denied any recent outbursts.  Alison Brock reported that a recent success was spending time with a friend on Saturday, doing her nails, and her friend's hair.  Alison Brock reported that a recent struggle was having a panic attack yesterday when she was feeling lonely and anxious, and asked her mother to reschedule an appointment to avoid being in the home by herself.  She stated "I wasn't feeling great  physically either, and that affected my anxiety".  Alison Brock reported that her goal today is to fold laundry, and use a face mask for self-care.         Second Therapeutic Activity: Counselor introduced topic of self-esteem today and defined this as the value an individual places on oneself, based upon assessment of personal worth as a human being and approval/disapproval of one's behavior. Counselor asked members to assess their level of self-esteem at this time based upon common indicators of high self-esteem, including: accepting oneself unconditionally;  having self-respect and deep seated belief that one matters; being unaffected by other people's opinions/criticisms; and showing good control over emotions.  Counselor also explained concept of one's inner critic which serves to highlight faults and minimize strengths, directly influencing low sense of self-esteem.  Counselor then provided handout on 'strengths and qualities', which featured questions to guide discussion and increase awareness of each member's unique individual abilities which could reinforce higher self-esteem. Examples of questions included: 'things I am good at', 'challenges I have overcome', and 'what I like about myself'.  Intervention was effective, as evidenced by Alison Brock actively engaging in discussion on topic, and completing a self-esteem assessment, receiving a score of 11, which indicated a 'diminished' level of self-esteem at this time due to traits such as being fearful of the future and worrying about rejection from others.  Alison Brock stated "I've had people tell me I'm positive, but it doesn't feel like it.  I think a lot of negativity is internalized towards myself".  Alison Brock was receptive to several strategies offered today for increasing self-esteem during treatment, including continuing with her DBT skills group in order to curb unhealthy dependence  on supports, prioritizing more time for self-care activities like going for walks or  exercise, and seeking feedback from positive supports that appreciate her.  Assessment and Plan: Counselor recommends that Alison Brock remain in IOP treatment to better manage mental health symptoms, ensure stability and pursue completion of treatment plan goals. Counselor recommends adherence to crisis/safety plan, taking medications as prescribed, and following up with medical professionals if any issues arise.   Follow Up Instructions: Counselor will send Webex link for next session. Alison Brock was advised to call back or seek an in-person evaluation if the symptoms worsen or if the condition fails to improve as anticipated.   Collaboration of Care:   Medication Management AEB Dr. Fatima Sanger or Ricky Ala, NP                                          Case Manager AEB Dellia Nims, CNA   Patient/Guardian was advised Release of Information must be obtained prior to any record release in order to collaborate their care with an outside provider. Patient/Guardian was advised if they have not already done so to contact the registration department to sign all necessary forms in order for [place] to release information regarding their care.   Consent: Patient/Guardian gives verbal consent for treatment and assignment of benefits for services provided during this visit. Patient/Guardian expressed understanding and agreed to proceed.  I provided 180 minutes of non-face-to-face time during this encounter.   Shade Flood, LCSW, LCAS 12/08/22

## 2022-12-09 ENCOUNTER — Other Ambulatory Visit (HOSPITAL_COMMUNITY): Payer: 59 | Admitting: Licensed Clinical Social Worker

## 2022-12-09 DIAGNOSIS — F17290 Nicotine dependence, other tobacco product, uncomplicated: Secondary | ICD-10-CM | POA: Diagnosis not present

## 2022-12-09 DIAGNOSIS — F603 Borderline personality disorder: Secondary | ICD-10-CM

## 2022-12-09 DIAGNOSIS — F329 Major depressive disorder, single episode, unspecified: Secondary | ICD-10-CM

## 2022-12-09 DIAGNOSIS — G40909 Epilepsy, unspecified, not intractable, without status epilepticus: Secondary | ICD-10-CM | POA: Diagnosis not present

## 2022-12-09 DIAGNOSIS — F411 Generalized anxiety disorder: Secondary | ICD-10-CM

## 2022-12-09 DIAGNOSIS — F32A Depression, unspecified: Secondary | ICD-10-CM | POA: Diagnosis not present

## 2022-12-09 NOTE — Telephone Encounter (Signed)
I called and spoke with Alison Brock. She is doing better and wants to cancel the at home prolonged EEG. I agreed with this plan and will cancel the order. TG

## 2022-12-09 NOTE — Progress Notes (Signed)
Virtual Visit via Video Note   I connected with Alison Brock, who prefers to go by "Alison Brock" on 12/09/22 at  9:00 AM EDT by a video enabled telemedicine application and verified that I am speaking with the correct person using two identifiers.   At orientation to the IOP program, Case Manager discussed the limitations of evaluation and management by telemedicine and the availability of in person appointments. The patient expressed understanding and agreed to proceed with virtual visits throughout the duration of the program.   Location:  Patient: Patient Home Provider: [facility] [place] [facility]   History of Present Illness: MDD and GAD and Borderline Personality Disorder  Observations/Objective: Check In: Case Manager checked in with all participants to review discharge dates, insurance authorizations, work-related documents and needs from the treatment team regarding medications. Alison Brock stated needs and engaged in discussion.    Initial Therapeutic Activity: Counselor facilitated a check-in with Alison Brock to assess for safety, sobriety and medication compliance.  Counselor also inquired about Alison Brock's current emotional ratings, as well as any significant changes in thoughts, feelings or behavior since previous check in.  Alison Brock presented for session on time and was alert, oriented x5, with no evidence or self-report of active SI/HI or A/V H.  Alison Brock reported compliance with medication and denied use of alcohol or illicit substances.  Alison Brock reported scores of 0/10 for depression, 2/10 for anxiety, and 0/10 for anger/irritability.  Alison Brock denied any recent outbursts or panic attacks.  Alison Brock reported that a recent success was having a good day yesterday, stating "I did a lot of cleaning and tidying up.  My space feels more calm and relaxing".  Alison Brock denied any new struggles at this time.  Alison Brock reported that her goal today is to run an errand with her boyfriend to get outside the home.       Second Therapeutic Activity:  Counselor introduced [employer], [place] Chaplain to provide psychoeducation on topic of Grief and Loss with members today.  Alison Brock began discussion by checking in with the group about their baseline mood today, general thoughts on what grief means to them and how it has affected them personally in the past.  Alison Brock provided information on how the process of grief/loss can differ depending upon one's unique culture, and categories of loss one could experience (i.e. loss of a person, animal, relationship, job, identity, etc).  Alison Brock encouraged members to be mindful of how pervasive loss can be, and how to recognize signs which could indicate that this is having an impact on one's overall mental health and wellbeing.  Intervention was effective, as evidenced by Alison Brock participating in discussion with speaker on the subject, reporting that she associates the idea of losing someone with fear and anxiety, which can be overwhelming for her to deal with, and trigger panic attacks at times.     Third Therapeutic Activity: Counselor introduced topic of self-care today.  Counselor explained how this can be defined as the things one does to maintain good health and improve well-being.  Counselor provided members with a self-care assessment form to complete.  This handout featured various sub-categories of self-care, including physical, psychological/emotional, social, spiritual, and professional.  Members were asked to rank their engagement in the activities listed for each dimension on a scale of 1-3, with 1 indicating 'Poor', 2 indicating '[place]', and 3 indicating 'Well'.  Counselor invited members to share results of their assessment, and inquired about which areas of self-care they are doing well in, as well as areas  that require attention, and how they plan to begin addressing this during treatment.  Intervention was effective, as evidenced by Alison Brock successfully completing initial 2 sections of assessment and actively  engaging in discussion on subject, reporting that she is excelling in areas such as eating regularly, sleeping well, and resting when sick, but would benefit from focusing more on areas such as eating healthy foods, exercising, wearing clothes that make her feel good, and participating in fun activities.  Alison Brock reported that she would work to improve self-care deficits by adjusting diet to include healthier options like fruits and vegetables; getting out of her bed more often and getting active to improve exercise, such as taking walks; and going shopping for a new wardrobe that boosts her self-esteem.    Assessment and Plan: Counselor recommends that Alison Brock remain in IOP treatment to better manage mental health symptoms, ensure stability and pursue completion of treatment plan goals. Counselor recommends adherence to crisis/safety plan, taking medications as prescribed, and following up with medical professionals if any issues arise.   Follow Up Instructions: Counselor will send Webex link for next session. Alison Brock was advised to call back or seek an in-person evaluation if the symptoms worsen or if the condition fails to improve as anticipated.   Collaboration of Care:   Medication Management AEB Dr. Fatima Sanger or Ricky Ala, NP                                          Case Manager AEB Dellia Nims, CNA   Patient/Guardian was advised Release of Information must be obtained prior to any record release in order to collaborate their care with an outside provider. Patient/Guardian was advised if they have not already done so to contact the registration department to sign all necessary forms in order for [place] to release information regarding their care.   Consent: Patient/Guardian gives verbal consent for treatment and assignment of benefits for services provided during this visit. Patient/Guardian expressed understanding and agreed to proceed.  I provided 180 minutes of non-face-to-face time during this  encounter.   Shade Flood, LCSW, LCAS 12/09/22

## 2022-12-09 NOTE — Telephone Encounter (Signed)
Left message to determine if he needs an order to cancel the prolonged ambulatory EEG or is a verbal ok.

## 2022-12-10 ENCOUNTER — Other Ambulatory Visit (HOSPITAL_COMMUNITY): Payer: 59 | Admitting: Licensed Clinical Social Worker

## 2022-12-10 DIAGNOSIS — F32A Depression, unspecified: Secondary | ICD-10-CM | POA: Diagnosis not present

## 2022-12-10 DIAGNOSIS — G40909 Epilepsy, unspecified, not intractable, without status epilepticus: Secondary | ICD-10-CM | POA: Diagnosis not present

## 2022-12-10 DIAGNOSIS — F603 Borderline personality disorder: Secondary | ICD-10-CM | POA: Diagnosis not present

## 2022-12-10 DIAGNOSIS — F17290 Nicotine dependence, other tobacco product, uncomplicated: Secondary | ICD-10-CM | POA: Diagnosis not present

## 2022-12-10 DIAGNOSIS — F411 Generalized anxiety disorder: Secondary | ICD-10-CM

## 2022-12-10 DIAGNOSIS — F329 Major depressive disorder, single episode, unspecified: Secondary | ICD-10-CM

## 2022-12-10 NOTE — Progress Notes (Signed)
Virtual Visit via Video Note   I connected with Alison Brock, who prefers to go by "Alison Brock" on 12/10/22 at  9:00 AM EDT by a video enabled telemedicine application and verified that I am speaking with the correct person using two identifiers.   At orientation to the IOP program, Case Manager discussed the limitations of evaluation and management by telemedicine and the availability of in person appointments. The patient expressed understanding and agreed to proceed with virtual visits throughout the duration of the program.   Location:  Patient: Patient Home Provider: [facility] [place] [facility]   History of Present Illness: MDD and GAD and Borderline Personality Disorder  Observations/Objective: Check In: Case Manager checked in with all participants to review discharge dates, insurance authorizations, work-related documents and needs from the treatment team regarding medications. Alison Brock stated needs and engaged in discussion.    Initial Therapeutic Activity: Counselor facilitated a check-in with Alison Brock to assess for safety, sobriety and medication compliance.  Counselor also inquired about Alison Brock's current emotional ratings, as well as any significant changes in thoughts, feelings or behavior since previous check in.  Alison Brock presented for session on time and was alert, oriented x5, with no evidence or self-report of active SI/HI or A/V H.  Alison Brock reported compliance with medication and denied use of alcohol or illicit substances.  Alison Brock reported scores of 0/10 for depression, 2/10 for anxiety, and 0/10 for anger/irritability.  Alison Brock denied any recent outbursts. Alison Brock reported that a recent struggle was having a 'little' panic attack last night when she wasn't feeling well and worried about throwing up.  Alison Brock reported that a success was getting this under control by practicing her grounding techniques.  Alison Brock reported that her goal today is to find ways to fill her schedule ahead of completing MHIOP tomorrow, since  she is anxious about having too much free time.       Second Therapeutic Activity: Counselor also acknowledged a graduating group member by prompting this member to reflect on progress made since beginning the [place] program, notable takeaways from sessions attended, challenges overcome, and plan for continued care following discharge. Counselor and group members shared observations of growth, words of encouragement and support as this member transitioned out of the program today.  Intervention was effective, as evidenced by engaging in discussion and expressing appreciation for the resilience the graduating member displayed during treatment, in addition to support offered.  Alison Brock reported that this person helped her feel more comfortable opening up, and participating in discussion since she was able to relate to her struggles.    Third Therapeutic Activity: Counselor introduced Oleta Mouse, [employer], to provide psychoeducation on topic of nutrition with members today.  Anda Kraft virtually shared a comprehensive PowerPoint presentation to guide discussion, which featured the various components of healthy living that influence one's wellbeing, including practicing mindfulness, staying physically active, calm in mood, well-rested, and more.  Anda Kraft explained how good nutrition reinforces positive physical and mental health, and shared a video which explained how food intake affects brain functioning in particular.  Anda Kraft provided advice on how to adjust diet in order to promote wellbeing during course of treatment, including achieving balanced daily intake along with regular exercise.  Anda Kraft offered the 'plate method' as a tool for proper distribution of protein, grains, starches, vegetables, fruit, and low calorie drink, in addition to concept of 'mindful eating', and covering current Dietary Guidelines for Americans for more tips.  Anda Kraft inquired about changes members would like to make to their nutrition in  order  to increase overall wellbeing based upon information shared today, and time was allowed to ask any questions they might have of Anda Kraft regarding her specialty.  Intervention was effective, as evidenced by [employer] participating in discussion with speaker on the subject, reporting that she is trying to improve her diet during treatment in order to increase energy and motivation each day.  Alison Brock also participated in stretching activity offered by speaker to aid physical self-care.       Assessment and Plan: Counselor recommends that Alison Brock remain in IOP treatment to better manage mental health symptoms, ensure stability and pursue completion of treatment plan goals. Counselor recommends adherence to crisis/safety plan, taking medications as prescribed, and following up with medical professionals if any issues arise.   Follow Up Instructions: Counselor will send Webex link for next session. Alison Brock was advised to call back or seek an in-person evaluation if the symptoms worsen or if the condition fails to improve as anticipated.   Collaboration of Care:   Medication Management AEB Dr. Fatima Sanger or Ricky Ala, NP                                          Case Manager AEB Dellia Nims, CNA   Patient/Guardian was advised Release of Information must be obtained prior to any record release in order to collaborate their care with an outside provider. Patient/Guardian was advised if they have not already done so to contact the registration department to sign all necessary forms in order for [place] to release information regarding their care.   Consent: Patient/Guardian gives verbal consent for treatment and assignment of benefits for services provided during this visit. Patient/Guardian expressed understanding and agreed to proceed.  I provided 180 minutes of non-face-to-face time during this encounter.   Shade Flood, LCSW, LCAS 12/10/22

## 2022-12-11 ENCOUNTER — Other Ambulatory Visit (HOSPITAL_COMMUNITY): Payer: 59 | Admitting: Psychiatry

## 2022-12-11 ENCOUNTER — Encounter (HOSPITAL_COMMUNITY): Payer: Self-pay | Admitting: Psychiatry

## 2022-12-11 DIAGNOSIS — G40909 Epilepsy, unspecified, not intractable, without status epilepticus: Secondary | ICD-10-CM | POA: Diagnosis not present

## 2022-12-11 DIAGNOSIS — F603 Borderline personality disorder: Secondary | ICD-10-CM | POA: Diagnosis not present

## 2022-12-11 DIAGNOSIS — F411 Generalized anxiety disorder: Secondary | ICD-10-CM

## 2022-12-11 DIAGNOSIS — F329 Major depressive disorder, single episode, unspecified: Secondary | ICD-10-CM

## 2022-12-11 DIAGNOSIS — F32A Depression, unspecified: Secondary | ICD-10-CM | POA: Diagnosis not present

## 2022-12-11 DIAGNOSIS — F17290 Nicotine dependence, other tobacco product, uncomplicated: Secondary | ICD-10-CM | POA: Diagnosis not present

## 2022-12-11 NOTE — Progress Notes (Signed)
Virtual Visit via Video Note   I connected with Alison Brock, who prefers to go by "Alison Brock" on 12/11/22 at  9:00 AM EDT by a video enabled telemedicine application and verified that I am speaking with the correct person using two identifiers.   At orientation to the IOP program, Case Manager discussed the limitations of evaluation and management by telemedicine and the availability of in person appointments. The patient expressed understanding and agreed to proceed with virtual visits throughout the duration of the program.   Location:  Patient: Patient Home Provider: [facility]   History of Present Illness: MDD and GAD and Borderline Personality Disorder  Observations/Objective: Check In: Case Manager checked in with all participants to review discharge dates, insurance authorizations, work-related documents and needs from the treatment team regarding medications. Alison Brock stated needs and engaged in discussion.    Initial Therapeutic Activity: Counselor facilitated a check-in with Alison Brock to assess for safety, sobriety and medication compliance.  Counselor also inquired about Alison Brock's current emotional ratings, as well as any significant changes in thoughts, feelings or behavior since previous check in.  Alison Brock presented for session on time and was alert, oriented x5, with no evidence or self-report of active SI/HI or A/V H.  Alison Brock reported compliance with medication and denied use of alcohol or illicit substances.  Alison Brock reported scores of 5/10 for depression, 2/10 for anxiety, and 7/10 for anger/irritability.  Alison Brock denied any recent outbursts or panic attacks.  Alison Brock reported that a recent success was going out to dinner at one of her favorite restaurants last night.  Alison Brock reported that a recent struggle was learning that her mother might have cancer, which is distressing for the family.  Alison Brock reported that her goal today is to visit her boyfriend and play with the cat.       Second  Therapeutic Activity: Counselor covered topic of core beliefs with group today.  Counselor virtually shared a handout on the subject, which explained how everyone looks at the world differently, and two people can have the same experience, but have different interpretations of what happened.  Members were encouraged to think of these like sunglasses with different "shades" influencing perception towards positive or negative outcomes.  Examples of negative core beliefs were provided, such as "I'm unlovable", "I'm not good enough", and "I'm a bad person".  Members were asked to share which one(s) they could relate to, and then identify evidence which contradicts these beliefs.  Counselor also provided psychoeducation on positive affirmations today.  Counselor explained how these are positive statements which can be spoken out loud or recited mentally to challenge negative thoughts and/or core beliefs to improve mood and outlook each day.  Counselor shared a comprehensive list of affirmations virtually to members with different categories, including ones for health, confidence, success, and happiness.  Counselor invited members to look through this list and identify any which resonated with them, and practice saying them out loud with sincerity.  Intervention was effective, as evidenced by Alison Brock successfully participating in discussion on the subject and reporting that she could relate to several negative core beliefs listed on the handout, such as "Nothing ever goes right", and "I will end up alone".  Alison Brock was able to successfully challenge the core belief "I will end up alone", reporting that she has currently been taking steps in order to curb isolation by engaging in support groups and setting aside quality time with her mother, friends, and boyfriend.  Alison Brock also reported that she liked  several of the positive affirmations listed, such as "I embrace my flaws because I know that nobody is perfect", and "Failure is  great feedback".    Assessment and Plan: Alison Brock has successfully completed MHIOP and will be discharged today.  Counselor recommends adherence to crisis/safety plan, taking medications as prescribed, and following up with medical professionals if any issues arise.   Follow Up Instructions: Alison Brock was advised to call back or seek an in-person evaluation if the symptoms worsen or if the condition fails to improve as anticipated.   Collaboration of Care:   Medication Management AEB Dr. Fatima Sanger or Ricky Ala, NP                                          Case Manager AEB Dellia Nims, CNA   Patient/Guardian was advised Release of Information must be obtained prior to any record release in order to collaborate their care with an outside provider. Patient/Guardian was advised if they have not already done so to contact the registration department to sign all necessary forms in order for [place] to release information regarding their care.   Consent: Patient/Guardian gives verbal consent for treatment and assignment of benefits for services provided during this visit. Patient/Guardian expressed understanding and agreed to proceed.  I provided 180 minutes of non-face-to-face time during this encounter.   Shade Flood, LCSW, LCAS 12/11/22

## 2022-12-11 NOTE — Patient Instructions (Signed)
D:  Patient completed MH-IOP today.  A:  D/C today.  F/U with Dr. Ander Purpura Blakenship and Johnna Acosta, LCSW (pt to call for appts).  Continue in DBT at [facility].  R:  Patient receptive.

## 2022-12-11 NOTE — Progress Notes (Signed)
[facility] Discharge Summary  Virtual Visit via Video Note  I connected with Alison Brock on 12/11/22 at  9:00 AM EST by a video enabled telemedicine application and verified that I am speaking with the correct person using two identifiers.  Location: Patient: Home Provider: [facility]   I discussed the limitations of evaluation and management by telemedicine and the availability of in person appointments. The patient expressed understanding and agreed to proceed.  Alison Brock [id number]  Admission date: 11/19/2022 Discharge date: 12/11/2022  Reason for admission:  She reports that she has been having significant issues with anxiety. She reports that her panic disorder has been becoming more severe. She reports that she has psychogenic seizures as well as epilepsy. She reports that she is being scheduled for a 2-hour EEG and potentially a longer timeframe 1 depending on those results and this is making her more anxious. She reports her anxiety is so severe she is isolating and will not leave the house without her mother or boyfriend. She reports that her outpatient provider has recently increased her BuSpar and is tapering her off of Prozac. She reports that in the past when her BuSpar has been increased there is a time period where she feels worse before feeling better and she feels like this is 1 of those times. She reports a history of medical related trauma given the hospitalizations and testing she has gone through.   Chemical Use History:  She reports no alcohol use.  She reports vaping daily- nicotine.  She reports no illicit substance use.   Family of Origin Issues:  Father- Anxiety, EtOH Abuse Maternal Aunt- multiple Suicide Attempts, Substance Abuse Paternal Grandfather- Depression  Progress in Program Toward Treatment Goals: Progressing  Progress (rationale):  Alison Brock is a 22 yr old female who presents via  Virtual Video Visit for Follow Up and Medication Management, she enrolled in the IOP Program on 11/19/2022.  PPHx is significant for Depression, GAD w/ Panic Disorder, Borderline Personality Disorder, Psychogenic Seizures, and Epilepsy, and no history of Suicide Attempts, Self Injurious Behavior, or Psychiatric Hospitalizations.   She reports that she is feeling a lot better since starting the IOP program.  She reports she has learned a lot of new coping skills and has really benefited from the feedback she has received from the group.  She reports she feels like the increase in her BuSpar has kicked in and is working well.  She reports no side effects to her medications.  She confirms she has follow-up scheduled with her outpatient provider and therapist.  She does have a question about whether to stay on Prozac.  She reports that her and her outpatient provider had originally planned for her to taper off and eventually stop it, however, she reports she has stayed on the 10 mg dose since she has been doing so well.  Discussed with her that since she is doing well and is stable and is not having any side effects to the Prozac that she should continue with it until she gets that follow-up appointment to further discuss with her outpatient provider.  She was agreeable with this and had no concerns and reports she does not need a refill.  She reports no SI, HI, or AVH.  She reports her sleep is good.  She reports her appetite is doing good.  She reports no other concerns at present.   Psychiatric Specialty Exam:   Review of Systems  Respiratory:  Negative for  shortness of breath.   Cardiovascular:  Negative for chest pain.  Gastrointestinal:  Negative for abdominal pain, constipation, diarrhea, nausea and vomiting.  Neurological:  Negative for dizziness, weakness and headaches.  Psychiatric/Behavioral:  Negative for dysphoric mood, hallucinations, sleep disturbance and suicidal ideas. The patient is not  nervous/anxious.     There were no vitals taken for this visit.There is no height or weight on file to calculate BMI.  General Appearance: Casual and Fairly Groomed  Eye Contact:  Good  Speech:  Clear and Coherent and Normal Rate  Volume:  Normal  Mood:   "ok"  Affect:  Appropriate and Congruent  Thought Process:  Coherent and Goal Directed  Orientation:  Full (Time, Place, and Person)  Thought Content:  WDL and Logical  Suicidal Thoughts:  No  Homicidal Thoughts:  No  Memory:  Immediate;   Good Recent;   Good  Judgement:  Good  Insight:  Good  Psychomotor Activity:  Normal  Concentration:  Concentration: Good and Attention Span: Good  Recall:  Good  Fund of Knowledge:  Good  Language:  Good  Akathisia:  Negative  Handed:  Right  AIMS (if indicated):     Assets:  Communication Skills Desire for Improvement Housing Resilience Social Support  ADL's:  Intact  Cognition:  WNL  Sleep:   good      Collaboration of Care: Other IOP Program  Patient/Guardian was advised Release of Information must be obtained prior to any record release in order to collaborate their care with an outside provider. Patient/Guardian was advised if they have not already done so to contact the registration department to sign all necessary forms in order for [place] to release information regarding their care.   Consent: Patient/Guardian gives verbal consent for treatment and assignment of benefits for services provided during this visit. Patient/Guardian expressed understanding and agreed to proceed.   CLARK, [place] 12/11/2022   Follow Up Instructions:    I discussed the assessment and treatment plan with the patient. The patient was provided an opportunity to ask questions and all were answered. The patient agreed with the plan and demonstrated an understanding of the instructions.   The patient was advised to call back or seek an in-person evaluation if the symptoms worsen or if the condition fails  to improve as anticipated.  I provided 15 minutes of non-face-to-face time during this encounter.   Briant Cedar, MD

## 2022-12-11 NOTE — Progress Notes (Signed)
Virtual Visit via Video Note  I connected with Alison Brock on @TODAY$ @ at  9:00 AM EST by a video enabled telemedicine application and verified that I am speaking with the correct person using two identifiers.  Location: Patient: at home Provider: at office   I discussed the limitations of evaluation and management by telemedicine and the availability of in person appointments. The patient expressed understanding and agreed to proceed.  I discussed the assessment and treatment plan with the patient. The patient was provided an opportunity to ask questions and all were answered. The patient agreed with the plan and demonstrated an understanding of the instructions.   The patient was advised to call back or seek an in-person evaluation if the symptoms worsen or if the condition fails to improve as anticipated.  I provided 30 minutes of non-face-to-face time during this encounter.   Dellia Nims, M.Ed,CNA   Patient ID: Alison Brock, female   DOB: July 02, 2001, 22 y.o.   MRN: [id number] Intake/Chief Complaint:  This is a 22 yr old, single, unemployed, [place] female who was referred per  Johnna Acosta, LCSW d/t worsening anxiety/depression for six months.  States the sx's started increasing the end of summer 2023; whenever she, boyfriend and friends went to the beach and she had a severe panic attack.  Pt c/o having a bad response to Klonopin her ex-psychiatrist had her on.  Pt left that psychiatrist and started at [facility] seeing Dr. Tomasa Blase who put pt back on Ativan.  Pt c/o of a bad response to the increase of Prozac; so pt is currently being weaned off Prozac.  The other stressor is medical. Pt states she's been having psychogenic seizures.  Mother has moved pt's bed downstairs.  Pt states she's having them regularly lately.  "One MD is saying it's because of a medication and the other is saying it's anxiety related."  Pt is awaiting an appt to have an EKG remotely at home.  Pt  has been seeing Johnna Acosta, LCSW @ [facility] for ~ a month and Pt was previously seeing Dr. Darleene Cleaver, until she started seeing Dr. Tomasa Blase at [facility] 3 months ago.  Pt denies any prior psychiatric hospitalizations or suicide attempts or gestures.  Family hx:  Dad (ETOH & THC); PGF (Depression); M-Aunt (drugs and depression).  Support system includes: Mother with whom she resides with and boyfriend of two yrs.   Pt has attended all scheduled days (#15).  Reports feeling over "much better."  "I am getting out more and my mom is happy that I am doing better.  I'm just worried about my mother."  Pt states it was discovered that her mother has uterine cancer.  She has to have a complete hysterectomy within a month.  "I am very grateful that they found it so she can have the surgery to have it removed.  I am trying to be positive."  Pt states she is at a better place b/c she can cope better.  Denies SI/HI or AV hallucinations.  On a scale of 1-10 (10 being the worst) pt rates her anxiety at a 3 and depression at a 0-1.  Reports that group was helpful.  "It turned out to be better than what I thought." A:  D/C today.  F/u with Dr. Ander Purpura Blakenship with [facility] and Georgiana Spinner, LCSW with [facility].  Pt will continue with DBT Group at [facility]. Pt was advised of ROI must be obtained prior to any records release in  order to collaborate her care with an outside provider.  Pt was advised if she has not already done so to contact the front desk to sign all necessary forms in order for [facility] to release info re: her care. Consent:  Pt gives verbal consent for tx and assignment of benefits for services provided during this telehealth group process.  Pt expressed understanding and agreed to proceed. Collaboration of care:  Collaborate with Dr. Fatima Sanger AEB, Shade Flood, LCSW AEB, Johnna Acosta, LCSW and Dr. Tomasa Blase.  R:  Pt receptive.  Dellia Nims,  M.Ed,CNA

## 2022-12-12 ENCOUNTER — Other Ambulatory Visit (HOSPITAL_COMMUNITY): Payer: 59

## 2022-12-15 ENCOUNTER — Other Ambulatory Visit (HOSPITAL_COMMUNITY): Payer: 59

## 2022-12-16 ENCOUNTER — Other Ambulatory Visit (HOSPITAL_COMMUNITY): Payer: 59

## 2022-12-16 DIAGNOSIS — F411 Generalized anxiety disorder: Secondary | ICD-10-CM | POA: Diagnosis not present

## 2022-12-16 DIAGNOSIS — F4001 Agoraphobia with panic disorder: Secondary | ICD-10-CM | POA: Diagnosis not present

## 2022-12-16 DIAGNOSIS — F331 Major depressive disorder, recurrent, moderate: Secondary | ICD-10-CM | POA: Diagnosis not present

## 2022-12-17 ENCOUNTER — Other Ambulatory Visit (HOSPITAL_COMMUNITY): Payer: 59

## 2022-12-18 ENCOUNTER — Other Ambulatory Visit (HOSPITAL_COMMUNITY): Payer: 59

## 2022-12-22 ENCOUNTER — Other Ambulatory Visit (INDEPENDENT_AMBULATORY_CARE_PROVIDER_SITE_OTHER): Payer: Self-pay

## 2022-12-22 DIAGNOSIS — R519 Headache, unspecified: Secondary | ICD-10-CM

## 2022-12-22 MED ORDER — AMITRIPTYLINE HCL 25 MG PO TABS: ORAL_TABLET | ORAL | 5 refills | Status: DC

## 2022-12-30 DIAGNOSIS — F411 Generalized anxiety disorder: Secondary | ICD-10-CM | POA: Diagnosis not present

## 2022-12-30 DIAGNOSIS — F331 Major depressive disorder, recurrent, moderate: Secondary | ICD-10-CM | POA: Diagnosis not present

## 2022-12-30 DIAGNOSIS — F4001 Agoraphobia with panic disorder: Secondary | ICD-10-CM | POA: Diagnosis not present

## 2022-12-30 DIAGNOSIS — F410 Panic disorder [episodic paroxysmal anxiety] without agoraphobia: Secondary | ICD-10-CM | POA: Diagnosis not present

## 2023-01-10 ENCOUNTER — Other Ambulatory Visit (INDEPENDENT_AMBULATORY_CARE_PROVIDER_SITE_OTHER): Payer: Self-pay | Admitting: Family

## 2023-01-13 ENCOUNTER — Encounter (INDEPENDENT_AMBULATORY_CARE_PROVIDER_SITE_OTHER): Payer: Self-pay

## 2023-01-20 ENCOUNTER — Telehealth (INDEPENDENT_AMBULATORY_CARE_PROVIDER_SITE_OTHER): Payer: 59 | Admitting: Family

## 2023-01-20 ENCOUNTER — Encounter (INDEPENDENT_AMBULATORY_CARE_PROVIDER_SITE_OTHER): Payer: Self-pay | Admitting: Family

## 2023-01-20 DIAGNOSIS — F410 Panic disorder [episodic paroxysmal anxiety] without agoraphobia: Secondary | ICD-10-CM

## 2023-01-20 DIAGNOSIS — G40B09 Juvenile myoclonic epilepsy, not intractable, without status epilepticus: Secondary | ICD-10-CM

## 2023-01-20 DIAGNOSIS — G43809 Other migraine, not intractable, without status migrainosus: Secondary | ICD-10-CM

## 2023-01-20 DIAGNOSIS — F419 Anxiety disorder, unspecified: Secondary | ICD-10-CM

## 2023-01-20 NOTE — Patient Instructions (Signed)
It was a pleasure to see you today!  Instructions for you until your next appointment are as follows: Continue medications as prescribed Continue working closely with your psychiatrist and therapist Contact your PCP about performing an examination as we discussed Please sign up for MyChart if you have not done so. Please plan to return for follow up in 3 months or sooner if needed.   Feel free to contact our office during normal business hours at [phone] with questions or concerns. If there is no answer or the call is outside business hours, please leave a message and our clinic staff will call you back within the next business day.  If you have an urgent concern, please stay on the line for our after-hours answering service and ask for the on-call neurologist.     I also encourage you to use MyChart to communicate with me more directly. If you have not yet signed up for MyChart within [facility], the front desk staff can help you. However, please note that this inbox is NOT monitored on nights or weekends, and response can take up to 2 business days.  Urgent matters should be discussed with the on-call pediatric neurologist.   At [facility], we are committed to providing exceptional care. You will receive a patient satisfaction survey through text or email regarding your visit today. Your opinion is important to me. Comments are appreciated.

## 2023-01-20 NOTE — Progress Notes (Signed)
This is a [facility] E-Visit consult/follow up provided via My Chart Video Visit (Alison Brock). Alison Brock and her mother Alison Brock consented to an E-Visit consult today.  Is the patient present for the video visit? Yes Location of patient: Alison Brock is at home Is the patient located in the state of [place]? Yes If not in the state of [place], is the location temporary? Ex. vacation or at college? Not Applicable Location of provider: Rockwell Germany, NP-C is at office Patient was referred by Nche, Charlene Brooke, NP   The following participants were involved in this E-Visit: CMA, NP, patient and her mother  This visit was done via VIDEO   Chief Complain/ Reason for E-Visit today: seizure follow up Total time on call: 15 min Follow up: 3  months   Alison Brock   MRN:  [id number]  [birth year]   Provider: Rockwell Germany NP-C Location of Care: [facility]  Visit type: Return video visit  Last visit: 10/07/2022  Referral source: Flossie Buffy, NP History from: Epic chart, patient and her mother  Brief history:  Copied from previous record: History of generalized seizure disorder based on clinical seizure activity and EEG, as well as headaches, anxiety and panic. She is taking and tolerating Levetiracetam XR for her seizure disorder and has remained seizure free since April 22, 2022. She is taking and tolerating Amitriptyline for migraine prevention. Alison Brock is being followed by a psychiatrist and a therapist  Today's concerns: Has remained seizure free since last visit Had 2 ED visits in January for panic attacks.  Went through intensive therapy for 3 weeks, then has had therapy and close follow up with a psychiatrist She has significant anxiety about leaving her home but with therapy is now able to leave her home and ride in the car. She will be working on getting out of the car and going into a business  as her next phase of therapy.  Alison Brock has had some unusual stressors in the last few weeks in that her mother was diagnosed with uterine cancer and underwent surgery 2 weeks ago.  Alison Brock has been too fearful to go to her PCP or to her OB/Gyn provider. She is now worried about not having a female exam and pap smear for some time since her mother's diagnosis of uterine cancer. She plans to contact her PCP to see if she can get this examination done at that office. She will also ask the PCP to reorder her birth control pills as I have been refilling that prescription. Has had some headaches but they have not been problematic. Continues to take Amitriptyline for migraine prevention. [name] has been otherwise generally healthy since she was last seen. No health concerns today other than previously mentioned.  Review of systems: Please see HPI for neurologic and other pertinent review of systems. Otherwise all other systems were reviewed and were negative.  Problem List: Patient Active Problem List   Diagnosis Date Noted   Left foot pain 03/07/2022   Encounter for long-term current use of medication 11/18/2020   Pyelonephritis 06/02/2019   Migraine variants, not intractable 02/09/2019   Panic disorder 12/25/2018   Social anxiety disorder 10/06/2018   Frequent headaches 07/08/2016   Anxiety 06/26/2016   Nonintractable juvenile myoclonic epilepsy without status epilepticus 06/26/2016   Seizure disorder 06/26/2016   AP (abdominal pain) 06/02/2016   Loss of weight 06/02/2016   Constipation 06/02/2016     Past Medical History:  Diagnosis Date   Anxiety    Seizures     Past medical history comments: See HPI  Surgical history: Past Surgical History:  Procedure Laterality Date   GUM SURGERY  03/2016     Family history: family history includes Alcohol abuse in her father; Anxiety disorder in her maternal aunt; Bipolar disorder in her maternal aunt; Depression in her paternal grandfather;  Drug abuse in her father; Hypertension in her maternal grandmother, mother, and paternal grandfather; Uterine cancer in her mother.   Social history: Social History   Socioeconomic History   Marital status: Single    Spouse name: Not on file   Number of children: Not on file   Years of education: Not on file   Highest education level: Not on file  Occupational History   Not on file  Tobacco Use   Smoking status: Every Day    Types: E-cigarettes    Start date: 08/20/2021    Passive exposure: Yes   Smokeless tobacco: Never  Vaping Use   Vaping Use: Every day   Start date: 10/21/2019   Substances: Nicotine   Devices: Vuse  Substance and Sexual Activity   Alcohol use: No   Drug use: No   Sexual activity: Never  Other Topics Concern   Not on file  Social History Narrative   Jerniyah is still in [employer]. She is not currently working.    Lives with her mother.   Social Determinants of Health   Financial Resource Strain: Unknown (06/02/2019)   Overall Financial Resource Strain (CARDIA)    Difficulty of Paying Living Expenses: Patient declined  Food Insecurity: Unknown (06/02/2019)   Hunger Vital Sign    Worried About Running Out of Food in the Last Year: Patient declined    [place] in the Last Year: Patient declined  Transportation Needs: Unknown (06/02/2019)   PRAPARE - Hydrologist (Medical): Patient declined    Lack of Transportation (Non-Medical): Patient declined  Physical Activity: Unknown (06/02/2019)   Exercise Vital Sign    Days of Exercise per Week: Patient declined    Minutes of Exercise per Session: Patient declined  Stress: Unknown (06/02/2019)   [place] of Stress : Patient declined  Social Connections: Unknown (06/02/2019)   Social Connection and Isolation Panel [NHANES]    Frequency of Communication with Friends and Family:  Patient declined    Frequency of Social Gatherings with Friends and Family: Patient declined    Attends Religious Services: Patient declined    Marine scientist or Organizations: Patient declined    Attends Archivist Meetings: Patient declined    Marital Status: Patient declined  Intimate Partner Violence: Unknown (06/02/2019)   Humiliation, Afraid, Rape, and Kick questionnaire    Fear of Current or Ex-Partner: Patient declined    Emotionally Abused: Patient declined    Physically Abused: Patient declined    Sexually Abused: Patient declined    Past/failed meds: Copied from previous record: Topiramate ER - worsened anxiety and panic  Allergies: Allergies  Allergen Reactions   Sulfamethoxazole-Trimethoprim Nausea And Vomiting   Bentyl [Dicyclomine Hcl]    Doxycycline Other (See Comments)   Grapefruit Extract Other (See Comments)    Pt is not supposed to eat grapefruit with medication    Other     Seasonal Allergies - Spring and Fall   Topiramate Er  Immunizations: Immunization History  Administered Date(s) Administered   PFIZER(Purple Top)SARS-COV-2 Vaccination 07/07/2020, 08/04/2020   Tdap 01/10/2013    Diagnostics/Screenings: Copied from previous record: 12/07/2022 - extended EEG - This routine EEG for 71 minutes during awake state is slightly abnormal due to occasional sporadic brief discharges in the form of spikes and slow wave activity, mostly in the frontal area but no other epileptiform discharges or seizure activity or any transient rhythmic activity noted. The findings are consistent with brief focal cortical irritability with slight increased epileptic potential.  Clinical correlation is indicated.   Teressa Lower, MD  04/24/2021 - rEEG - This EEG is unremarkable during awake state. There were just a couple of single sharps in the bilateral frontal area which is not significant.  This is significant improvement compared to her initial EEGs with  frequent generalized discharges. Please note that normal EEG does not exclude epilepsy, clinical correlation is indicated.  Teressa Lower, MD   04/29/18 - rEEG - This EEG is abnormal due to episodes of generalized discharges, more frontally predominant throughout the recording. The findings consistent with generalized seizure disorder, possibly juvenile myoclonic epilepsy, associated with lower seizure threshold and require careful clinical correlation    07/27/2016 - MRI Brain w/wo contrast - Artifact related to braces. Allowing for that, the examination is normal. No evidence of malformation. No evidence of acquired brain disease.  Physical Exam: There were no vitals taken for this visit.  General: Well developed, well nourished young woman, seated at her home, in no evident distress Head: Head normocephalic and atraumatic. Neck: Supple Musculoskeletal: No obvious deformities or scoliosis Skin: No rashes or neurocutaneous lesions  Neurologic Exam Mental Status: Awake and fully alert.  Oriented to place and time.  Recent and remote memory intact.  Attention span, concentration, and fund of knowledge appropriate.  Mood and affect appropriate. Motor: Normal functional bulk, tone and strength Sensory: Intact to touch and temperature in all extremities.  Coordination: Balance normal.  Gait and Station: Gait normal  Impression: Nonintractable juvenile myoclonic epilepsy without status epilepticus  Migraine variants, not intractable  Anxiety  Panic disorder   Recommendations for plan of care: The patient's previous Epic records were reviewed. No recent diagnostic studies to be reviewed with the patient.  Plan until next visit: Continue medications as prescribed  Continue working closely with psychiatrist and therapist Contact PCP about performing well woman exam as discussed Return in about 3 months (around 04/21/2023).  The medication list was reviewed and reconciled. No changes were  made in the prescribed medications today. A complete medication list was provided to the patient.  Allergies as of 01/20/2023       Reactions   Sulfamethoxazole-trimethoprim Nausea And Vomiting   Bentyl [dicyclomine Hcl]    Doxycycline Other (See Comments)   Grapefruit Extract Other (See Comments)   Pt is not supposed to eat grapefruit with medication    Other    Seasonal Allergies - Spring and Fall   Topiramate Er         Medication List        Accurate as of January 20, 2023  8:05 PM. If you have any questions, ask your nurse or doctor.          amitriptyline 25 MG tablet Commonly known as: ELAVIL TAKE TWO TABLETS BY MOUTH EVERY NIGHT AT BEDTIME   busPIRone 15 MG tablet Commonly known as: BUSPAR Take 20 mg by mouth 2 (two) times daily.   FLUoxetine 10 MG capsule Commonly known  as: PROZAC Take 10 mg by mouth daily.   levETIRAcetam 750 MG 24 hr tablet Commonly known as: KEPPRA XR TAKE TWO TABLETS BY MOUTH EVERY MORNING AND TAKE TWO TABLETS BY MOUTH EVERY NIGHT AT BEDTIME   LORazepam 0.5 MG tablet Commonly known as: ATIVAN Take 1/2 tablet 3 times per day for anxiety What changed: additional instructions   Low-Ogestrel 0.3-30 MG-MCG tablet Generic drug: norgestrel-ethinyl estradiol TAKE 1 TABLET BY MOUTH DAILY   Magnesium Oxide -Mg Supplement 500 MG Tabs Take 500 mg by mouth daily.   pyridOXINE 100 MG tablet Commonly known as: VITAMIN B6 Take 100 mg by mouth daily.   riboflavin 100 MG Tabs tablet Commonly known as: VITAMIN B-2 Take 100 mg by mouth daily.      Total time spent with the patient was 15 minutes, of which 50% or more was spent in counseling and coordination of care.  Rockwell Germany NP-C [facility] [id number], [place], [place] [address] Ph. [phone] Fax [phone]

## 2023-01-27 ENCOUNTER — Encounter (INDEPENDENT_AMBULATORY_CARE_PROVIDER_SITE_OTHER): Payer: Self-pay

## 2023-01-27 DIAGNOSIS — F331 Major depressive disorder, recurrent, moderate: Secondary | ICD-10-CM | POA: Diagnosis not present

## 2023-01-27 DIAGNOSIS — F4001 Agoraphobia with panic disorder: Secondary | ICD-10-CM | POA: Diagnosis not present

## 2023-01-27 DIAGNOSIS — F410 Panic disorder [episodic paroxysmal anxiety] without agoraphobia: Secondary | ICD-10-CM | POA: Diagnosis not present

## 2023-02-13 DIAGNOSIS — F411 Generalized anxiety disorder: Secondary | ICD-10-CM | POA: Diagnosis not present

## 2023-02-13 DIAGNOSIS — F4001 Agoraphobia with panic disorder: Secondary | ICD-10-CM | POA: Diagnosis not present

## 2023-02-13 DIAGNOSIS — F331 Major depressive disorder, recurrent, moderate: Secondary | ICD-10-CM | POA: Diagnosis not present

## 2023-03-04 ENCOUNTER — Telehealth (INDEPENDENT_AMBULATORY_CARE_PROVIDER_SITE_OTHER): Payer: Self-pay | Admitting: Family

## 2023-03-04 NOTE — Telephone Encounter (Signed)
  Name of who is calling: Claris Che [name])   Caller's Relationship to Patient:   Best contact number: [phone]  Provider they see: [facility]  Reason for call: Disability determination services is sending over a fax requesting records for [facility].     PRESCRIPTION REFILL ONLY  Name of prescription:  Pharmacy:

## 2023-03-10 NOTE — Telephone Encounter (Signed)
Records released on 03/05/23.

## 2023-03-11 ENCOUNTER — Encounter (INDEPENDENT_AMBULATORY_CARE_PROVIDER_SITE_OTHER): Payer: Self-pay

## 2023-03-12 ENCOUNTER — Telehealth (INDEPENDENT_AMBULATORY_CARE_PROVIDER_SITE_OTHER): Payer: 59 | Admitting: Nurse Practitioner

## 2023-03-12 ENCOUNTER — Encounter: Payer: Self-pay | Admitting: Nurse Practitioner

## 2023-03-12 VITALS — Temp 98.0°F | Ht 63.0 in | Wt 213.0 lb

## 2023-03-12 DIAGNOSIS — Z3041 Encounter for surveillance of contraceptive pills: Secondary | ICD-10-CM | POA: Diagnosis not present

## 2023-03-12 DIAGNOSIS — A6920 Lyme disease, unspecified: Secondary | ICD-10-CM | POA: Diagnosis not present

## 2023-03-12 MED ORDER — LOW-OGESTREL 0.3-30 MG-MCG PO TABS: 1.0000 | ORAL_TABLET | Freq: Every day | ORAL | 3 refills | Status: DC

## 2023-03-12 MED ORDER — AMOXICILLIN 500 MG PO TABS: 500.0000 mg | ORAL_TABLET | Freq: Three times a day (TID) | ORAL | 0 refills | Status: DC

## 2023-03-12 NOTE — Progress Notes (Signed)
Virtual Visit via Video Note  I connected withNAME@ on 03/12/23 at  1:20 PM EDT by a video enabled telemedicine application and verified that I am speaking with the correct person using two identifiers.  Location: Patient:Home Provider: Office Participants: patient, boyfriend, mother, and provider  I discussed the limitations of evaluation and management by telemedicine and the availability of in person appointments. I also discussed with the patient that there may be a patient responsible charge related to this service. The patient expressed understanding and agreed to proceed.  WU:JWJX bite and refill of COC  History of Present Illness:  Rash This is a new problem. The current episode started in the past 7 days. The problem has been gradually worsening since onset. The affected locations include the left shoulder. The rash is characterized by itchiness, redness and swelling. She was exposed to an insect bite/sting (Tick bite). Pertinent negatives include no anorexia, cough, diarrhea, fatigue, fever, joint pain, rhinorrhea, shortness of breath, sore throat or vomiting. Past treatments include antibiotic cream. The treatment provided no relief. There is no history of allergies, asthma, eczema or varicella.  She thinks tick was attached for up to 48hrs. Identifies tick as brown, no white spot. Unable to tolerate doxycyline in past (hallucination and nausea)  She is also requesting COC refill. Previously prescribed by neurology. Does have not been interrupted. She is sexually active. She is due for 1st PAP. Appt scheduled   Observations/Objective: Physical Exam Nursing note reviewed.  Constitutional:      General: She is not in acute distress. Pulmonary:     Effort: Pulmonary effort is normal.  Skin:    Findings: Erythema and rash present.          Comments: Erythematous circular rash.  Neurological:     Mental Status: She is alert and oriented to person, place, and time.      Assessment and Plan: Perlie was seen today for insect bite.  Diagnoses and all orders for this visit:  Encounter for surveillance of contraceptive pills -     LOW-OGESTREL 0.3-30 MG-MCG tablet; Take 1 tablet by mouth daily.  Erythema migrans (Lyme disease) -     amoxicillin (AMOXIL) 500 MG tablet; Take 1 tablet (500 mg total) by mouth 3 (three) times daily.   Follow Up Instructions: See instructions above   I discussed the assessment and treatment plan with the patient. The patient was provided an opportunity to ask questions and all were answered. The patient agreed with the plan and demonstrated an understanding of the instructions.   The patient was advised to call back or seek an in-person evaluation if the symptoms worsen or if the condition fails to improve as anticipated.  Alysia Penna, NP

## 2023-03-12 NOTE — Telephone Encounter (Signed)
I called and spoke with Alison Brock. She has a virtual visit with her PCP later today and I encouraged her to keep that appointment. TG

## 2023-03-19 ENCOUNTER — Encounter: Payer: Self-pay | Admitting: Nurse Practitioner

## 2023-03-20 DIAGNOSIS — F4001 Agoraphobia with panic disorder: Secondary | ICD-10-CM | POA: Diagnosis not present

## 2023-03-20 DIAGNOSIS — F331 Major depressive disorder, recurrent, moderate: Secondary | ICD-10-CM | POA: Diagnosis not present

## 2023-03-20 DIAGNOSIS — F411 Generalized anxiety disorder: Secondary | ICD-10-CM | POA: Diagnosis not present

## 2023-03-24 ENCOUNTER — Encounter: Payer: Self-pay | Admitting: Nurse Practitioner

## 2023-04-03 ENCOUNTER — Other Ambulatory Visit (INDEPENDENT_AMBULATORY_CARE_PROVIDER_SITE_OTHER): Payer: Self-pay | Admitting: Family

## 2023-04-03 DIAGNOSIS — G40B09 Juvenile myoclonic epilepsy, not intractable, without status epilepticus: Secondary | ICD-10-CM

## 2023-04-03 DIAGNOSIS — F411 Generalized anxiety disorder: Secondary | ICD-10-CM | POA: Diagnosis not present

## 2023-04-03 DIAGNOSIS — F410 Panic disorder [episodic paroxysmal anxiety] without agoraphobia: Secondary | ICD-10-CM | POA: Diagnosis not present

## 2023-04-03 DIAGNOSIS — F4001 Agoraphobia with panic disorder: Secondary | ICD-10-CM | POA: Diagnosis not present

## 2023-04-03 DIAGNOSIS — F331 Major depressive disorder, recurrent, moderate: Secondary | ICD-10-CM | POA: Diagnosis not present

## 2023-04-10 DIAGNOSIS — F4001 Agoraphobia with panic disorder: Secondary | ICD-10-CM | POA: Diagnosis not present

## 2023-04-10 DIAGNOSIS — F331 Major depressive disorder, recurrent, moderate: Secondary | ICD-10-CM | POA: Diagnosis not present

## 2023-04-10 DIAGNOSIS — F410 Panic disorder [episodic paroxysmal anxiety] without agoraphobia: Secondary | ICD-10-CM | POA: Diagnosis not present

## 2023-04-10 DIAGNOSIS — F411 Generalized anxiety disorder: Secondary | ICD-10-CM | POA: Diagnosis not present

## 2023-04-27 ENCOUNTER — Other Ambulatory Visit (INDEPENDENT_AMBULATORY_CARE_PROVIDER_SITE_OTHER): Payer: Self-pay

## 2023-04-27 DIAGNOSIS — F331 Major depressive disorder, recurrent, moderate: Secondary | ICD-10-CM | POA: Diagnosis not present

## 2023-04-27 DIAGNOSIS — F411 Generalized anxiety disorder: Secondary | ICD-10-CM | POA: Diagnosis not present

## 2023-04-27 DIAGNOSIS — F4001 Agoraphobia with panic disorder: Secondary | ICD-10-CM | POA: Diagnosis not present

## 2023-04-27 DIAGNOSIS — G40B09 Juvenile myoclonic epilepsy, not intractable, without status epilepticus: Secondary | ICD-10-CM

## 2023-04-27 MED ORDER — LEVETIRACETAM ER 750 MG PO TB24
ORAL_TABLET | ORAL | 0 refills | Status: DC
Start: 2023-04-27 — End: 2023-05-25

## 2023-04-27 NOTE — Telephone Encounter (Signed)
Received fax from pharmacy requesting RF for Keppra.   Contacted patient to schedule follow up appointment.   Appointment scheduled for 8.8.2024 - Virtual - at 3:00 PM.   Enough refills sent to last until next appointment.   SS, CCMA

## 2023-05-11 ENCOUNTER — Telehealth: Payer: Self-pay | Admitting: Nurse Practitioner

## 2023-05-11 ENCOUNTER — Encounter: Payer: 59 | Admitting: Nurse Practitioner

## 2023-05-11 NOTE — Telephone Encounter (Signed)
NS 7/22 no reason available. Letter sent

## 2023-05-13 NOTE — Telephone Encounter (Signed)
1st no show, fee waived, letter sent to reschedule/notify of policy 

## 2023-05-25 ENCOUNTER — Other Ambulatory Visit (INDEPENDENT_AMBULATORY_CARE_PROVIDER_SITE_OTHER): Payer: Self-pay | Admitting: Family

## 2023-05-25 DIAGNOSIS — G40B09 Juvenile myoclonic epilepsy, not intractable, without status epilepticus: Secondary | ICD-10-CM

## 2023-05-25 NOTE — Telephone Encounter (Signed)
Last OV 10/07/22 Next OV 8/8  Per Inetta Fermo rf for 30 days

## 2023-05-28 ENCOUNTER — Encounter (INDEPENDENT_AMBULATORY_CARE_PROVIDER_SITE_OTHER): Payer: Self-pay | Admitting: Family

## 2023-05-28 ENCOUNTER — Telehealth (INDEPENDENT_AMBULATORY_CARE_PROVIDER_SITE_OTHER): Payer: 59 | Admitting: Family

## 2023-05-28 DIAGNOSIS — G43809 Other migraine, not intractable, without status migrainosus: Secondary | ICD-10-CM | POA: Diagnosis not present

## 2023-05-28 DIAGNOSIS — R519 Headache, unspecified: Secondary | ICD-10-CM

## 2023-05-28 DIAGNOSIS — F410 Panic disorder [episodic paroxysmal anxiety] without agoraphobia: Secondary | ICD-10-CM

## 2023-05-28 DIAGNOSIS — G40B09 Juvenile myoclonic epilepsy, not intractable, without status epilepticus: Secondary | ICD-10-CM | POA: Diagnosis not present

## 2023-05-28 DIAGNOSIS — F419 Anxiety disorder, unspecified: Secondary | ICD-10-CM

## 2023-05-28 MED ORDER — LEVETIRACETAM ER 750 MG PO TB24
ORAL_TABLET | ORAL | 5 refills | Status: DC
Start: 2023-05-28 — End: 2023-12-19

## 2023-05-28 MED ORDER — AMITRIPTYLINE HCL 25 MG PO TABS
ORAL_TABLET | ORAL | 5 refills | Status: DC
Start: 2023-05-28 — End: 2023-12-14

## 2023-05-28 NOTE — Patient Instructions (Addendum)
It was a pleasure to see you today!  Instructions for you until your next appointment are as follows: Continue your medications as prescribed. I sent in refill sfor the Levetiracetam and the Amitriptyline Talk with your psychiatrist about other options as you feel "stuck" in your progress right now Be sure to follow up with your PCP Please sign up for MyChart if you have not done so. Please plan to return for follow up in 3 months or sooner if needed.  Feel free to contact our office during normal business hours at [phone] with questions or concerns. If there is no answer or the call is outside business hours, please leave a message and our clinic staff will call you back within the next business day.  If you have an urgent concern, please stay on the line for our after-hours answering service and ask for the on-call neurologist.     I also encourage you to use MyChart to communicate with me more directly. If you have not yet signed up for MyChart within [facility], the front desk staff can help you. However, please note that this inbox is NOT monitored on nights or weekends, and response can take up to 2 business days.  Urgent matters should be discussed with the on-call pediatric neurologist.   At [facility], we are committed to providing exceptional care. You will receive a patient satisfaction survey through text or email regarding your visit today. Your opinion is important to me. Comments are appreciated.

## 2023-05-28 NOTE — Progress Notes (Signed)
This is a [facility] E-Visit consult/follow up provided via My Chart Video Visit (Caregility). Female Alison Brock and her mother Alison Brock consented to an E-Visit consult today.  Is the patient present for the video visit? Yes Location of patient: Alison Brock is at home. Is the patient located in the state of [place]? Yes Location of provider: Elveria Rising, NP-C is at home. Patient was referred by Nche, Bonna Gains, NP   The following participants were involved in this E-Visit: CMA, NP, patient and her mother  This visit was done via VIDEO   Chief Complain/ Reason for E-Visit today: seizure follow up Total time on call: 25 min Follow up: 3 months   Alison Brock   MRN:  [id number]  [birth year]   Provider: Elveria Rising NP-C Location of Care: [facility]  Visit type: Return visit  Last visit: 01/20/2023  Referral source: Anne Ng, NP History from: Epic chart, patient and her mother  Brief history:  Copied from previous record: History of generalized seizure disorder based on clinical seizure activity and EEG, as well as headaches, anxiety and panic. She is taking and tolerating Levetiracetam XR for her seizure disorder and has remained seizure free since April 22, 2022. She is taking and tolerating Amitriptyline for migraine prevention. Alison Brock is being followed by a psychiatrist and a therapist   Today's concerns: Alison Brock reports that she has remained seizure free since her last visit. She reported one psychogenic seizure last weekend and says that she knows the difference in the way she feel with them versus the epileptic seizures She reports that she continues to struggle with anxiety and panic. She is being treated by a psychiatrist and sees a therapist 3 times per week. Mom wonders if Alison Brock should see a psychologist as well. Alison Brock has an appointment with her psychiatrist tomorrow and plans to ask at that  time. Alison Brock admits that she has made some progress as she has been able to go outside her home on drives with her boyfriend and recently went into a convenience store to see if she could tolerate it.  She reports intermittent problems sleeping Alison Brock has not yet been to see her gynecologist but says that her PCP is prescribing birth control for her.  Alison Brock had an infection related to a tick bite a few months ago but has been otherwise generally healthy since she was last seen. No health concerns today other than previously mentioned.  Review of systems: Please see HPI for neurologic and other pertinent review of systems. Otherwise all other systems were reviewed and were negative.  Problem List: Patient Active Problem List   Diagnosis Date Noted   Left foot pain 03/07/2022   Encounter for long-term current use of medication 11/18/2020   Pyelonephritis 06/02/2019   Migraine variants, not intractable 02/09/2019   Panic disorder 12/25/2018   Social anxiety disorder 10/06/2018   Frequent headaches 07/08/2016   Anxiety 06/26/2016   Nonintractable juvenile myoclonic epilepsy without status epilepticus (HCC) 06/26/2016   Seizure disorder (HCC) 06/26/2016   AP (abdominal pain) 06/02/2016   Loss of weight 06/02/2016   Constipation 06/02/2016     Past Medical History:  Diagnosis Date   Anxiety    Seizures (HCC)     Past medical history comments: See HPI  Surgical history: Past Surgical History:  Procedure Laterality Date   GUM SURGERY  03/2016    Family history: family history includes Alcohol abuse in her father; Anxiety disorder  in her maternal aunt; Bipolar disorder in her maternal aunt; Depression in her paternal grandfather; Drug abuse in her father; Hypertension in her maternal grandmother, mother, and paternal grandfather; Uterine cancer in her mother.   Social history: Social History   Socioeconomic History   Marital status: Single    Spouse name: Not on file   Number of  children: Not on file   Years of education: Not on file   Highest education level: Not on file  Occupational History   Not on file  Tobacco Use   Smoking status: Every Day    Types: E-cigarettes    Start date: 08/20/2021    Passive exposure: Yes   Smokeless tobacco: Never  Vaping Use   Vaping status: Every Day   Start date: 10/21/2019   Substances: Nicotine   Devices: Vuse  Substance and Sexual Activity   Alcohol use: No   Drug use: No   Sexual activity: Never  Other Topics Concern   Not on file  Social History Narrative   [name] is still in [employer]. She is not currently working.    Lives with her mother.   Social Determinants of Health   Financial Resource Strain: Unknown (06/02/2019)   Overall Financial Resource Strain (CARDIA)    Difficulty of Paying Living Expenses: Patient declined  Food Insecurity: Unknown (06/02/2019)   Hunger Vital Sign    Worried About Running Out of Food in the Last Year: Patient declined    Ran Out of Food in the Last Year: Patient declined  Transportation Needs: Unknown (06/02/2019)   PRAPARE - Administrator, Civil Service (Medical): Patient declined    Lack of Transportation (Non-Medical): Patient declined  Physical Activity: Unknown (06/02/2019)   Exercise Vital Sign    Days of Exercise per Week: Patient declined    Minutes of Exercise per Session: Patient declined  Stress: Unknown (06/02/2019)   [employer] of Occupational Health - Occupational Stress Questionnaire    Feeling of Stress : Patient declined  Social Connections: Unknown (06/02/2019)   Social Connection and Isolation Panel [NHANES]    Frequency of Communication with Friends and Family: Patient declined    Frequency of Social Gatherings with Friends and Family: Patient declined    Attends Religious Services: Patient declined    Database administrator or Organizations: Patient declined    Attends Banker Meetings: Patient  declined    Marital Status: Patient declined  Intimate Partner Violence: Unknown (06/02/2019)   Humiliation, Afraid, Rape, and Kick questionnaire    Fear of Current or Ex-Partner: Patient declined    Emotionally Abused: Patient declined    Physically Abused: Patient declined    Sexually Abused: Patient declined    Past/failed meds: Copied from previous record: Topiramate ER - worsened anxiety and panic  Klonopin - ineffective Fluoxetine - ineffective  Allergies: Allergies  Allergen Reactions   Sulfamethoxazole-Trimethoprim Nausea And Vomiting   Bentyl [Dicyclomine Hcl]    Doxycycline Other (See Comments)    Nausea and hallucination   Grapefruit Extract Other (See Comments)    Pt is not supposed to eat grapefruit with medication    Other     Seasonal Allergies - Spring and Fall   Topiramate Er     Immunizations: Immunization History  Administered Date(s) Administered   PFIZER(Purple Top)SARS-COV-2 Vaccination 07/07/2020, 08/04/2020   Tdap 01/10/2013    Diagnostics/Screenings: Copied from previous record: 12/07/2022 - extended EEG - This routine EEG for 71 minutes  during awake state is slightly abnormal due to occasional sporadic brief discharges in the form of spikes and slow wave activity, mostly in the frontal area but no other epileptiform discharges or seizure activity or any transient rhythmic activity noted. The findings are consistent with brief focal cortical irritability with slight increased epileptic potential.  Clinical correlation is indicated.   Keturah Shavers, MD   04/24/2021 - rEEG - This EEG is unremarkable during awake state. There were just a couple of single sharps in the bilateral frontal area which is not significant.  This is significant improvement compared to her initial EEGs with frequent generalized discharges. Please note that normal EEG does not exclude epilepsy, clinical correlation is indicated.  Keturah Shavers, MD   04/29/18 - rEEG - This EEG is  abnormal due to episodes of generalized discharges, more frontally predominant throughout the recording. The findings consistent with generalized seizure disorder, possibly juvenile myoclonic epilepsy, associated with lower seizure threshold and require careful clinical correlation    07/27/2016 - MRI Brain w/wo contrast - Artifact related to braces. Allowing for that, the examination is normal. No evidence of malformation. No evidence of acquired brain disease.  Physical Exam: There were no vitals taken for this visit.  General: well developed, well nourished woman, seated at home, in no evident distress Head: normocephalic and atraumatic. No dysmorphic features. Neck: supple Musculoskeletal: No skeletal deformities or obvious scoliosis Skin: no rashes or neurocutaneous lesions  Neurologic Exam Mental Status: Awake and fully alert.  Attention span, concentration, and fund of knowledge appropriate for age.  Speech fluent without dysarthria.  Able to follow commands and participate in examination. Cranial Nerves: Turns to localize faces, objects and sounds in the periphery. Facial sensation intact.  Face, tongue, palate move normally and symmetrically. Motor: Normal functional bulk, tone and strength Sensory: Intact to touch and temperature in all extremities. Coordination: Finger-to-nose and heel-to-shin intact bilaterally. Balance adequate Gait and Station: Arises from chair, without difficulty. Stance is normal.  Gait demonstrates normal stride length and balance.   Impression: Nonintractable juvenile myoclonic epilepsy without status epilepticus (HCC) - Plan: levETIRAcetam (KEPPRA XR) 750 MG 24 hr tablet  Anxiety  Panic disorder  Migraine variants, not intractable  Frequent headaches - Plan: amitriptyline (ELAVIL) 25 MG tablet   Recommendations for plan of care: The patient's previous Epic records were reviewed. No recent diagnostic studies to be reviewed with the patient.  Plan  until next visit: Continue medications as prescribed. Refills were sent in today Talk with psychiatrist about whether or not a psychologist would be beneficial for you. Follow up with PCP.  Call if seizures occur or for other questions or concerns Return in about 3 months (around 08/28/2023).  The medication list was reviewed and reconciled. No changes were made in the prescribed medications today. A complete medication list was provided to the patient.  Allergies as of 05/28/2023       Reactions   Sulfamethoxazole-trimethoprim Nausea And Vomiting   Bentyl [dicyclomine Hcl]    Doxycycline Other (See Comments)   Nausea and hallucination   Grapefruit Extract Other (See Comments)   Pt is not supposed to eat grapefruit with medication    Other    Seasonal Allergies - Spring and Fall   Topiramate Er         Medication List        Accurate as of May 28, 2023  6:56 PM. If you have any questions, ask your nurse or doctor.  STOP taking these medications    amoxicillin 500 MG tablet Commonly known as: AMOXIL Stopped by: Elveria Rising   FLUoxetine 10 MG capsule Commonly known as: PROZAC Stopped by: Elveria Rising       TAKE these medications    amitriptyline 25 MG tablet Commonly known as: ELAVIL TAKE TWO TABLETS BY MOUTH EVERY NIGHT AT BEDTIME   busPIRone 15 MG tablet Commonly known as: BUSPAR Take 20 mg by mouth 2 (two) times daily.   levETIRAcetam 750 MG 24 hr tablet Commonly known as: KEPPRA XR TAKE 2 TABLETS BY MOUTH EVERY MORNING AND TAKE TWO TABLETS BY MOUTH EVERY NIGHT AT BEDTIME   LORazepam 0.5 MG tablet Commonly known as: ATIVAN Take 1/2 tablet 3 times per day for anxiety What changed: additional instructions   Low-Ogestrel 0.3-30 MG-MCG tablet Generic drug: norgestrel-ethinyl estradiol Take 1 tablet by mouth daily.   Magnesium Oxide -Mg Supplement 500 MG Tabs Take 500 mg by mouth daily.   pyridOXINE 100 MG tablet Commonly known  as: VITAMIN B6 Take 100 mg by mouth daily.   riboflavin 100 MG Tabs tablet Commonly known as: VITAMIN B-2 Take 100 mg by mouth daily.      Total time spent with the patient was 25 minutes, of which 50% or more was spent in counseling and coordination of care.  Elveria Rising NP-C [facility] [address] [place], [place] [address] Ph. [phone] Fax [phone]

## 2023-05-29 DIAGNOSIS — F4001 Agoraphobia with panic disorder: Secondary | ICD-10-CM | POA: Diagnosis not present

## 2023-05-29 DIAGNOSIS — F331 Major depressive disorder, recurrent, moderate: Secondary | ICD-10-CM | POA: Diagnosis not present

## 2023-05-29 DIAGNOSIS — F411 Generalized anxiety disorder: Secondary | ICD-10-CM | POA: Diagnosis not present

## 2023-06-26 DIAGNOSIS — F410 Panic disorder [episodic paroxysmal anxiety] without agoraphobia: Secondary | ICD-10-CM | POA: Diagnosis not present

## 2023-06-26 DIAGNOSIS — F4001 Agoraphobia with panic disorder: Secondary | ICD-10-CM | POA: Diagnosis not present

## 2023-06-26 DIAGNOSIS — F331 Major depressive disorder, recurrent, moderate: Secondary | ICD-10-CM | POA: Diagnosis not present

## 2023-07-09 DIAGNOSIS — F4001 Agoraphobia with panic disorder: Secondary | ICD-10-CM | POA: Diagnosis not present

## 2023-07-09 DIAGNOSIS — F331 Major depressive disorder, recurrent, moderate: Secondary | ICD-10-CM | POA: Diagnosis not present

## 2023-07-27 DIAGNOSIS — F4001 Agoraphobia with panic disorder: Secondary | ICD-10-CM | POA: Diagnosis not present

## 2023-07-27 DIAGNOSIS — F331 Major depressive disorder, recurrent, moderate: Secondary | ICD-10-CM | POA: Diagnosis not present

## 2023-07-28 DIAGNOSIS — F411 Generalized anxiety disorder: Secondary | ICD-10-CM | POA: Diagnosis not present

## 2023-07-28 DIAGNOSIS — F4001 Agoraphobia with panic disorder: Secondary | ICD-10-CM | POA: Diagnosis not present

## 2023-07-28 DIAGNOSIS — F331 Major depressive disorder, recurrent, moderate: Secondary | ICD-10-CM | POA: Diagnosis not present

## 2023-07-31 DIAGNOSIS — F411 Generalized anxiety disorder: Secondary | ICD-10-CM | POA: Diagnosis not present

## 2023-07-31 DIAGNOSIS — F4001 Agoraphobia with panic disorder: Secondary | ICD-10-CM | POA: Diagnosis not present

## 2023-07-31 DIAGNOSIS — F331 Major depressive disorder, recurrent, moderate: Secondary | ICD-10-CM | POA: Diagnosis not present

## 2023-08-04 DIAGNOSIS — F331 Major depressive disorder, recurrent, moderate: Secondary | ICD-10-CM | POA: Diagnosis not present

## 2023-08-04 DIAGNOSIS — F4001 Agoraphobia with panic disorder: Secondary | ICD-10-CM | POA: Diagnosis not present

## 2023-08-04 DIAGNOSIS — F411 Generalized anxiety disorder: Secondary | ICD-10-CM | POA: Diagnosis not present

## 2023-08-06 ENCOUNTER — Telehealth (INDEPENDENT_AMBULATORY_CARE_PROVIDER_SITE_OTHER): Payer: Self-pay | Admitting: Family

## 2023-08-06 NOTE — Telephone Encounter (Signed)
  Name of who is calling: [name]   Caller's Relationship to Patient:  Best contact number: [phone]  Provider they see: Inetta Fermo   Reason for call: calling to see if she can have enough keppra and amitiptyline medication to last until next appt in January      PRESCRIPTION REFILL ONLY  Name of prescription: Keppra and amitriptyline  Pharmacy: [facility] [phone] [address]

## 2023-08-06 NOTE — Telephone Encounter (Signed)
Last OV 05/28/2023 Next OV 11/04/2023 Rx written 05/28/2023     Keppra 120 (1 month) with 5 refills    Amitriptyline 60 tab (1 month) 5 rf  Call to [facility] advised she should still have enough refills to last until Jan. Call the pharm and tell them the Rx's were sent 8/8 with 5 refills She will call them and let us know if there is a problem

## 2023-08-07 DIAGNOSIS — F4001 Agoraphobia with panic disorder: Secondary | ICD-10-CM | POA: Diagnosis not present

## 2023-08-07 DIAGNOSIS — F331 Major depressive disorder, recurrent, moderate: Secondary | ICD-10-CM | POA: Diagnosis not present

## 2023-08-07 DIAGNOSIS — F411 Generalized anxiety disorder: Secondary | ICD-10-CM | POA: Diagnosis not present

## 2023-08-08 DIAGNOSIS — F321 Major depressive disorder, single episode, moderate: Secondary | ICD-10-CM | POA: Diagnosis not present

## 2023-08-08 DIAGNOSIS — Z6841 Body Mass Index (BMI) 40.0 and over, adult: Secondary | ICD-10-CM | POA: Diagnosis not present

## 2023-08-08 DIAGNOSIS — Z888 Allergy status to other drugs, medicaments and biological substances status: Secondary | ICD-10-CM | POA: Diagnosis not present

## 2023-08-08 DIAGNOSIS — Z809 Family history of malignant neoplasm, unspecified: Secondary | ICD-10-CM | POA: Diagnosis not present

## 2023-08-08 DIAGNOSIS — Z818 Family history of other mental and behavioral disorders: Secondary | ICD-10-CM | POA: Diagnosis not present

## 2023-08-08 DIAGNOSIS — F411 Generalized anxiety disorder: Secondary | ICD-10-CM | POA: Diagnosis not present

## 2023-08-08 DIAGNOSIS — F4001 Agoraphobia with panic disorder: Secondary | ICD-10-CM | POA: Diagnosis not present

## 2023-08-08 DIAGNOSIS — I10 Essential (primary) hypertension: Secondary | ICD-10-CM | POA: Diagnosis not present

## 2023-08-08 DIAGNOSIS — F17290 Nicotine dependence, other tobacco product, uncomplicated: Secondary | ICD-10-CM | POA: Diagnosis not present

## 2023-08-08 DIAGNOSIS — Z793 Long term (current) use of hormonal contraceptives: Secondary | ICD-10-CM | POA: Diagnosis not present

## 2023-08-08 DIAGNOSIS — G40909 Epilepsy, unspecified, not intractable, without status epilepticus: Secondary | ICD-10-CM | POA: Diagnosis not present

## 2023-08-11 DIAGNOSIS — F4001 Agoraphobia with panic disorder: Secondary | ICD-10-CM | POA: Diagnosis not present

## 2023-08-11 DIAGNOSIS — F331 Major depressive disorder, recurrent, moderate: Secondary | ICD-10-CM | POA: Diagnosis not present

## 2023-08-11 DIAGNOSIS — F411 Generalized anxiety disorder: Secondary | ICD-10-CM | POA: Diagnosis not present

## 2023-08-14 DIAGNOSIS — F4001 Agoraphobia with panic disorder: Secondary | ICD-10-CM | POA: Diagnosis not present

## 2023-08-14 DIAGNOSIS — F411 Generalized anxiety disorder: Secondary | ICD-10-CM | POA: Diagnosis not present

## 2023-08-14 DIAGNOSIS — F331 Major depressive disorder, recurrent, moderate: Secondary | ICD-10-CM | POA: Diagnosis not present

## 2023-08-17 DIAGNOSIS — F4001 Agoraphobia with panic disorder: Secondary | ICD-10-CM | POA: Diagnosis not present

## 2023-08-17 DIAGNOSIS — F411 Generalized anxiety disorder: Secondary | ICD-10-CM | POA: Diagnosis not present

## 2023-08-17 DIAGNOSIS — F331 Major depressive disorder, recurrent, moderate: Secondary | ICD-10-CM | POA: Diagnosis not present

## 2023-08-18 DIAGNOSIS — F4001 Agoraphobia with panic disorder: Secondary | ICD-10-CM | POA: Diagnosis not present

## 2023-08-18 DIAGNOSIS — F331 Major depressive disorder, recurrent, moderate: Secondary | ICD-10-CM | POA: Diagnosis not present

## 2023-08-18 DIAGNOSIS — F411 Generalized anxiety disorder: Secondary | ICD-10-CM | POA: Diagnosis not present

## 2023-08-18 DIAGNOSIS — F410 Panic disorder [episodic paroxysmal anxiety] without agoraphobia: Secondary | ICD-10-CM | POA: Diagnosis not present

## 2023-08-20 DIAGNOSIS — F4001 Agoraphobia with panic disorder: Secondary | ICD-10-CM | POA: Diagnosis not present

## 2023-08-20 DIAGNOSIS — F411 Generalized anxiety disorder: Secondary | ICD-10-CM | POA: Diagnosis not present

## 2023-08-20 DIAGNOSIS — F331 Major depressive disorder, recurrent, moderate: Secondary | ICD-10-CM | POA: Diagnosis not present

## 2023-08-24 DIAGNOSIS — F4001 Agoraphobia with panic disorder: Secondary | ICD-10-CM | POA: Diagnosis not present

## 2023-08-24 DIAGNOSIS — F331 Major depressive disorder, recurrent, moderate: Secondary | ICD-10-CM | POA: Diagnosis not present

## 2023-08-24 DIAGNOSIS — F411 Generalized anxiety disorder: Secondary | ICD-10-CM | POA: Diagnosis not present

## 2023-08-27 DIAGNOSIS — F331 Major depressive disorder, recurrent, moderate: Secondary | ICD-10-CM | POA: Diagnosis not present

## 2023-08-27 DIAGNOSIS — F411 Generalized anxiety disorder: Secondary | ICD-10-CM | POA: Diagnosis not present

## 2023-08-27 DIAGNOSIS — F4001 Agoraphobia with panic disorder: Secondary | ICD-10-CM | POA: Diagnosis not present

## 2023-08-31 DIAGNOSIS — F331 Major depressive disorder, recurrent, moderate: Secondary | ICD-10-CM | POA: Diagnosis not present

## 2023-08-31 DIAGNOSIS — F411 Generalized anxiety disorder: Secondary | ICD-10-CM | POA: Diagnosis not present

## 2023-08-31 DIAGNOSIS — F4001 Agoraphobia with panic disorder: Secondary | ICD-10-CM | POA: Diagnosis not present

## 2023-09-03 DIAGNOSIS — F411 Generalized anxiety disorder: Secondary | ICD-10-CM | POA: Diagnosis not present

## 2023-09-03 DIAGNOSIS — F4001 Agoraphobia with panic disorder: Secondary | ICD-10-CM | POA: Diagnosis not present

## 2023-09-03 DIAGNOSIS — F331 Major depressive disorder, recurrent, moderate: Secondary | ICD-10-CM | POA: Diagnosis not present

## 2023-09-07 DIAGNOSIS — F331 Major depressive disorder, recurrent, moderate: Secondary | ICD-10-CM | POA: Diagnosis not present

## 2023-09-07 DIAGNOSIS — F4001 Agoraphobia with panic disorder: Secondary | ICD-10-CM | POA: Diagnosis not present

## 2023-09-07 DIAGNOSIS — F411 Generalized anxiety disorder: Secondary | ICD-10-CM | POA: Diagnosis not present

## 2023-09-10 DIAGNOSIS — F4001 Agoraphobia with panic disorder: Secondary | ICD-10-CM | POA: Diagnosis not present

## 2023-09-10 DIAGNOSIS — F331 Major depressive disorder, recurrent, moderate: Secondary | ICD-10-CM | POA: Diagnosis not present

## 2023-09-10 DIAGNOSIS — F411 Generalized anxiety disorder: Secondary | ICD-10-CM | POA: Diagnosis not present

## 2023-09-14 DIAGNOSIS — F411 Generalized anxiety disorder: Secondary | ICD-10-CM | POA: Diagnosis not present

## 2023-09-14 DIAGNOSIS — F331 Major depressive disorder, recurrent, moderate: Secondary | ICD-10-CM | POA: Diagnosis not present

## 2023-09-14 DIAGNOSIS — F4001 Agoraphobia with panic disorder: Secondary | ICD-10-CM | POA: Diagnosis not present

## 2023-09-18 DIAGNOSIS — F4001 Agoraphobia with panic disorder: Secondary | ICD-10-CM | POA: Diagnosis not present

## 2023-09-18 DIAGNOSIS — F411 Generalized anxiety disorder: Secondary | ICD-10-CM | POA: Diagnosis not present

## 2023-09-18 DIAGNOSIS — F331 Major depressive disorder, recurrent, moderate: Secondary | ICD-10-CM | POA: Diagnosis not present

## 2023-09-21 DIAGNOSIS — F331 Major depressive disorder, recurrent, moderate: Secondary | ICD-10-CM | POA: Diagnosis not present

## 2023-09-21 DIAGNOSIS — F411 Generalized anxiety disorder: Secondary | ICD-10-CM | POA: Diagnosis not present

## 2023-09-21 DIAGNOSIS — F4001 Agoraphobia with panic disorder: Secondary | ICD-10-CM | POA: Diagnosis not present

## 2023-09-28 ENCOUNTER — Encounter: Payer: Self-pay | Admitting: Nurse Practitioner

## 2023-09-28 ENCOUNTER — Telehealth: Payer: Self-pay

## 2023-09-28 DIAGNOSIS — F4001 Agoraphobia with panic disorder: Secondary | ICD-10-CM | POA: Diagnosis not present

## 2023-09-28 DIAGNOSIS — F331 Major depressive disorder, recurrent, moderate: Secondary | ICD-10-CM | POA: Diagnosis not present

## 2023-09-28 DIAGNOSIS — F411 Generalized anxiety disorder: Secondary | ICD-10-CM | POA: Diagnosis not present

## 2023-09-28 NOTE — Telephone Encounter (Signed)
Pt called the office requesting to be scheduled for a VV with PCP. Pt states she has panic disorder and physically cannot come into the office, and PCP is aware.  Pt reports experiencing heart palpitations, which started about six months ago, with her panic attacks. Pt had a panic attack recently where she "almost blacked out but didn't." Pt reached out to hr psychiatrist who advised her that it is rare, but amitriptyline (ELAVIL) 25 MG tablet could cause heart palpitations and that pt needs to have an EKG done. Pt states because she physically cannot come into the office due to panic disorder, she did attempt to go to UC, which resulted in a panic attack, so she called EMS to perform an EKG. Pt has the EKG results for PCP to review.  Advised that typically with these symptoms, in-person appointment is needed. Pt advised to upload results to MyChart and I will send a message to PCP asking if a VV should be scheduled pending PCP aware of pt history.

## 2023-09-29 DIAGNOSIS — F411 Generalized anxiety disorder: Secondary | ICD-10-CM | POA: Diagnosis not present

## 2023-09-29 DIAGNOSIS — F4001 Agoraphobia with panic disorder: Secondary | ICD-10-CM | POA: Diagnosis not present

## 2023-09-29 DIAGNOSIS — F331 Major depressive disorder, recurrent, moderate: Secondary | ICD-10-CM | POA: Diagnosis not present

## 2023-10-02 DIAGNOSIS — F411 Generalized anxiety disorder: Secondary | ICD-10-CM | POA: Diagnosis not present

## 2023-10-02 DIAGNOSIS — F4001 Agoraphobia with panic disorder: Secondary | ICD-10-CM | POA: Diagnosis not present

## 2023-10-02 DIAGNOSIS — F331 Major depressive disorder, recurrent, moderate: Secondary | ICD-10-CM | POA: Diagnosis not present

## 2023-10-04 ENCOUNTER — Encounter (INDEPENDENT_AMBULATORY_CARE_PROVIDER_SITE_OTHER): Payer: Self-pay

## 2023-10-05 NOTE — Telephone Encounter (Signed)
Contacted patient . Verified patients name and DOB.   Patient stated that she was eating dinner last night and her right hand starting shaking - unsure how long it was shaking maybe 15-30 minutes. Patient starting having a panic attack. Patient has a Panic disorder so she's unsure as to how much of this is contributed to anxiety.  She began to get really weak, but she did not loose consciousness. Patient stated that she had really bad brain fog after. Per patient, the brain fog doesn't usually happen so she fears this could be something more than a panic attack.  She reported that she has been taking her Keppra as prescribed and she has not been sick or under any undue stress, other than the panic disorder.    She also reported that she has been dizzy a lot lately. Patient stated that when she goes to look at straight lines they appear to be moving. This doesn't happen often. But has happened recently. When asked, patient couldn't answer whether she'd been staring at the lines too long for this to happen.  I informed patient that I would send this message to the provider who would respond at her earliest convenience.   SS, CCMA

## 2023-10-05 NOTE — Telephone Encounter (Signed)
I called and spoke with Alison Brock. I told her that the description of the event did not sound consistent with seizure but sounded more like anxiety preceding a panic attack. She said she she had been sleeping poorly recently and that her psychiatrist had recommended Mirtazapine but that she had been reluctant to take it. I reassured [name] that it was ok for her to take the medication and encouraged her to continue close follow up with psychiatry and her therapist. TG

## 2023-10-06 DIAGNOSIS — F4001 Agoraphobia with panic disorder: Secondary | ICD-10-CM | POA: Diagnosis not present

## 2023-10-06 DIAGNOSIS — F411 Generalized anxiety disorder: Secondary | ICD-10-CM | POA: Diagnosis not present

## 2023-10-06 DIAGNOSIS — F331 Major depressive disorder, recurrent, moderate: Secondary | ICD-10-CM | POA: Diagnosis not present

## 2023-10-09 DIAGNOSIS — F4001 Agoraphobia with panic disorder: Secondary | ICD-10-CM | POA: Diagnosis not present

## 2023-10-09 DIAGNOSIS — F4312 Post-traumatic stress disorder, chronic: Secondary | ICD-10-CM | POA: Diagnosis not present

## 2023-10-09 DIAGNOSIS — F411 Generalized anxiety disorder: Secondary | ICD-10-CM | POA: Diagnosis not present

## 2023-10-09 DIAGNOSIS — F331 Major depressive disorder, recurrent, moderate: Secondary | ICD-10-CM | POA: Diagnosis not present

## 2023-10-16 DIAGNOSIS — F4001 Agoraphobia with panic disorder: Secondary | ICD-10-CM | POA: Diagnosis not present

## 2023-10-16 DIAGNOSIS — F4312 Post-traumatic stress disorder, chronic: Secondary | ICD-10-CM | POA: Diagnosis not present

## 2023-10-16 DIAGNOSIS — F411 Generalized anxiety disorder: Secondary | ICD-10-CM | POA: Diagnosis not present

## 2023-10-16 DIAGNOSIS — F331 Major depressive disorder, recurrent, moderate: Secondary | ICD-10-CM | POA: Diagnosis not present

## 2023-10-17 DIAGNOSIS — F331 Major depressive disorder, recurrent, moderate: Secondary | ICD-10-CM | POA: Diagnosis not present

## 2023-10-17 DIAGNOSIS — F4312 Post-traumatic stress disorder, chronic: Secondary | ICD-10-CM | POA: Diagnosis not present

## 2023-10-17 DIAGNOSIS — F411 Generalized anxiety disorder: Secondary | ICD-10-CM | POA: Diagnosis not present

## 2023-10-17 DIAGNOSIS — F4001 Agoraphobia with panic disorder: Secondary | ICD-10-CM | POA: Diagnosis not present

## 2023-10-19 DIAGNOSIS — F411 Generalized anxiety disorder: Secondary | ICD-10-CM | POA: Diagnosis not present

## 2023-10-19 DIAGNOSIS — F4001 Agoraphobia with panic disorder: Secondary | ICD-10-CM | POA: Diagnosis not present

## 2023-10-19 DIAGNOSIS — F4312 Post-traumatic stress disorder, chronic: Secondary | ICD-10-CM | POA: Diagnosis not present

## 2023-10-19 DIAGNOSIS — F331 Major depressive disorder, recurrent, moderate: Secondary | ICD-10-CM | POA: Diagnosis not present

## 2023-10-22 DIAGNOSIS — F331 Major depressive disorder, recurrent, moderate: Secondary | ICD-10-CM | POA: Diagnosis not present

## 2023-10-22 DIAGNOSIS — F411 Generalized anxiety disorder: Secondary | ICD-10-CM | POA: Diagnosis not present

## 2023-10-22 DIAGNOSIS — F4312 Post-traumatic stress disorder, chronic: Secondary | ICD-10-CM | POA: Diagnosis not present

## 2023-10-22 DIAGNOSIS — F4001 Agoraphobia with panic disorder: Secondary | ICD-10-CM | POA: Diagnosis not present

## 2023-10-26 DIAGNOSIS — F411 Generalized anxiety disorder: Secondary | ICD-10-CM | POA: Diagnosis not present

## 2023-10-26 DIAGNOSIS — F410 Panic disorder [episodic paroxysmal anxiety] without agoraphobia: Secondary | ICD-10-CM | POA: Diagnosis not present

## 2023-10-26 DIAGNOSIS — F331 Major depressive disorder, recurrent, moderate: Secondary | ICD-10-CM | POA: Diagnosis not present

## 2023-10-26 DIAGNOSIS — F4312 Post-traumatic stress disorder, chronic: Secondary | ICD-10-CM | POA: Diagnosis not present

## 2023-10-26 DIAGNOSIS — F4001 Agoraphobia with panic disorder: Secondary | ICD-10-CM | POA: Diagnosis not present

## 2023-10-27 DIAGNOSIS — F4001 Agoraphobia with panic disorder: Secondary | ICD-10-CM | POA: Diagnosis not present

## 2023-10-27 DIAGNOSIS — F411 Generalized anxiety disorder: Secondary | ICD-10-CM | POA: Diagnosis not present

## 2023-10-27 DIAGNOSIS — F4312 Post-traumatic stress disorder, chronic: Secondary | ICD-10-CM | POA: Diagnosis not present

## 2023-10-27 DIAGNOSIS — F331 Major depressive disorder, recurrent, moderate: Secondary | ICD-10-CM | POA: Diagnosis not present

## 2023-10-29 DIAGNOSIS — F4312 Post-traumatic stress disorder, chronic: Secondary | ICD-10-CM | POA: Diagnosis not present

## 2023-10-29 DIAGNOSIS — F331 Major depressive disorder, recurrent, moderate: Secondary | ICD-10-CM | POA: Diagnosis not present

## 2023-10-29 DIAGNOSIS — F411 Generalized anxiety disorder: Secondary | ICD-10-CM | POA: Diagnosis not present

## 2023-10-29 DIAGNOSIS — F4001 Agoraphobia with panic disorder: Secondary | ICD-10-CM | POA: Diagnosis not present

## 2023-11-02 DIAGNOSIS — F411 Generalized anxiety disorder: Secondary | ICD-10-CM | POA: Diagnosis not present

## 2023-11-02 DIAGNOSIS — F4001 Agoraphobia with panic disorder: Secondary | ICD-10-CM | POA: Diagnosis not present

## 2023-11-02 DIAGNOSIS — F331 Major depressive disorder, recurrent, moderate: Secondary | ICD-10-CM | POA: Diagnosis not present

## 2023-11-02 DIAGNOSIS — F4312 Post-traumatic stress disorder, chronic: Secondary | ICD-10-CM | POA: Diagnosis not present

## 2023-11-04 ENCOUNTER — Telehealth (INDEPENDENT_AMBULATORY_CARE_PROVIDER_SITE_OTHER): Payer: 59 | Admitting: Family

## 2023-11-05 DIAGNOSIS — F4312 Post-traumatic stress disorder, chronic: Secondary | ICD-10-CM | POA: Diagnosis not present

## 2023-11-05 DIAGNOSIS — F411 Generalized anxiety disorder: Secondary | ICD-10-CM | POA: Diagnosis not present

## 2023-11-05 DIAGNOSIS — F4001 Agoraphobia with panic disorder: Secondary | ICD-10-CM | POA: Diagnosis not present

## 2023-11-05 DIAGNOSIS — F331 Major depressive disorder, recurrent, moderate: Secondary | ICD-10-CM | POA: Diagnosis not present

## 2023-11-10 DIAGNOSIS — F4312 Post-traumatic stress disorder, chronic: Secondary | ICD-10-CM | POA: Diagnosis not present

## 2023-11-10 DIAGNOSIS — F331 Major depressive disorder, recurrent, moderate: Secondary | ICD-10-CM | POA: Diagnosis not present

## 2023-11-10 DIAGNOSIS — F411 Generalized anxiety disorder: Secondary | ICD-10-CM | POA: Diagnosis not present

## 2023-11-10 DIAGNOSIS — F4001 Agoraphobia with panic disorder: Secondary | ICD-10-CM | POA: Diagnosis not present

## 2023-11-13 DIAGNOSIS — F4001 Agoraphobia with panic disorder: Secondary | ICD-10-CM | POA: Diagnosis not present

## 2023-11-13 DIAGNOSIS — F411 Generalized anxiety disorder: Secondary | ICD-10-CM | POA: Diagnosis not present

## 2023-11-13 DIAGNOSIS — F331 Major depressive disorder, recurrent, moderate: Secondary | ICD-10-CM | POA: Diagnosis not present

## 2023-11-13 DIAGNOSIS — F4312 Post-traumatic stress disorder, chronic: Secondary | ICD-10-CM | POA: Diagnosis not present

## 2023-11-16 DIAGNOSIS — F331 Major depressive disorder, recurrent, moderate: Secondary | ICD-10-CM | POA: Diagnosis not present

## 2023-11-16 DIAGNOSIS — F4312 Post-traumatic stress disorder, chronic: Secondary | ICD-10-CM | POA: Diagnosis not present

## 2023-11-16 DIAGNOSIS — F4001 Agoraphobia with panic disorder: Secondary | ICD-10-CM | POA: Diagnosis not present

## 2023-11-16 DIAGNOSIS — F411 Generalized anxiety disorder: Secondary | ICD-10-CM | POA: Diagnosis not present

## 2023-11-19 DIAGNOSIS — F4001 Agoraphobia with panic disorder: Secondary | ICD-10-CM | POA: Diagnosis not present

## 2023-11-19 DIAGNOSIS — F4312 Post-traumatic stress disorder, chronic: Secondary | ICD-10-CM | POA: Diagnosis not present

## 2023-11-19 DIAGNOSIS — F331 Major depressive disorder, recurrent, moderate: Secondary | ICD-10-CM | POA: Diagnosis not present

## 2023-11-19 DIAGNOSIS — F411 Generalized anxiety disorder: Secondary | ICD-10-CM | POA: Diagnosis not present

## 2023-11-23 DIAGNOSIS — F411 Generalized anxiety disorder: Secondary | ICD-10-CM | POA: Diagnosis not present

## 2023-11-23 DIAGNOSIS — F4001 Agoraphobia with panic disorder: Secondary | ICD-10-CM | POA: Diagnosis not present

## 2023-11-23 DIAGNOSIS — F331 Major depressive disorder, recurrent, moderate: Secondary | ICD-10-CM | POA: Diagnosis not present

## 2023-11-23 DIAGNOSIS — F4312 Post-traumatic stress disorder, chronic: Secondary | ICD-10-CM | POA: Diagnosis not present

## 2023-11-26 DIAGNOSIS — F4312 Post-traumatic stress disorder, chronic: Secondary | ICD-10-CM | POA: Diagnosis not present

## 2023-11-26 DIAGNOSIS — F331 Major depressive disorder, recurrent, moderate: Secondary | ICD-10-CM | POA: Diagnosis not present

## 2023-11-26 DIAGNOSIS — F411 Generalized anxiety disorder: Secondary | ICD-10-CM | POA: Diagnosis not present

## 2023-11-26 DIAGNOSIS — F4001 Agoraphobia with panic disorder: Secondary | ICD-10-CM | POA: Diagnosis not present

## 2023-11-30 DIAGNOSIS — F411 Generalized anxiety disorder: Secondary | ICD-10-CM | POA: Diagnosis not present

## 2023-11-30 DIAGNOSIS — F331 Major depressive disorder, recurrent, moderate: Secondary | ICD-10-CM | POA: Diagnosis not present

## 2023-11-30 DIAGNOSIS — F4001 Agoraphobia with panic disorder: Secondary | ICD-10-CM | POA: Diagnosis not present

## 2023-11-30 DIAGNOSIS — F4312 Post-traumatic stress disorder, chronic: Secondary | ICD-10-CM | POA: Diagnosis not present

## 2023-12-03 DIAGNOSIS — F4001 Agoraphobia with panic disorder: Secondary | ICD-10-CM | POA: Diagnosis not present

## 2023-12-03 DIAGNOSIS — F411 Generalized anxiety disorder: Secondary | ICD-10-CM | POA: Diagnosis not present

## 2023-12-03 DIAGNOSIS — F4312 Post-traumatic stress disorder, chronic: Secondary | ICD-10-CM | POA: Diagnosis not present

## 2023-12-03 DIAGNOSIS — F331 Major depressive disorder, recurrent, moderate: Secondary | ICD-10-CM | POA: Diagnosis not present

## 2023-12-07 DIAGNOSIS — F411 Generalized anxiety disorder: Secondary | ICD-10-CM | POA: Diagnosis not present

## 2023-12-07 DIAGNOSIS — F4001 Agoraphobia with panic disorder: Secondary | ICD-10-CM | POA: Diagnosis not present

## 2023-12-07 DIAGNOSIS — F331 Major depressive disorder, recurrent, moderate: Secondary | ICD-10-CM | POA: Diagnosis not present

## 2023-12-07 DIAGNOSIS — F4312 Post-traumatic stress disorder, chronic: Secondary | ICD-10-CM | POA: Diagnosis not present

## 2023-12-08 DIAGNOSIS — F410 Panic disorder [episodic paroxysmal anxiety] without agoraphobia: Secondary | ICD-10-CM | POA: Diagnosis not present

## 2023-12-08 DIAGNOSIS — F331 Major depressive disorder, recurrent, moderate: Secondary | ICD-10-CM | POA: Diagnosis not present

## 2023-12-08 DIAGNOSIS — F411 Generalized anxiety disorder: Secondary | ICD-10-CM | POA: Diagnosis not present

## 2023-12-08 DIAGNOSIS — F4001 Agoraphobia with panic disorder: Secondary | ICD-10-CM | POA: Diagnosis not present

## 2023-12-10 DIAGNOSIS — F411 Generalized anxiety disorder: Secondary | ICD-10-CM | POA: Diagnosis not present

## 2023-12-10 DIAGNOSIS — F4312 Post-traumatic stress disorder, chronic: Secondary | ICD-10-CM | POA: Diagnosis not present

## 2023-12-10 DIAGNOSIS — F331 Major depressive disorder, recurrent, moderate: Secondary | ICD-10-CM | POA: Diagnosis not present

## 2023-12-10 DIAGNOSIS — F4001 Agoraphobia with panic disorder: Secondary | ICD-10-CM | POA: Diagnosis not present

## 2023-12-14 ENCOUNTER — Other Ambulatory Visit (INDEPENDENT_AMBULATORY_CARE_PROVIDER_SITE_OTHER): Payer: Self-pay

## 2023-12-14 DIAGNOSIS — F4001 Agoraphobia with panic disorder: Secondary | ICD-10-CM | POA: Diagnosis not present

## 2023-12-14 DIAGNOSIS — F4312 Post-traumatic stress disorder, chronic: Secondary | ICD-10-CM | POA: Diagnosis not present

## 2023-12-14 DIAGNOSIS — F331 Major depressive disorder, recurrent, moderate: Secondary | ICD-10-CM | POA: Diagnosis not present

## 2023-12-14 DIAGNOSIS — R519 Headache, unspecified: Secondary | ICD-10-CM

## 2023-12-14 DIAGNOSIS — F411 Generalized anxiety disorder: Secondary | ICD-10-CM | POA: Diagnosis not present

## 2023-12-14 MED ORDER — AMITRIPTYLINE HCL 25 MG PO TABS
ORAL_TABLET | ORAL | 0 refills | Status: DC
Start: 2023-12-14 — End: 2023-12-19

## 2023-12-17 ENCOUNTER — Telehealth (INDEPENDENT_AMBULATORY_CARE_PROVIDER_SITE_OTHER): Payer: 59 | Admitting: Family

## 2023-12-17 ENCOUNTER — Encounter (INDEPENDENT_AMBULATORY_CARE_PROVIDER_SITE_OTHER): Payer: Self-pay | Admitting: Family

## 2023-12-17 VITALS — Ht 63.0 in

## 2023-12-17 DIAGNOSIS — F410 Panic disorder [episodic paroxysmal anxiety] without agoraphobia: Secondary | ICD-10-CM

## 2023-12-17 DIAGNOSIS — F419 Anxiety disorder, unspecified: Secondary | ICD-10-CM | POA: Diagnosis not present

## 2023-12-17 DIAGNOSIS — R519 Headache, unspecified: Secondary | ICD-10-CM | POA: Diagnosis not present

## 2023-12-17 DIAGNOSIS — G40B09 Juvenile myoclonic epilepsy, not intractable, without status epilepticus: Secondary | ICD-10-CM

## 2023-12-17 NOTE — Patient Instructions (Signed)
 It was a pleasure to see you today! I am very happy that you are doing so well!  Instructions for you until your next appointment are as follows: Continue your medications as prescribed Let me know if you have any seizures or have any other concerns Please sign up for MyChart if you have not done so. Please plan to return for follow up in 6 months or sooner if needed.  Feel free to contact our office during normal business hours at [phone] with questions or concerns. If there is no answer or the call is outside business hours, please leave a message and our clinic staff will call you back within the next business day.  If you have an urgent concern, please stay on the line for our after-hours answering service and ask for the on-call neurologist.     I also encourage you to use MyChart to communicate with me more directly. If you have not yet signed up for MyChart within [facility], the front desk staff can help you. However, please note that this inbox is NOT monitored on nights or weekends, and response can take up to 2 business days.  Urgent matters should be discussed with the on-call pediatric neurologist.   At [facility], we are committed to providing exceptional care. You will receive a patient satisfaction survey through text or email regarding your visit today. Your opinion is important to me. Comments are appreciated.

## 2023-12-17 NOTE — Progress Notes (Signed)
 This is a Pediatric Specialist E-Visit consult/follow up provided via My Chart Video Visit (Caregility). Alison Brock and her mother Alison Brock consented to an E-Visit consult today.  Is the patient present for the video visit? Yes Location of patient: "Alison Brock" is at home Is the patient located in the state of [place]? Yes Location of provider: Damita Dunnings is at office Patient was referred by Nche, Bonna Gains, NP   The following participants were involved in this E-Visit: Damita Dunnings,  Daneen Schick, CMA  patient and parent  This visit was done via VIDEO   Chief Complain/ Reason for E-Visit today: seizure follow up Total time on call: 20 minutes Follow up: 6 months    Alison Brock   MRN:  [id number]  Apr 07, 2001   Provider: Elveria Rising NP-C Location of Care: [facility]  Visit type: Return visit  Last visit: 05/28/2023  Referral source: Anne Ng, NP History from: Epic chart, patient and her mother   Brief history:  Copied from previous record: History of generalized seizure disorder based on clinical seizure activity and EEG, as well as headaches, anxiety and panic. She is taking and tolerating Levetiracetam XR for her seizure disorder and has remained seizure free since April 22, 2022. She is taking and tolerating Amitriptyline for migraine prevention. Alison Brock is being followed by a psychiatrist and a therapist   Today's concerns: She has remained seizure free since last visit She has a new therapist and feels that this is most therapeutic therapist she has worked with Her anxiety is under better control and she has been able to move back into her room from her mother's room, and has been able to go out of the house for short trips Has been under some stress as grandmother has been sick but has weathered it well Headaches have not been problematic Wants to work on weight loss.  She is working on a plan to be able to go into her PCP office for help with that. Alison Brock has been otherwise generally healthy since she was last seen. No health concerns today other than previously mentioned.  Review of systems: Please see HPI for neurologic and other pertinent review of systems. Otherwise all other systems were reviewed and were negative.  Problem List: Patient Active Problem List   Diagnosis Date Noted   Left foot pain 03/07/2022   Encounter for long-term current use of medication 11/18/2020   Pyelonephritis 06/02/2019   Migraine variants, not intractable 02/09/2019   Panic disorder 12/25/2018   Social anxiety disorder 10/06/2018   Frequent headaches 07/08/2016   Anxiety 06/26/2016   Nonintractable juvenile myoclonic epilepsy without status epilepticus (HCC) 06/26/2016   Seizure disorder (HCC) 06/26/2016   AP (abdominal pain) 06/02/2016   Loss of weight 06/02/2016   Constipation 06/02/2016     Past Medical History:  Diagnosis Date   Anxiety    Seizures (HCC)     Past medical history comments: See HPI  Surgical history: Past Surgical History:  Procedure Laterality Date   GUM SURGERY  03/2016     Family history: family history includes Alcohol abuse in her father; Anxiety disorder in her maternal aunt; Bipolar disorder in her maternal aunt; Depression in her paternal grandfather; Drug abuse in her father; Hypertension in her maternal grandmother, mother, and paternal grandfather; Uterine cancer in her mother.   Social history: Social History   Socioeconomic History   Marital status: Single    Spouse name: Not on  file   Number of children: Not on file   Years of education: Not on file   Highest education level: Not on file  Occupational History   Not on file  Tobacco Use   Smoking status: Every Day    Types: E-cigarettes    Start date: 08/20/2021    Passive exposure: Yes   Smokeless tobacco: Never  Vaping Use   Vaping status: Every Day    Start date: 10/21/2019   Substances: Nicotine   Devices: Vuse  Substance and Sexual Activity   Alcohol use: No   Drug use: No   Sexual activity: Never  Other Topics Concern   Not on file  Social History Narrative   Lives with her mother.   1 dog   Alison Brock Vapes   Social Drivers of Health   Financial Resource Strain: Unknown (06/02/2019)   Overall Financial Resource Strain (CARDIA)    Difficulty of Paying Living Expenses: Patient declined  Food Insecurity: Unknown (06/02/2019)   Hunger Vital Sign    Worried About Running Out of Food in the Last Year: Patient declined    Ran Out of Food in the Last Year: Patient declined  Transportation Needs: Unknown (06/02/2019)   PRAPARE - Administrator, Civil Service (Medical): Patient declined    Lack of Transportation (Non-Medical): Patient declined  Physical Activity: Unknown (06/02/2019)   Exercise Vital Sign    Days of Exercise per Week: Patient declined    Minutes of Exercise per Session: Patient declined  Stress: Unknown (06/02/2019)   [employer] of Occupational Health - Occupational Stress Questionnaire    Feeling of Stress : Patient declined  Social Connections: Unknown (06/02/2019)   Social Connection and Isolation Panel [NHANES]    Frequency of Communication with Friends and Family: Patient declined    Frequency of Social Gatherings with Friends and Family: Patient declined    Attends Religious Services: Patient declined    Database administrator or Organizations: Patient declined    Attends Banker Meetings: Patient declined    Marital Status: Patient declined  Intimate Partner Violence: Unknown (06/02/2019)   Humiliation, Afraid, Rape, and Kick questionnaire    Fear of Current or Ex-Partner: Patient declined    Emotionally Abused: Patient declined    Physically Abused: Patient declined    Sexually Abused: Patient declined    Past/failed meds: Copied from previous record: Topiramate ER - worsened  anxiety and panic  Klonopin - ineffective Fluoxetine - ineffective  Allergies: Allergies  Allergen Reactions   Sulfamethoxazole-Trimethoprim Nausea And Vomiting   Bentyl [Dicyclomine Hcl]    Doxycycline Other (See Comments)    Nausea and hallucination   Grapefruit Extract Other (See Comments)    Pt is not supposed to eat grapefruit with medication    Other     Seasonal Allergies - Spring and Fall   Topiramate Er     Immunizations: Immunization History  Administered Date(s) Administered   PFIZER(Purple Top)SARS-COV-2 Vaccination 07/07/2020, 08/04/2020   Tdap 01/10/2013    Diagnostics/Screenings: Copied from previous record: 12/07/2022 - extended EEG - This routine EEG for 71 minutes during awake state is slightly abnormal due to occasional sporadic brief discharges in the form of spikes and slow wave activity, mostly in the frontal area but no other epileptiform discharges or seizure activity or any transient rhythmic activity noted. The findings are consistent with brief focal cortical irritability with slight increased epileptic potential.  Clinical correlation is indicated.   Keturah Shavers, MD  04/24/2021 - rEEG - This EEG is unremarkable during awake state. There were just a couple of single sharps in the bilateral frontal area which is not significant.  This is significant improvement compared to her initial EEGs with frequent generalized discharges. Please note that normal EEG does not exclude epilepsy, clinical correlation is indicated.  Keturah Shavers, MD   04/29/18 - rEEG - This EEG is abnormal due to episodes of generalized discharges, more frontally predominant throughout the recording. The findings consistent with generalized seizure disorder, possibly juvenile myoclonic epilepsy, associated with lower seizure threshold and require careful clinical correlation    07/27/2016 - MRI Brain w/wo contrast - Artifact related to braces. Allowing for that, the examination is  normal. No evidence of malformation. No evidence of acquired brain disease.   Physical Exam: Ht 5\' 3"  (1.6 m)   LMP 12/03/2023   BMI 37.73 kg/m   Examination was limited by video format General: well developed, well nourished young woman, seated at her home, in no evident distress Head: normocephalic and atraumatic. No dysmorphic features. Neck: supple Musculoskeletal: No skeletal deformities or obvious scoliosis Skin: no rashes or neurocutaneous lesions  Neurologic Exam Mental Status: Awake and fully alert.  Attention span, concentration, and fund of knowledge appropriate for age.  Speech fluent without dysarthria.  Able to follow commands and participate in examination. Cranial Nerves: Turns to localize faces, objects and sounds in the periphery. Facial sensation intact.  Face, tongue, palate move normally and symmetrically. Motor: Normal functional bulk, tone and strength Sensory: Intact to touch and temperature in all extremities. Coordination: No dysmetria with reach for objects. Balance adequate Gait and Station: Normal gait and stance  Impression: Nonintractable juvenile myoclonic epilepsy without status epilepticus (HCC) - Plan: levETIRAcetam (KEPPRA XR) 750 MG 24 hr tablet  Frequent headaches - Plan: amitriptyline (ELAVIL) 25 MG tablet  Anxiety  Panic disorder   Recommendations for plan of care: The patient's previous Epic records were reviewed. No recent diagnostic studies to be reviewed with the patient. I commended Horticulturist, commercial for continuing to work closely with her psychiatrist and therapist. She is making good progress at this time Plan until next visit: Continue medications as prescribed  Continue working with your psychiatrist and therapist Call if seizures occur or for questions or concerns Return in about 6 months (around 06/15/2024).  The medication list was reviewed and reconciled. No changes were made in the prescribed medications today. A complete medication list  was provided to the patient  Allergies as of 12/17/2023       Reactions   Sulfamethoxazole-trimethoprim Nausea And Vomiting   Bentyl [dicyclomine Hcl]    Doxycycline Other (See Comments)   Nausea and hallucination   Grapefruit Extract Other (See Comments)   Pt is not supposed to eat grapefruit with medication    Other    Seasonal Allergies - Spring and Fall   Topiramate Er         Medication List        Accurate as of December 17, 2023 11:59 PM. If you have any questions, ask your nurse or doctor.          amitriptyline 25 MG tablet Commonly known as: ELAVIL TAKE TWO TABLETS BY MOUTH EVERY NIGHT AT BEDTIME   busPIRone 15 MG tablet Commonly known as: BUSPAR Take 20 mg by mouth 2 (two) times daily.   levETIRAcetam 750 MG 24 hr tablet Commonly known as: KEPPRA XR TAKE 2 TABLETS BY MOUTH EVERY MORNING AND TAKE TWO TABLETS BY  MOUTH EVERY NIGHT AT BEDTIME   LORazepam 0.5 MG tablet Commonly known as: ATIVAN Take 1/2 tablet 3 times per day for anxiety What changed: additional instructions   Low-Ogestrel 0.3-30 MG-MCG tablet Generic drug: norgestrel-ethinyl estradiol Take 1 tablet by mouth daily.   Magnesium Oxide -Mg Supplement 500 MG Tabs Take 500 mg by mouth daily.   pyridOXINE 100 MG tablet Commonly known as: VITAMIN B6 Take 100 mg by mouth daily.   riboflavin 100 MG Tabs tablet Commonly known as: VITAMIN B-2 Take 100 mg by mouth daily.      Total time spent with the patient was 20 minutes, of which 50% or more was spent in counseling and coordination of care.  Elveria Rising NP-C [facility] [address] [place], [place] [address] Ph. [phone] Fax [phone]

## 2023-12-18 DIAGNOSIS — F4312 Post-traumatic stress disorder, chronic: Secondary | ICD-10-CM | POA: Diagnosis not present

## 2023-12-18 DIAGNOSIS — F4001 Agoraphobia with panic disorder: Secondary | ICD-10-CM | POA: Diagnosis not present

## 2023-12-18 DIAGNOSIS — F331 Major depressive disorder, recurrent, moderate: Secondary | ICD-10-CM | POA: Diagnosis not present

## 2023-12-18 DIAGNOSIS — F411 Generalized anxiety disorder: Secondary | ICD-10-CM | POA: Diagnosis not present

## 2023-12-19 ENCOUNTER — Encounter (INDEPENDENT_AMBULATORY_CARE_PROVIDER_SITE_OTHER): Payer: Self-pay | Admitting: Family

## 2023-12-19 MED ORDER — AMITRIPTYLINE HCL 25 MG PO TABS: ORAL_TABLET | ORAL | 5 refills | Status: DC

## 2023-12-19 MED ORDER — LEVETIRACETAM ER 750 MG PO TB24
ORAL_TABLET | ORAL | 5 refills | Status: DC
Start: 2023-12-19 — End: 2024-06-21

## 2023-12-21 DIAGNOSIS — F411 Generalized anxiety disorder: Secondary | ICD-10-CM | POA: Diagnosis not present

## 2023-12-21 DIAGNOSIS — F4001 Agoraphobia with panic disorder: Secondary | ICD-10-CM | POA: Diagnosis not present

## 2023-12-21 DIAGNOSIS — F4312 Post-traumatic stress disorder, chronic: Secondary | ICD-10-CM | POA: Diagnosis not present

## 2023-12-21 DIAGNOSIS — F331 Major depressive disorder, recurrent, moderate: Secondary | ICD-10-CM | POA: Diagnosis not present

## 2023-12-24 DIAGNOSIS — F4312 Post-traumatic stress disorder, chronic: Secondary | ICD-10-CM | POA: Diagnosis not present

## 2023-12-24 DIAGNOSIS — F4001 Agoraphobia with panic disorder: Secondary | ICD-10-CM | POA: Diagnosis not present

## 2023-12-24 DIAGNOSIS — F331 Major depressive disorder, recurrent, moderate: Secondary | ICD-10-CM | POA: Diagnosis not present

## 2023-12-24 DIAGNOSIS — F411 Generalized anxiety disorder: Secondary | ICD-10-CM | POA: Diagnosis not present

## 2023-12-28 DIAGNOSIS — F4312 Post-traumatic stress disorder, chronic: Secondary | ICD-10-CM | POA: Diagnosis not present

## 2023-12-28 DIAGNOSIS — F4001 Agoraphobia with panic disorder: Secondary | ICD-10-CM | POA: Diagnosis not present

## 2023-12-28 DIAGNOSIS — F411 Generalized anxiety disorder: Secondary | ICD-10-CM | POA: Diagnosis not present

## 2023-12-28 DIAGNOSIS — F331 Major depressive disorder, recurrent, moderate: Secondary | ICD-10-CM | POA: Diagnosis not present

## 2023-12-31 DIAGNOSIS — F4312 Post-traumatic stress disorder, chronic: Secondary | ICD-10-CM | POA: Diagnosis not present

## 2023-12-31 DIAGNOSIS — F411 Generalized anxiety disorder: Secondary | ICD-10-CM | POA: Diagnosis not present

## 2023-12-31 DIAGNOSIS — F4001 Agoraphobia with panic disorder: Secondary | ICD-10-CM | POA: Diagnosis not present

## 2023-12-31 DIAGNOSIS — F331 Major depressive disorder, recurrent, moderate: Secondary | ICD-10-CM | POA: Diagnosis not present

## 2024-01-04 DIAGNOSIS — F4001 Agoraphobia with panic disorder: Secondary | ICD-10-CM | POA: Diagnosis not present

## 2024-01-04 DIAGNOSIS — F411 Generalized anxiety disorder: Secondary | ICD-10-CM | POA: Diagnosis not present

## 2024-01-04 DIAGNOSIS — F4312 Post-traumatic stress disorder, chronic: Secondary | ICD-10-CM | POA: Diagnosis not present

## 2024-01-04 DIAGNOSIS — F331 Major depressive disorder, recurrent, moderate: Secondary | ICD-10-CM | POA: Diagnosis not present

## 2024-01-07 DIAGNOSIS — F411 Generalized anxiety disorder: Secondary | ICD-10-CM | POA: Diagnosis not present

## 2024-01-07 DIAGNOSIS — F4312 Post-traumatic stress disorder, chronic: Secondary | ICD-10-CM | POA: Diagnosis not present

## 2024-01-07 DIAGNOSIS — F331 Major depressive disorder, recurrent, moderate: Secondary | ICD-10-CM | POA: Diagnosis not present

## 2024-01-07 DIAGNOSIS — F4001 Agoraphobia with panic disorder: Secondary | ICD-10-CM | POA: Diagnosis not present

## 2024-01-11 DIAGNOSIS — F4001 Agoraphobia with panic disorder: Secondary | ICD-10-CM | POA: Diagnosis not present

## 2024-01-11 DIAGNOSIS — F4312 Post-traumatic stress disorder, chronic: Secondary | ICD-10-CM | POA: Diagnosis not present

## 2024-01-11 DIAGNOSIS — F331 Major depressive disorder, recurrent, moderate: Secondary | ICD-10-CM | POA: Diagnosis not present

## 2024-01-11 DIAGNOSIS — F411 Generalized anxiety disorder: Secondary | ICD-10-CM | POA: Diagnosis not present

## 2024-01-14 DIAGNOSIS — F4001 Agoraphobia with panic disorder: Secondary | ICD-10-CM | POA: Diagnosis not present

## 2024-01-14 DIAGNOSIS — F4312 Post-traumatic stress disorder, chronic: Secondary | ICD-10-CM | POA: Diagnosis not present

## 2024-01-14 DIAGNOSIS — F411 Generalized anxiety disorder: Secondary | ICD-10-CM | POA: Diagnosis not present

## 2024-01-14 DIAGNOSIS — F331 Major depressive disorder, recurrent, moderate: Secondary | ICD-10-CM | POA: Diagnosis not present

## 2024-01-14 DIAGNOSIS — F410 Panic disorder [episodic paroxysmal anxiety] without agoraphobia: Secondary | ICD-10-CM | POA: Diagnosis not present

## 2024-01-18 DIAGNOSIS — F4312 Post-traumatic stress disorder, chronic: Secondary | ICD-10-CM | POA: Diagnosis not present

## 2024-01-18 DIAGNOSIS — F4001 Agoraphobia with panic disorder: Secondary | ICD-10-CM | POA: Diagnosis not present

## 2024-01-18 DIAGNOSIS — F411 Generalized anxiety disorder: Secondary | ICD-10-CM | POA: Diagnosis not present

## 2024-01-18 DIAGNOSIS — F331 Major depressive disorder, recurrent, moderate: Secondary | ICD-10-CM | POA: Diagnosis not present

## 2024-01-21 DIAGNOSIS — F331 Major depressive disorder, recurrent, moderate: Secondary | ICD-10-CM | POA: Diagnosis not present

## 2024-01-21 DIAGNOSIS — F411 Generalized anxiety disorder: Secondary | ICD-10-CM | POA: Diagnosis not present

## 2024-01-21 DIAGNOSIS — F4312 Post-traumatic stress disorder, chronic: Secondary | ICD-10-CM | POA: Diagnosis not present

## 2024-01-21 DIAGNOSIS — F4001 Agoraphobia with panic disorder: Secondary | ICD-10-CM | POA: Diagnosis not present

## 2024-01-25 DIAGNOSIS — F4312 Post-traumatic stress disorder, chronic: Secondary | ICD-10-CM | POA: Diagnosis not present

## 2024-01-25 DIAGNOSIS — F331 Major depressive disorder, recurrent, moderate: Secondary | ICD-10-CM | POA: Diagnosis not present

## 2024-01-25 DIAGNOSIS — F411 Generalized anxiety disorder: Secondary | ICD-10-CM | POA: Diagnosis not present

## 2024-01-25 DIAGNOSIS — F4001 Agoraphobia with panic disorder: Secondary | ICD-10-CM | POA: Diagnosis not present

## 2024-01-28 DIAGNOSIS — F411 Generalized anxiety disorder: Secondary | ICD-10-CM | POA: Diagnosis not present

## 2024-01-28 DIAGNOSIS — F331 Major depressive disorder, recurrent, moderate: Secondary | ICD-10-CM | POA: Diagnosis not present

## 2024-01-28 DIAGNOSIS — F4312 Post-traumatic stress disorder, chronic: Secondary | ICD-10-CM | POA: Diagnosis not present

## 2024-01-28 DIAGNOSIS — F4001 Agoraphobia with panic disorder: Secondary | ICD-10-CM | POA: Diagnosis not present

## 2024-02-01 DIAGNOSIS — F331 Major depressive disorder, recurrent, moderate: Secondary | ICD-10-CM | POA: Diagnosis not present

## 2024-02-01 DIAGNOSIS — F4312 Post-traumatic stress disorder, chronic: Secondary | ICD-10-CM | POA: Diagnosis not present

## 2024-02-01 DIAGNOSIS — F4001 Agoraphobia with panic disorder: Secondary | ICD-10-CM | POA: Diagnosis not present

## 2024-02-01 DIAGNOSIS — F411 Generalized anxiety disorder: Secondary | ICD-10-CM | POA: Diagnosis not present

## 2024-02-02 DIAGNOSIS — F331 Major depressive disorder, recurrent, moderate: Secondary | ICD-10-CM | POA: Diagnosis not present

## 2024-02-02 DIAGNOSIS — F4312 Post-traumatic stress disorder, chronic: Secondary | ICD-10-CM | POA: Diagnosis not present

## 2024-02-02 DIAGNOSIS — F4001 Agoraphobia with panic disorder: Secondary | ICD-10-CM | POA: Diagnosis not present

## 2024-02-02 DIAGNOSIS — F411 Generalized anxiety disorder: Secondary | ICD-10-CM | POA: Diagnosis not present

## 2024-02-04 DIAGNOSIS — F331 Major depressive disorder, recurrent, moderate: Secondary | ICD-10-CM | POA: Diagnosis not present

## 2024-02-04 DIAGNOSIS — F4312 Post-traumatic stress disorder, chronic: Secondary | ICD-10-CM | POA: Diagnosis not present

## 2024-02-04 DIAGNOSIS — F4001 Agoraphobia with panic disorder: Secondary | ICD-10-CM | POA: Diagnosis not present

## 2024-02-04 DIAGNOSIS — F411 Generalized anxiety disorder: Secondary | ICD-10-CM | POA: Diagnosis not present

## 2024-02-07 ENCOUNTER — Other Ambulatory Visit: Payer: Self-pay | Admitting: Nurse Practitioner

## 2024-02-07 DIAGNOSIS — Z3041 Encounter for surveillance of contraceptive pills: Secondary | ICD-10-CM

## 2024-02-08 DIAGNOSIS — F4312 Post-traumatic stress disorder, chronic: Secondary | ICD-10-CM | POA: Diagnosis not present

## 2024-02-08 DIAGNOSIS — F4001 Agoraphobia with panic disorder: Secondary | ICD-10-CM | POA: Diagnosis not present

## 2024-02-08 DIAGNOSIS — F331 Major depressive disorder, recurrent, moderate: Secondary | ICD-10-CM | POA: Diagnosis not present

## 2024-02-08 DIAGNOSIS — F411 Generalized anxiety disorder: Secondary | ICD-10-CM | POA: Diagnosis not present

## 2024-02-09 DIAGNOSIS — F411 Generalized anxiety disorder: Secondary | ICD-10-CM | POA: Diagnosis not present

## 2024-02-09 DIAGNOSIS — F331 Major depressive disorder, recurrent, moderate: Secondary | ICD-10-CM | POA: Diagnosis not present

## 2024-02-09 DIAGNOSIS — F4001 Agoraphobia with panic disorder: Secondary | ICD-10-CM | POA: Diagnosis not present

## 2024-02-09 NOTE — Telephone Encounter (Signed)
 Sending this back to you for review.

## 2024-02-10 NOTE — Telephone Encounter (Signed)
 Patient is overdue for an office appointment. MyChart message sent and patient called. Left a detailed voice message for patient pertaining to refill request on birth control and getting scheduled for an office visit asked to give me a call at [phone].

## 2024-02-10 NOTE — Telephone Encounter (Signed)
 Medication: Low-Ogestrel  0.3-30 mg  Directions: Take 1 tablet by mouth daily  Last given: 03/12/23 Number refills: 3 Last o/v: 03/12/23 (VV)  Follow up: 2 months CPE Labs: N/A

## 2024-02-11 ENCOUNTER — Other Ambulatory Visit: Payer: Self-pay

## 2024-02-11 DIAGNOSIS — F4001 Agoraphobia with panic disorder: Secondary | ICD-10-CM | POA: Diagnosis not present

## 2024-02-11 DIAGNOSIS — F331 Major depressive disorder, recurrent, moderate: Secondary | ICD-10-CM | POA: Diagnosis not present

## 2024-02-11 DIAGNOSIS — F411 Generalized anxiety disorder: Secondary | ICD-10-CM | POA: Diagnosis not present

## 2024-02-11 DIAGNOSIS — Z3041 Encounter for surveillance of contraceptive pills: Secondary | ICD-10-CM

## 2024-02-11 DIAGNOSIS — F4312 Post-traumatic stress disorder, chronic: Secondary | ICD-10-CM | POA: Diagnosis not present

## 2024-02-11 MED ORDER — LOW-OGESTREL 0.3-30 MG-MCG PO TABS
1.0000 | ORAL_TABLET | Freq: Every day | ORAL | 0 refills | Status: DC
Start: 2024-02-11 — End: 2024-03-07

## 2024-02-15 DIAGNOSIS — F4312 Post-traumatic stress disorder, chronic: Secondary | ICD-10-CM | POA: Diagnosis not present

## 2024-02-15 DIAGNOSIS — F4001 Agoraphobia with panic disorder: Secondary | ICD-10-CM | POA: Diagnosis not present

## 2024-02-15 DIAGNOSIS — F411 Generalized anxiety disorder: Secondary | ICD-10-CM | POA: Diagnosis not present

## 2024-02-15 DIAGNOSIS — F331 Major depressive disorder, recurrent, moderate: Secondary | ICD-10-CM | POA: Diagnosis not present

## 2024-02-17 DIAGNOSIS — F4312 Post-traumatic stress disorder, chronic: Secondary | ICD-10-CM | POA: Diagnosis not present

## 2024-02-17 DIAGNOSIS — F331 Major depressive disorder, recurrent, moderate: Secondary | ICD-10-CM | POA: Diagnosis not present

## 2024-02-17 DIAGNOSIS — F411 Generalized anxiety disorder: Secondary | ICD-10-CM | POA: Diagnosis not present

## 2024-02-17 DIAGNOSIS — F4001 Agoraphobia with panic disorder: Secondary | ICD-10-CM | POA: Diagnosis not present

## 2024-02-22 DIAGNOSIS — F4001 Agoraphobia with panic disorder: Secondary | ICD-10-CM | POA: Diagnosis not present

## 2024-02-22 DIAGNOSIS — F4312 Post-traumatic stress disorder, chronic: Secondary | ICD-10-CM | POA: Diagnosis not present

## 2024-02-22 DIAGNOSIS — F331 Major depressive disorder, recurrent, moderate: Secondary | ICD-10-CM | POA: Diagnosis not present

## 2024-02-22 DIAGNOSIS — F411 Generalized anxiety disorder: Secondary | ICD-10-CM | POA: Diagnosis not present

## 2024-02-23 ENCOUNTER — Telehealth (INDEPENDENT_AMBULATORY_CARE_PROVIDER_SITE_OTHER): Payer: Self-pay | Admitting: Family

## 2024-02-23 NOTE — Telephone Encounter (Signed)
 I called and spoke with Alison Brock. She said that she been having increased stress over the last few days and then had an event last night. She has severe anxiety and is now alone during the day when her mother and her boyfriend are at work. She says that being alone has worsened anxiety. She is in therapy 2 days per week (virtual visits as she is too anxious to leave her home). Last night she awakened, had abdominal pain and while going to the bathroom and to get an ice pack, she felt panicked, then felt that she had a seizure. She said that she was able to lie down and cried during the event. She did not lose consciousness. Afterwards she had burning in one side of her head that gradually resolved. I told Germain Kohler that the event sounded more like a psychogenic seizure than an epileptic seizure. I encouraged ongoing close follow up with her therapist. Germain Kohler agreed with this plan.

## 2024-02-23 NOTE — Telephone Encounter (Signed)
 Contacted patient.  Verified patients name and DOB.   Patient stated that she had a seizure last night. She woke up at 3 AM with really bad stomach pain. She stated that she was nauseous. She went to get Ice packs and realized she was about to have a seizure.   Germain Kohler stated that she thought the seizure lasted for about 20 minutes . She stated that mom was there when this happened but mom is asleep now. She also stated that the right side of her head was burning after she came to.   She says that she doesn't know the difference between psychogenetic seizures and epileptic seizures.   She has been taken her medication as prescribed.   She also mentioned that she has been under a lot of stress lately.   No changes otherwise.   I informed Germain Kohler that I would relay this message to the provider and someone would get back with her shortly.   She verbalized understanding of this.   SS, CCMA

## 2024-02-23 NOTE — Telephone Encounter (Signed)
  Name of who is calling: [name]   Caller's Relationship to Patient:   Best contact number: [phone]  Provider they see: Brian Campanile   Reason for call: Patient called to speak with Brian Campanile, says she has a seizure last night and its been a while since she last had one. She would like a call back to discuss.      PRESCRIPTION REFILL ONLY  Name of prescription:  Pharmacy:

## 2024-02-27 DIAGNOSIS — F4312 Post-traumatic stress disorder, chronic: Secondary | ICD-10-CM | POA: Diagnosis not present

## 2024-02-27 DIAGNOSIS — F331 Major depressive disorder, recurrent, moderate: Secondary | ICD-10-CM | POA: Diagnosis not present

## 2024-02-27 DIAGNOSIS — F4001 Agoraphobia with panic disorder: Secondary | ICD-10-CM | POA: Diagnosis not present

## 2024-02-27 DIAGNOSIS — F411 Generalized anxiety disorder: Secondary | ICD-10-CM | POA: Diagnosis not present

## 2024-02-29 ENCOUNTER — Encounter (HOSPITAL_COMMUNITY): Payer: Self-pay

## 2024-03-05 ENCOUNTER — Ambulatory Visit
Admission: EM | Admit: 2024-03-05 | Discharge: 2024-03-05 | Disposition: A | Discharge status: Home / Self Care | Attending: Physician Assistant | Admitting: Physician Assistant

## 2024-03-05 DIAGNOSIS — K0889 Other specified disorders of teeth and supporting structures: Secondary | ICD-10-CM | POA: Diagnosis not present

## 2024-03-05 MED ORDER — AMOXICILLIN 500 MG PO CAPS: 500.0000 mg | ORAL_CAPSULE | Freq: Two times a day (BID) | ORAL | 0 refills | Status: DC

## 2024-03-05 NOTE — Discharge Instructions (Signed)
 Take antibiotic as prescribed Continue with Ibuprofen  as needed Can apply ice to affected area Follow up with dentist

## 2024-03-05 NOTE — ED Triage Notes (Signed)
 Patient presents to UC for right lower dental pain x 4 days ago. States her wisdom tooth is bothering her. Pain with chewing or biting down. Taking ibuprofen  for pain.

## 2024-03-05 NOTE — ED Provider Notes (Signed)
 [name]    CSN: [id number] Arrival date & time: 03/05/24  1526      History   Chief Complaint Chief Complaint  Patient presents with   Dental Pain    HPI Alison Brock is a 23 y.o. female.   Pt presents with right lower dental pain that started about four days ago.  Pt with known bilateral impacted wisdom teeth. She denies fever, chills, n/v.  She has been taking ibuprofen  with relief.  She does not currently have a dentist.     Past Medical History:  Diagnosis Date   Anxiety    Seizures (HCC)     Patient Active Problem List   Diagnosis Date Noted   Left foot pain 03/07/2022   Encounter for long-term current use of medication 11/18/2020   Pyelonephritis 06/02/2019   Migraine variants, not intractable 02/09/2019   Panic disorder 12/25/2018   Social anxiety disorder 10/06/2018   Frequent headaches 07/08/2016   Anxiety 06/26/2016   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 Authorizing Provider  amoxicillin  (AMOXIL ) 500 MG capsule Take 1 capsule (500 mg total) by mouth 2 (two) times daily for 7 days. 03/05/24 03/12/24 Yes Ward, Char Common, PA-C  amitriptyline  (ELAVIL ) 25 MG tablet TAKE TWO TABLETS BY MOUTH EVERY NIGHT AT BEDTIME 12/19/23   Lyndol Santee, NP  busPIRone  (BUSPAR ) 15 MG tablet Take 20 mg by mouth 2 (two) times daily. 12/15/18   [provider]  levETIRAcetam  (KEPPRA  XR) 750 MG 24 hr tablet TAKE 2 TABLETS  BY MOUTH EVERY MORNING AND TAKE TWO TABLETS BY MOUTH EVERY NIGHT AT BEDTIME 12/19/23   Lyndol Santee, NP  LORazepam  (ATIVAN ) 0.5 MG tablet Take 1/2 tablet 3 times per day for anxiety Patient taking differently: Take 3/4 tablet 3 times per day for anxiety 05/29/22   Lyndol Santee, NP  LOW-OGESTREL  0.3-30 MG-MCG tablet Take 1 tablet by mouth daily. 02/11/24   Nche, Connye Delaine, NP  Magnesium  Oxide 500 MG TABS Take 500 mg by mouth daily.     [provider]  pyridOXINE  (VITAMIN B-6) 100 MG tablet Take 100 mg by mouth daily.    [provider]  riboflavin  (VITAMIN B-2) 100 MG TABS tablet Take 100 mg by mouth daily.    [provider]    Family History Family History  Problem Relation Age of Onset   Hypertension Mother    Uterine cancer Mother    Drug abuse Father    Alcohol abuse Father    Bipolar disorder Maternal Aunt    Anxiety disorder Maternal Aunt    Hypertension Maternal Grandmother    Hypertension Paternal Grandfather  Depression Paternal Grandfather     Social History Social History   Tobacco Use   Smoking status: Every Day    Types: E-cigarettes    Start date: 08/20/2021    Passive exposure: Yes   Smokeless tobacco: Never  Vaping Use   Vaping status: Every Day   Start date: 10/21/2019   Substances: Nicotine   Devices: Vuse  Substance Use Topics   Alcohol use: No   Drug use: No     Allergies   Sulfamethoxazole-trimethoprim, Bentyl  [dicyclomine  hcl], Doxycycline, Grapefruit extract, Other, Topiramate , and Topiramate  er   Review of Systems Review of Systems  Constitutional:  Negative for chills and fever.  HENT:  Positive for dental problem. Negative for ear pain and sore throat.   Eyes:  Negative for pain and visual disturbance.  Respiratory:  Negative for cough and shortness of breath.   Cardiovascular:  Negative for chest pain and palpitations.  Gastrointestinal:  Negative for abdominal pain and vomiting.  Genitourinary:   Negative for dysuria and hematuria.  Musculoskeletal:  Negative for arthralgias and back pain.  Skin:  Negative for color change and rash.  Neurological:  Negative for seizures and syncope.  All other systems reviewed and are negative.    Physical Exam Triage Vital Signs ED Triage Vitals  Encounter Vitals Group     BP 03/05/24 1530 139/84     Systolic BP Percentile --      Diastolic BP Percentile --      Pulse Rate 03/05/24 1530 (!) 107     Resp 03/05/24 1530 20     Temp 03/05/24 1530 98.1 F (36.7 C)     Temp Source 03/05/24 1530 Oral     SpO2 03/05/24 1530 98 %     Weight --      Height 03/05/24 1530 5\' 3"  (1.6 m)     Head Circumference --      Peak Flow --      Pain Score 03/05/24 1536 0     Pain Loc --      Pain Education --      Exclude from Growth Chart --    No data found.  Updated Vital Signs BP 139/84 (BP Location: Right Arm)   Pulse (!) 107   Temp 98.1 F (36.7 C) (Oral)   Resp 20   Ht 5\' 3"  (1.6 m)   SpO2 98%   BMI 37.73 kg/m   Visual Acuity Right Eye Distance:   Left Eye Distance:   Bilateral Distance:    Right Eye Near:   Left Eye Near:    Bilateral Near:     Physical Exam Vitals and nursing note reviewed.  Constitutional:      General: She is not in acute distress.    Appearance: She is well-developed.  HENT:     Head: Normocephalic and atraumatic.     Comments: Mild swelling of the gums around right lower back molars.  Eyes:     Conjunctiva/sclera: Conjunctivae normal.  Cardiovascular:     Rate and Rhythm: Normal rate and regular rhythm.     Heart sounds: No murmur heard. Pulmonary:     Effort: Pulmonary effort is normal. No respiratory distress.     Breath sounds: Normal breath sounds.  Abdominal:     Palpations: Abdomen is soft.     Tenderness: There is no abdominal tenderness.  Musculoskeletal:        General: No swelling.     Cervical back: Neck supple.  Skin:  General: Skin is warm and dry.     Capillary Refill:  Capillary refill takes less than 2 seconds.  Neurological:     Mental Status: She is alert.  Psychiatric:        Mood and Affect: Mood normal.      UC Treatments / Results  Labs (all labs ordered are listed, but only abnormal results are displayed) Labs Reviewed - No data to display  EKG   Radiology No results found.  Procedures Procedures (including critical care time)  Medications Ordered in UC Medications - No data to display  Initial Impression / Assessment and Plan / UC Course  I have reviewed the triage vital signs and the nursing notes.  Pertinent labs & imaging results that were available during my care of the patient were reviewed by me and considered in my medical decision making (see chart for details).     Dental pain, will cover for infection with antibiotic.  Pt prefers amoxicillin  over augmentin.  Supportive care discussed. Dental follow up recommended.   Final Clinical Impressions(s) / UC Diagnoses   Final diagnoses:  Pain, dental     Discharge Instructions      Take antibiotic as prescribed Continue with Ibuprofen  as needed Can apply ice to affected area Follow up with dentist   ED Prescriptions     Medication Sig Dispense Auth. Provider   amoxicillin  (AMOXIL ) 500 MG capsule Take 1 capsule (500 mg total) by mouth 2 (two) times daily for 7 days. 14 capsule Ward, Shaneece Stockburger Z, PA-C      PDMP not reviewed this encounter.   Ward, Char Common, PA-C 03/05/24 1606

## 2024-03-07 ENCOUNTER — Other Ambulatory Visit (HOSPITAL_COMMUNITY)
Admission: RE | Admit: 2024-03-07 | Discharge: 2024-03-07 | Disposition: A | Discharge status: Home / Self Care | Source: Ambulatory Visit | Attending: Nurse Practitioner | Admitting: Nurse Practitioner

## 2024-03-07 ENCOUNTER — Ambulatory Visit (INDEPENDENT_AMBULATORY_CARE_PROVIDER_SITE_OTHER): Admitting: Nurse Practitioner

## 2024-03-07 ENCOUNTER — Encounter: Payer: Self-pay | Admitting: Nurse Practitioner

## 2024-03-07 VITALS — BP 134/80 | HR 110 | Temp 97.0°F | Ht 63.0 in | Wt 282.2 lb

## 2024-03-07 DIAGNOSIS — G40909 Epilepsy, unspecified, not intractable, without status epilepticus: Secondary | ICD-10-CM | POA: Diagnosis not present

## 2024-03-07 DIAGNOSIS — F4001 Agoraphobia with panic disorder: Secondary | ICD-10-CM | POA: Diagnosis not present

## 2024-03-07 DIAGNOSIS — Z3041 Encounter for surveillance of contraceptive pills: Secondary | ICD-10-CM

## 2024-03-07 DIAGNOSIS — Z124 Encounter for screening for malignant neoplasm of cervix: Secondary | ICD-10-CM

## 2024-03-07 DIAGNOSIS — Z0000 Encounter for general adult medical examination without abnormal findings: Secondary | ICD-10-CM

## 2024-03-07 DIAGNOSIS — L729 Follicular cyst of the skin and subcutaneous tissue, unspecified: Secondary | ICD-10-CM | POA: Diagnosis not present

## 2024-03-07 DIAGNOSIS — F331 Major depressive disorder, recurrent, moderate: Secondary | ICD-10-CM | POA: Diagnosis not present

## 2024-03-07 DIAGNOSIS — F4312 Post-traumatic stress disorder, chronic: Secondary | ICD-10-CM | POA: Diagnosis not present

## 2024-03-07 DIAGNOSIS — F411 Generalized anxiety disorder: Secondary | ICD-10-CM | POA: Diagnosis not present

## 2024-03-07 MED ORDER — LOW-OGESTREL 0.3-30 MG-MCG PO TABS: 1.0000 | ORAL_TABLET | Freq: Every day | ORAL | 3 refills | Status: DC

## 2024-03-07 NOTE — Progress Notes (Deleted)
                Established Patient Visit  Patient: Alison Brock   DOB: 2001-04-01   23 y.o. Female  MRN: [id number] Visit Date: 03/07/2024  Subjective:     Chief Complaint  Patient presents with   Annual Exam   Mass    Left arm no pain red in color    HPI No problem-specific Assessment & Plan notes found for this encounter.   Reviewed medical, surgical, and social history today  Medications: Outpatient Medications Prior to Visit  Medication Sig   amitriptyline  (ELAVIL ) 25 MG tablet TAKE TWO TABLETS BY MOUTH EVERY NIGHT AT BEDTIME (Patient taking differently: Take 50 mg by mouth. TAKE TWO TABLETS BY MOUTH EVERY NIGHT AT BEDTIME)   busPIRone  (BUSPAR ) 15 MG tablet Take 20 mg by mouth 3 (three) times daily.   levETIRAcetam  (KEPPRA  XR) 750 MG 24 hr tablet TAKE 2 TABLETS BY MOUTH EVERY MORNING AND TAKE TWO TABLETS BY MOUTH EVERY NIGHT AT BEDTIME   LORazepam  (ATIVAN ) 0.5 MG tablet Take 1/2 tablet 3 times per day for anxiety (Patient taking differently: Take 3/4 tablet 3 times per day for anxiety)   LOW-OGESTREL  0.3-30 MG-MCG tablet Take 1 tablet by mouth daily.   Magnesium  Oxide 500 MG TABS Take 500 mg by mouth daily.    pyridOXINE  (VITAMIN B-6) 100 MG tablet Take 100 mg by mouth daily.   riboflavin  (VITAMIN B-2) 100 MG TABS tablet Take 100 mg by mouth daily.   amoxicillin  (AMOXIL ) 500 MG capsule Take 1 capsule (500 mg total) by mouth 2 (two) times daily for 7 days. (Patient not taking: Reported on 03/07/2024)   No facility-administered medications prior to visit.   Reviewed past medical and social history.   ROS per HPI above  {Show previous labs (optional):23779}    Objective:  BP 134/80 (BP Location: Left Arm, Patient Position: Sitting, Cuff Size: Large)   Pulse (!) 110   Temp (!) 97 F (36.1 C) (Temporal)   Ht 5\' 3"  (1.6 m)   Wt 282 lb 3.2 oz (128 kg)   LMP 02/21/2024   SpO2 98%   BMI 49.99 kg/m      Physical Exam  No results found for any visits on 03/07/24.     Assessment & Plan:    Problem List Items Addressed This Visit   None  No follow-ups on file.     Kathrene Parents, NP

## 2024-03-07 NOTE — Patient Instructions (Signed)
 Schedule fasting lab appt. Need to be fasting 8hrs prior to blood draw. Ok to drink water and take BP meds. Maintain Heart healthy diet and daily exercise.

## 2024-03-07 NOTE — Addendum Note (Signed)
 Addended by: Lovey Rudd on: 03/07/2024 03:27 PM   Modules accepted: Orders

## 2024-03-07 NOTE — Progress Notes (Addendum)
 Complete physical exam  Patient: Alison Brock   DOB: 2001-04-04   23 y.o. Female  MRN: [id number] Visit Date: 03/07/2024  Subjective:     Chief Complaint  Patient presents with   Annual Exam   Mass    Left arm no pain red in color    Accompanied by Grandmother.  Alison Brock is a 23 y.o. female who presents today for a complete physical exam. She reports consuming a general diet. No exercise She generally feels well. She reports sleeping fairly well. She does have additional problems to discuss today.  Vision:No Dental:No STD Screen:No  BP Readings from Last 3 Encounters:  03/07/24 134/80  03/05/24 139/84  11/10/22 (!) 130/96   Wt Readings from Last 3 Encounters:  03/07/24 282 lb 3.2 oz (128 kg)  03/12/23 213 lb (96.6 kg)  10/07/22 217 lb (98.4 kg)    Most recent fall risk assessment:    03/07/2024    3:02 PM  Fall Risk   Falls in the past year? 0  Number falls in past yr: 0  Injury with Fall? 0  Risk for fall due to : No Fall Risks  Follow up Falls evaluation completed     Depression screen:Yes - Depression Most recent depression screenings:    03/07/2024    3:03 PM 11/18/2022    9:41 AM  PHQ 2/9 Scores  PHQ - 2 Score 4   PHQ- 9 Score 14      Information is confidential and restricted. Go to Review Flowsheets to unlock data.    HPI  Cyst of subcutaneous tissue Left deltoid: no erythema, no pain, no retraction, no induration   Past Medical History:  Diagnosis Date   Anxiety    Seizures (HCC)    Past Surgical History:  Procedure Laterality Date   GUM SURGERY  03/2016   Social History   Socioeconomic History   Marital status: Single    Spouse name: Not on file   Number of children: Not on file   Years of education: Not on file   Highest education level: 11th grade  Occupational History   Not on file  Tobacco Use   Smoking status: Every Day    Types: E-cigarettes    Start date: 08/20/2021    Passive exposure: Yes   Smokeless  tobacco: Never  Vaping Use   Vaping status: Every Day   Start date: 10/21/2019   Substances: Nicotine   Devices: Vuse  Substance and Sexual Activity   Alcohol use: No   Drug use: No   Sexual activity: Yes    Birth control/protection: Condom, Pill  Other Topics Concern   Not on file  Social History Narrative   Lives with her grandmother.   1 dog   [name] Vapes   Social Drivers of Health   Financial Resource Strain: High Risk (03/04/2024)   Overall Financial Resource Strain (CARDIA)    Difficulty of Paying Living Expenses: Very hard  Food Insecurity: No Food Insecurity (03/04/2024)   Hunger Vital Sign    Worried About Running Out of Food in the Last Year: Never true    Ran Out of Food in the Last Year: Never true  Transportation Needs: No Transportation Needs (03/04/2024)   PRAPARE - Administrator, Civil Service (Medical): No    Lack of Transportation (Non-Medical): No  Physical Activity: Unknown (03/04/2024)   Exercise Vital Sign    Days of Exercise per Week: 0 days    Minutes of  Exercise per Session: Not on file  Stress: Stress Concern Present (03/04/2024)   [employer] of Occupational Health - Occupational Stress Questionnaire    Feeling of Stress : Very much  Social Connections: Socially Isolated (03/04/2024)   Social Connection and Isolation Panel [NHANES]    Frequency of Communication with Friends and Family: More than three times a week    Frequency of Social Gatherings with Friends and Family: Once a week    Attends Religious Services: Never    Database administrator or Organizations: No    Attends Engineer, structural: Not on file    Marital Status: Never married  Intimate Partner Violence: Unknown (06/02/2019)   Humiliation, Afraid, Rape, and Kick questionnaire    Fear of Current or Ex-Partner: Patient declined    Emotionally Abused: Patient declined    Physically Abused: Patient declined    Sexually Abused: Patient declined   Family  Status  Relation Name Status   Mother  Alive   Father  Alive   Mat Aunt  Alive   MGM  Alive   MGF  Deceased   PGM  Alive   PGF  Alive  No partnership data on file   Family History  Problem Relation Age of Onset   Hypertension Mother    Uterine cancer Mother    Drug abuse Father    Alcohol abuse Father    Bipolar disorder Maternal Aunt    Anxiety disorder Maternal Aunt    Hypertension Maternal Grandmother    Hypertension Paternal Grandfather    Depression Paternal Grandfather    Allergies  Allergen Reactions   Sulfamethoxazole-Trimethoprim Nausea And Vomiting   Bentyl  [Dicyclomine  Hcl]    Doxycycline Other (See Comments)    Nausea and hallucination  doxycycline   Grapefruit Extract Other (See Comments)    Pt is not supposed to eat grapefruit with medication    Other     Seasonal Allergies - Spring and Fall   Topiramate  Other (See Comments)    Topamax    Topiramate  Er     Patient Care Team: Gibril Mastro, Connye Delaine, NP as PCP - General (Internal Medicine) Lyndol Santee, NP as Nurse Practitioner (Neurology) Gloriann Larger, DNP as Consulting Physician (Psychiatry) Revonda Castles, DNP (Psychiatry)   Medications: Outpatient Medications Prior to Visit  Medication Sig   amitriptyline  (ELAVIL ) 25 MG tablet TAKE TWO TABLETS BY MOUTH EVERY NIGHT AT BEDTIME (Patient taking differently: Take 50 mg by mouth. TAKE TWO TABLETS BY MOUTH EVERY NIGHT AT BEDTIME)   busPIRone  (BUSPAR ) 15 MG tablet Take 20 mg by mouth 3 (three) times daily.   levETIRAcetam  (KEPPRA  XR) 750 MG 24 hr tablet TAKE 2 TABLETS BY MOUTH EVERY MORNING AND TAKE TWO TABLETS BY MOUTH EVERY NIGHT AT BEDTIME   LORazepam  (ATIVAN ) 0.5 MG tablet Take 1/2 tablet 3 times per day for anxiety (Patient taking differently: Take 3/4 tablet 3 times per day for anxiety)   Magnesium  Oxide 500 MG TABS Take 500 mg by mouth daily.    pyridOXINE  (VITAMIN B-6) 100 MG tablet Take 100 mg by mouth daily.   riboflavin  (VITAMIN B-2) 100  MG TABS tablet Take 100 mg by mouth daily.   [DISCONTINUED] LOW-OGESTREL  0.3-30 MG-MCG tablet Take 1 tablet by mouth daily.   [DISCONTINUED] amoxicillin  (AMOXIL ) 500 MG capsule Take 1 capsule (500 mg total) by mouth 2 (two) times daily for 7 days. (Patient not taking: Reported on 03/07/2024)   No facility-administered medications prior to visit.    Review of  Systems  Constitutional:  Negative for activity change, appetite change and unexpected weight change.  Respiratory: Negative.    Cardiovascular: Negative.   Gastrointestinal: Negative.   Endocrine: Negative for cold intolerance and heat intolerance.  Genitourinary: Negative.   Musculoskeletal: Negative.   Skin: Negative.   Neurological: Negative.   Hematological: Negative.   Psychiatric/Behavioral:  Positive for agitation, decreased concentration and dysphoric mood. Negative for behavioral problems, hallucinations, self-injury, sleep disturbance and suicidal ideas. The patient is nervous/anxious.         Objective:  BP 134/80 (BP Location: Left Arm, Patient Position: Sitting, Cuff Size: Large)   Pulse (!) 110   Temp (!) 97 F (36.1 C) (Temporal)   Ht 5\' 3"  (1.6 m)   Wt 282 lb 3.2 oz (128 kg)   LMP 02/21/2024   SpO2 98%   BMI 49.99 kg/m     Physical Exam Vitals and nursing note reviewed. Exam conducted with a chaperone present.  Cardiovascular:     Rate and Rhythm: Normal rate.     Pulses: Normal pulses.  Pulmonary:     Effort: Pulmonary effort is normal.  Chest:     Comments: Declined breast exam Abdominal:     Hernia: There is no hernia in the left inguinal area or right inguinal area.  Genitourinary:    General: Normal vulva.     Exam position: Lithotomy position.     Labia:        Right: No rash or tenderness.        Left: No rash or tenderness.      Vagina: Bleeding present. No vaginal discharge, erythema or tenderness.     Cervix: Cervical bleeding and eversion present. No cervical motion tenderness,  discharge, friability, lesion or erythema.     Uterus: Normal.      Adnexa: Right adnexa normal and left adnexa normal.  Musculoskeletal:     Right lower leg: No edema.     Left lower leg: No edema.  Lymphadenopathy:     Lower Body: No right inguinal adenopathy. No left inguinal adenopathy.  Neurological:     Mental Status: She is alert and oriented to person, place, and time.     No results found for any visits on 03/07/24.    Assessment & Plan:    Routine Health Maintenance and Physical Exam  Immunization History  Administered Date(s) Administered   Dtap, Unspecified 11/15/2001, 01/18/2002, 03/21/2002, 02/28/2003, 10/14/2005   HIB, Unspecified 11/15/2001, 01/18/2002, 03/21/2002, 02/06/2003   Hep B, Unspecified 12/31/00, 10/11/2001, 06/06/2002   Influenza, Seasonal, Injecte, Preservative Fre 08/02/2009, 08/06/2011   Influenza,inj,Quad PF,6+ Mos 08/23/2012, 07/19/2014   Influenza-Unspecified 08/11/2003, 10/06/2003, 09/11/2004, 10/14/2005, 09/14/2006, 07/18/2008   MMR 02/06/2003, 10/14/2005   Meningococcal Conjugate 05/17/2014   PFIZER(Purple Top)SARS-COV-2 Vaccination 07/07/2020, 08/04/2020   Pneumococcal-Unspecified 11/15/2001, 01/18/2002, 03/21/2002, 09/13/2002   Polio, Unspecified 11/15/2001, 01/18/2002, 02/06/2003, 10/14/2005   Tdap 01/10/2013   Varicella 09/13/2002, 10/14/2005   Health Maintenance  Topic Date Due   CHLAMYDIA SCREENING  Never done   Cervical Cancer Screening (Pap smear)  Never done   DTaP/Tdap/Td (7 - Td or Tdap) 03/07/2025 (Originally 01/11/2023)   Pneumococcal Vaccine 62-40 Years old (1 of 2 - PCV) 03/07/2025 (Originally 09/09/2020)   HPV VACCINES (1 - 3-dose series) 03/07/2025 (Originally 09/09/2016)   Hepatitis C Screening  03/07/2025 (Originally 09/10/2019)   Meningococcal B Vaccine (1 of 2 - Standard) 03/07/2025 (Originally 09/09/2017)   HIV Screening  Completed   INFLUENZA VACCINE  Discontinued   COVID-19 Vaccine  Discontinued   Discussed  health benefits of physical activity, and encouraged her to engage in regular exercise appropriate for her age and condition.  Problem List Items Addressed This Visit     Cyst of subcutaneous tissue   Left deltoid: no erythema, no pain, no retraction, no induration      Seizure disorder (HCC)   Relevant Orders   Levetiracetam  level   Other Visit Diagnoses       Preventative health care    -  Primary   Relevant Orders   CBC   Comprehensive metabolic panel with GFR   Lipid panel   TSH     Encounter for Papanicolaou smear for cervical cancer screening       Relevant Orders   Cytology - PAP     Encounter for surveillance of contraceptive pills       Relevant Medications   LOW-OGESTREL  0.3-30 MG-MCG tablet      Return in about 1 year (around 03/07/2025) for CPE (fasting).     Kathrene Parents, NP

## 2024-03-07 NOTE — Assessment & Plan Note (Signed)
 Left deltoid: no erythema, no pain, no retraction, no induration

## 2024-03-08 ENCOUNTER — Other Ambulatory Visit: Payer: Self-pay | Admitting: Nurse Practitioner

## 2024-03-08 DIAGNOSIS — Z3041 Encounter for surveillance of contraceptive pills: Secondary | ICD-10-CM

## 2024-03-08 DIAGNOSIS — F331 Major depressive disorder, recurrent, moderate: Secondary | ICD-10-CM | POA: Diagnosis not present

## 2024-03-08 DIAGNOSIS — F411 Generalized anxiety disorder: Secondary | ICD-10-CM | POA: Diagnosis not present

## 2024-03-08 DIAGNOSIS — F410 Panic disorder [episodic paroxysmal anxiety] without agoraphobia: Secondary | ICD-10-CM | POA: Diagnosis not present

## 2024-03-08 NOTE — Telephone Encounter (Signed)
 Called requesting pharmacy and left a voice message asking to give me a call back at the office at [phone]. I will also try calling again

## 2024-03-08 NOTE — Telephone Encounter (Signed)
 Called patient's pharmacy and spoke withTiffany. I explained why I was calling. She stated that the medication was refilled on 03/07/24 and that their system must have sent a new request. She stated that she will go in and take it out. I thanked her for taking my call.

## 2024-03-10 ENCOUNTER — Other Ambulatory Visit

## 2024-03-10 ENCOUNTER — Ambulatory Visit: Payer: Self-pay | Admitting: Nurse Practitioner

## 2024-03-10 DIAGNOSIS — Z1159 Encounter for screening for other viral diseases: Secondary | ICD-10-CM

## 2024-03-10 LAB — CYTOLOGY - PAP
Comment: NEGATIVE
Diagnosis: UNDETERMINED — AB
High risk HPV: NEGATIVE

## 2024-03-11 ENCOUNTER — Telehealth: Payer: Self-pay

## 2024-03-11 ENCOUNTER — Other Ambulatory Visit

## 2024-03-11 DIAGNOSIS — F4001 Agoraphobia with panic disorder: Secondary | ICD-10-CM | POA: Diagnosis not present

## 2024-03-11 DIAGNOSIS — F331 Major depressive disorder, recurrent, moderate: Secondary | ICD-10-CM | POA: Diagnosis not present

## 2024-03-11 DIAGNOSIS — F411 Generalized anxiety disorder: Secondary | ICD-10-CM | POA: Diagnosis not present

## 2024-03-11 DIAGNOSIS — F4312 Post-traumatic stress disorder, chronic: Secondary | ICD-10-CM | POA: Diagnosis not present

## 2024-03-11 NOTE — Telephone Encounter (Signed)
 Copied from CRM [id number]. Topic: Appointments - Scheduling Inquiry for Clinic >> Mar 11, 2024  3:44 PM [name] wrote: Reason for CRM: Patient would like to do her Urine cytology ancillary(Order [id number]) during her upcoming lab appt on 03/16/2024 at 8:20a; please follow up with patient to let her know if she can or cannot do it during that time [phone]

## 2024-03-11 NOTE — Addendum Note (Signed)
 Addended by: Lovey Rudd on: 03/11/2024 03:33 PM   Modules accepted: Orders

## 2024-03-12 ENCOUNTER — Ambulatory Visit
Admission: EM | Admit: 2024-03-12 | Discharge: 2024-03-12 | Disposition: A | Discharge status: Home / Self Care | Attending: Internal Medicine | Admitting: Internal Medicine

## 2024-03-12 ENCOUNTER — Encounter: Payer: Self-pay | Admitting: Emergency Medicine

## 2024-03-12 DIAGNOSIS — H6123 Impacted cerumen, bilateral: Secondary | ICD-10-CM | POA: Diagnosis not present

## 2024-03-12 MED ORDER — FLUTICASONE PROPIONATE 50 MCG/ACT NA SUSP: 2.0000 | Freq: Every day | NASAL | 0 refills | Status: DC

## 2024-03-12 NOTE — ED Triage Notes (Signed)
 Pt c/o bilateral ear fullness. States she cleans out ears regularly. While cleaning ears today she had large piece come out. Since then she cannot hear out of left ear.

## 2024-03-12 NOTE — ED Provider Notes (Signed)
 [facility]    CSN: [id number] Arrival date & time: 03/12/24  1457      History   Chief Complaint Chief Complaint  Patient presents with   Ear Fullness    HPI Alison Brock is a 23 y.o. female.   Alison Brock is a 23 y.o. female presenting for chief complaint of Ear Fullness to the left ear that started a few days ago. She cleans out her ears with OTC wax removal drops and flushes once monthly. She did this to the left ear approximately 1 hour prior to arrival at urgent care and states her hearing from the left ear is now decreased after using the wax cleaning solution. She feels like there is water in the left ear canal that won't come out and is affecting her hearing. Right ear hearing is normal, however she suspects she may have a wax impaction to the right ear as well. Denies pain to the ears, fever, chills, nausea, vomiting, and viral URI symptoms. She frequently becomes dizzy when flushing the ears at home.    Ear Fullness    Past Medical History:  Diagnosis Date   Anxiety    Seizures [facility])     Patient Active Problem List   Diagnosis Date Noted   Cyst of subcutaneous tissue 03/07/2024   Left foot pain 03/07/2022   Encounter for long-term current use of medication 11/18/2020   Pyelonephritis 06/02/2019   Migraine variants, not intractable 02/09/2019   Panic disorder 12/25/2018   Social anxiety disorder 10/06/2018   Frequent headaches 07/08/2016   Anxiety 06/26/2016   Nonintractable juvenile myoclonic epilepsy without status epilepticus (HCC) 06/26/2016   Seizure disorder (HCC) 06/26/2016   AP (abdominal pain) 06/02/2016   Loss of weight 06/02/2016   Constipation 06/02/2016    Past Surgical History:  Procedure Laterality Date   GUM SURGERY  03/2016    OB History     Gravida  1   Para      Term      Preterm      AB      Living         SAB      IAB      Ectopic      Multiple      Live Births               Home  Medications    Prior to Admission medications   Medication Sig Start Date End Date Taking? Authorizing Provider  fluticasone  (FLONASE ) 50 MCG/ACT nasal spray Place 2 sprays into both nostrils daily. 03/12/24  Yes Kashia Brossard, Danny Dye, FNP  amitriptyline  (ELAVIL ) 25 MG tablet TAKE TWO TABLETS BY MOUTH EVERY NIGHT AT BEDTIME Patient taking differently: Take 50 mg by mouth. TAKE TWO TABLETS BY MOUTH EVERY NIGHT AT BEDTIME 12/19/23   Lyndol Santee, NP  busPIRone  (BUSPAR ) 15 MG tablet Take 20 mg by mouth 3 (three) times daily. 12/15/18   [provider]  levETIRAcetam  (KEPPRA  XR) 750 MG 24 hr tablet TAKE 2 TABLETS BY MOUTH EVERY MORNING AND TAKE TWO TABLETS BY MOUTH EVERY NIGHT AT BEDTIME 12/19/23   Lyndol Santee, NP  LORazepam  (ATIVAN ) 0.5 MG tablet Take 1/2 tablet 3 times per day for anxiety Patient taking differently: Take 3/4 tablet 3 times per day for anxiety 05/29/22   Lyndol Santee, NP  LOW-OGESTREL  0.3-30 MG-MCG tablet Take 1 tablet by mouth daily. 03/07/24   Nche, Connye Delaine, NP  Magnesium  Oxide 500 MG TABS Take 500 mg  by mouth daily.     [provider]  pyridOXINE  (VITAMIN B-6) 100 MG tablet Take 100 mg by mouth daily.    [provider]  riboflavin  (VITAMIN B-2) 100 MG TABS tablet Take 100 mg by mouth daily.    [provider]    Family History Family History  Problem Relation Age of Onset   Hypertension Mother    Uterine cancer Mother    Drug abuse Father    Alcohol abuse Father    Bipolar disorder Maternal Aunt    Anxiety disorder Maternal Aunt    Hypertension Maternal Grandmother    Hypertension Paternal Grandfather    Depression Paternal Grandfather     Social History Social History   Tobacco Use   Smoking status: Every Day    Types: E-cigarettes    Start date: 08/20/2021    Passive exposure: Yes   Smokeless tobacco: Never  Vaping Use   Vaping status: Every Day   Start date: 10/21/2019   Substances: Nicotine   Devices:  Vuse  Substance Use Topics   Alcohol use: No   Drug use: No     Allergies   Sulfamethoxazole-trimethoprim, Bentyl  [dicyclomine  hcl], Doxycycline, Grapefruit extract, Other, Topiramate , and Topiramate  er   Review of Systems Review of Systems Per HPI  Physical Exam Triage Vital Signs ED Triage Vitals  Encounter Vitals Group     BP 03/12/24 1514 (!) 161/76     Systolic BP Percentile --      Diastolic BP Percentile --      Pulse Rate 03/12/24 1511 (!) 122     Resp 03/12/24 1511 16     Temp --      Temp Source 03/12/24 1511 Oral     SpO2 03/12/24 1511 96 %     Weight --      Height --      Head Circumference --      Peak Flow --      Pain Score 03/12/24 1517 0     Pain Loc --      Pain Education --      Exclude from Growth Chart --    No data found.  Updated Vital Signs BP 135/83 (BP Location: Right Wrist)   Pulse (!) 103   Resp 16   LMP 02/21/2024   SpO2 98%   Visual Acuity Right Eye Distance:   Left Eye Distance:   Bilateral Distance:    Right Eye Near:   Left Eye Near:    Bilateral Near:     Physical Exam Vitals and nursing note reviewed.  Constitutional:      Appearance: She is not ill-appearing or toxic-appearing.  HENT:     Head: Normocephalic and atraumatic.     Right Ear: Hearing, tympanic membrane, ear canal and external ear normal. There is impacted cerumen.     Left Ear: Hearing, tympanic membrane, ear canal and external ear normal. There is impacted cerumen.     Nose: Nose normal.     Mouth/Throat:     Lips: Pink.     Mouth: Mucous membranes are moist. No injury or oral lesions.     Dentition: Normal dentition.     Tongue: No lesions.     Pharynx: Oropharynx is clear. Uvula midline. No pharyngeal swelling, oropharyngeal exudate, posterior oropharyngeal erythema, uvula swelling or postnasal drip.     Tonsils: No tonsillar exudate.  Eyes:     General: Lids are normal. Vision grossly intact. Gaze aligned appropriately.  Extraocular  Movements: Extraocular movements intact.     Conjunctiva/sclera: Conjunctivae normal.  Neck:     Trachea: Trachea and phonation normal.  Pulmonary:     Effort: Pulmonary effort is normal.  Musculoskeletal:     Cervical back: Neck supple.  Lymphadenopathy:     Cervical: No cervical adenopathy.  Skin:    General: Skin is warm and dry.     Capillary Refill: Capillary refill takes less than 2 seconds.     Findings: No rash.  Neurological:     General: No focal deficit present.     Mental Status: She is alert and oriented to person, place, and time. Mental status is at baseline.     Cranial Nerves: No dysarthria or facial asymmetry.  Psychiatric:        Mood and Affect: Mood normal.        Speech: Speech normal.        Behavior: Behavior normal.        Thought Content: Thought content normal.        Judgment: Judgment normal.      UC Treatments / Results  Labs (all labs ordered are listed, but only abnormal results are displayed) Labs Reviewed - No data to display  EKG   Radiology No results found.  Procedures Procedures (including critical care time)  Medications Ordered in UC Medications - No data to display  Initial Impression / Assessment and Plan / UC Course  I have reviewed the triage vital signs and the nursing notes.  Pertinent labs & imaging results that were available during my care of the patient were reviewed by me and considered in my medical decision making (see chart for details).   1. Bilateral hearing loss due to cerumen impaction Both ear(s) cleaned with ear lavage to remove ear wax impactions bilaterally by nursing staff. I was additionally able to remove some cerumen with a lighted curette.  Reassessment shows normal ear canals with some  Patient may use debrox ear drops at home as needed for wax removal and has been advised to avoid using Q-tips.   Flonase  ordered to be used 2 puffs into each nostril daily for eustachian tube dysfunction and  sensation of ear fullness that remains despite resolution of cerumen impaction.   Counseled patient on potential for adverse effects with medications prescribed/recommended today, strict ER and return-to-clinic precautions discussed, patient verbalized understanding.   Final Clinical Impressions(s) / UC Diagnoses   Final diagnoses:  Bilateral hearing loss due to cerumen impaction   Discharge Instructions   None    ED Prescriptions     Medication Sig Dispense Auth. Provider   fluticasone  (FLONASE ) 50 MCG/ACT nasal spray Place 2 sprays into both nostrils daily. 16 g Starlene Eaton, FNP      PDMP not reviewed this encounter.   Starlene Eaton, [place] 03/21/24 2059

## 2024-03-14 ENCOUNTER — Other Ambulatory Visit: Payer: Self-pay

## 2024-03-14 ENCOUNTER — Ambulatory Visit: Admission: EM | Admit: 2024-03-14 | Discharge: 2024-03-14 | Disposition: A | Discharge status: Home / Self Care

## 2024-03-14 DIAGNOSIS — H6121 Impacted cerumen, right ear: Secondary | ICD-10-CM

## 2024-03-14 DIAGNOSIS — F4312 Post-traumatic stress disorder, chronic: Secondary | ICD-10-CM | POA: Diagnosis not present

## 2024-03-14 DIAGNOSIS — H9191 Unspecified hearing loss, right ear: Secondary | ICD-10-CM

## 2024-03-14 DIAGNOSIS — F331 Major depressive disorder, recurrent, moderate: Secondary | ICD-10-CM | POA: Diagnosis not present

## 2024-03-14 DIAGNOSIS — F4001 Agoraphobia with panic disorder: Secondary | ICD-10-CM | POA: Diagnosis not present

## 2024-03-14 DIAGNOSIS — F411 Generalized anxiety disorder: Secondary | ICD-10-CM | POA: Diagnosis not present

## 2024-03-14 NOTE — ED Provider Notes (Signed)
 Alison Brock    CSN: [id number] Arrival date & time: 03/14/24  1109      History   Chief Complaint Chief Complaint  Patient presents with   Ear Fullness    HPI Alison Brock is a 23 y.o. female.   HPI  Patient presents today with concerns for ear fullness She reports that she was seen on Sat for ear fullness  A curette and lavage was used to assist with ear fullness. She had both ears cleaned out on Sat She reports her right ear still feels full and muffled     Past Medical History:  Diagnosis Date   Anxiety    Seizures (HCC)     Patient Active Problem List   Diagnosis Date Noted   Cyst of subcutaneous tissue 03/07/2024   Left foot pain 03/07/2022   Encounter for long-term current use of medication 11/18/2020   Pyelonephritis 06/02/2019   Migraine variants, not intractable 02/09/2019   Panic disorder 12/25/2018   Social anxiety disorder 10/06/2018   Frequent headaches 07/08/2016   Anxiety 06/26/2016   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 Authorizing Provider  amitriptyline  (ELAVIL ) 25 MG tablet TAKE TWO TABLETS BY MOUTH EVERY NIGHT AT BEDTIME Patient taking differently: Take 50 mg by mouth. TAKE TWO TABLETS BY MOUTH EVERY NIGHT AT BEDTIME 12/19/23   Lyndol Santee, NP  busPIRone  (BUSPAR ) 15 MG tablet Take 20 mg by mouth 3 (three) times daily. 12/15/18   [provider]  fluticasone (FLONASE) 50 MCG/ACT  nasal spray Place 2 sprays into both nostrils daily. 03/12/24   Starlene Eaton, FNP  levETIRAcetam  (KEPPRA  XR) 750 MG 24 hr tablet TAKE 2 TABLETS BY MOUTH EVERY MORNING AND TAKE TWO TABLETS BY MOUTH EVERY NIGHT AT BEDTIME 12/19/23   Lyndol Santee, NP  LORazepam  (ATIVAN ) 0.5 MG tablet Take 1/2 tablet 3 times per day for anxiety Patient taking differently: Take 3/4 tablet 3 times per day for anxiety 05/29/22   Lyndol Santee, NP  LOW-OGESTREL  0.3-30 MG-MCG tablet Take 1 tablet by mouth daily. 03/07/24   Nche, Connye Delaine, NP  Magnesium  Oxide 500 MG TABS Take 500 mg by mouth daily.     [provider]  pyridOXINE  (VITAMIN B-6) 100 MG tablet Take 100 mg by mouth daily.    [provider]  riboflavin  (VITAMIN B-2) 100 MG TABS tablet Take 100 mg by mouth daily.    [provider]    Family History Family History  Problem Relation Age of Onset   Hypertension Mother    Uterine cancer Mother    Drug abuse Father    Alcohol abuse Father  Bipolar disorder Maternal Aunt    Anxiety disorder Maternal Aunt    Hypertension Maternal Grandmother    Hypertension Paternal Grandfather    Depression Paternal Grandfather     Social History Social History   Tobacco Use   Smoking status: Every Day    Types: E-cigarettes    Start date: 08/20/2021    Passive exposure: Yes   Smokeless tobacco: Never  Vaping Use   Vaping status: Every Day   Start date: 10/21/2019   Substances: Nicotine   Devices: Vuse  Substance Use Topics   Alcohol use: No   Drug use: No     Allergies   Sulfamethoxazole-trimethoprim, Bentyl  [dicyclomine  hcl], Doxycycline, Grapefruit extract, Other, Topiramate , and Topiramate  er   Review of Systems Review of Systems  Constitutional:  Negative for chills and fever.  HENT:  Positive for ear pain (mild and intermittent). Negative for ear discharge.        Right sided ear fullness and muffled hearing Normal on the left       Physical  Exam Triage Vital Signs ED Triage Vitals  Encounter Vitals Group     BP 03/14/24 1128 113/83     Systolic BP Percentile --      Diastolic BP Percentile --      Pulse Rate 03/14/24 1128 (!) 101     Resp 03/14/24 1128 18     Temp 03/14/24 1128 98.3 F (36.8 C)     Temp Source 03/14/24 1128 Oral     SpO2 03/14/24 1128 98 %     Weight --      Height --      Head Circumference --      Peak Flow --      Pain Score 03/14/24 1129 2     Pain Loc --      Pain Education --      Exclude from Growth Chart --    No data found.  Updated Vital Signs BP 113/83 (BP Location: Right Arm)   Pulse (!) 101   Temp 98.3 F (36.8 C) (Oral)   Resp 18   LMP 02/21/2024 (Exact Date)   SpO2 98%   Visual Acuity Right Eye Distance:   Left Eye Distance:   Bilateral Distance:    Right Eye Near:   Left Eye Near:    Bilateral Near:     Physical Exam Vitals reviewed.  Constitutional:      General: She is awake. She is not in acute distress.    Appearance: Normal appearance. She is well-developed and well-groomed. She is not ill-appearing or toxic-appearing.  HENT:     Head: Normocephalic and atraumatic.     Right Ear: Ear canal normal. Decreased hearing noted. There is impacted cerumen.     Left Ear: Hearing, tympanic membrane and ear canal normal. No decreased hearing noted.     Ears:     Comments: Patient has mild amount of impacted cerumen further in the ear canal.  She also has a mild/scant amount of blood in the canal that appears dried.  No signs of erythema, swelling, maceration to the canal. Eyes:     General: Lids are normal. Gaze aligned appropriately.     Extraocular Movements: Extraocular movements intact.     Conjunctiva/sclera: Conjunctivae normal.  Pulmonary:     Effort: Pulmonary effort is normal.  Neurological:     General: No focal deficit present.     Mental Status: She is alert and oriented to person, place, and  time.     GCS: GCS eye subscore is 4. GCS verbal subscore is  5. GCS motor subscore is 6.     Cranial Nerves: No cranial nerve deficit, dysarthria or facial asymmetry.  Psychiatric:        Attention and Perception: Attention and perception normal.        Mood and Affect: Mood and affect normal.        Speech: Speech normal.        Behavior: Behavior normal. Behavior is cooperative.        Thought Content: Thought content normal.      Brock Treatments / Results  Labs (all labs ordered are listed, but only abnormal results are displayed) Labs Reviewed - No data to display  EKG   Radiology No results found.  Procedures Procedures (including critical care time)  Medications Ordered in Brock Medications - No data to display  Initial Impression / Assessment and Plan / Brock Course  I have reviewed the triage vital signs and the nursing notes.  Pertinent labs & imaging results that were available during my care of the patient were reviewed by me and considered in my medical decision making (see chart for details).      Final Clinical Impressions(s) / Brock Diagnoses   Final diagnoses:  Impacted cerumen of right ear  Hearing decreased, right   Patient presents today with concerns of ear fullness and decreased hearing particularly on the right side.  She reports that she was seen here on Saturday for the same concern on the left side and she underwent ear lavage and wax removal with a curette.  She reports that after the right ear was worked on she started to have fullness in that ear.  At home she has tried lavage as well but this has not provided adequate relief.  Physical exam is notable for mild amount of scant blood in the right ear canal but no erythema, swelling, redness indicating otitis externa.  She does still have a small amount of wax which appears to be jetted up against the tympanic membrane.  Recommend over-the-counter ear removal drops and ear lavage at home as patient reports discomfort with having this procedure done in the office.  ED  and return precautions reviewed and provided after visit summary.  Follow-up as needed.    Discharge Instructions      You were seen today for concerns of ear fullness and decreased hearing particularly on the right side.  Your physical exam shows that there is a small amount of wax still in your ear canal but there does not appear to be swelling or an infection of the canal itself.  At this time I recommend earwax removal drops and at home ear lavage to assist with your symptoms.  Please do not insert anything into your ear such as a Q-tip or curette as these could cause ear damage or potential puncture to your eardrum.  If at any point you start to have more severe discomfort, pain, hearing loss, swelling around the ear please return here or go to your primary care provider for further evaluation and management.   ED Prescriptions   None    PDMP not reviewed this encounter.   Cristiana Yochim, Pearla Bottom, PA-C 03/14/24 1230

## 2024-03-14 NOTE — Discharge Instructions (Signed)
 You were seen today for concerns of ear fullness and decreased hearing particularly on the right side.  Your physical exam shows that there is a small amount of wax still in your ear canal but there does not appear to be swelling or an infection of the canal itself.  At this time I recommend earwax removal drops and at home ear lavage to assist with your symptoms.  Please do not insert anything into your ear such as a Q-tip or curette as these could cause ear damage or potential puncture to your eardrum.  If at any point you start to have more severe discomfort, pain, hearing loss, swelling around the ear please return here or go to your primary care provider for further evaluation and management.

## 2024-03-14 NOTE — ED Triage Notes (Signed)
 Pt presents with complaints of right ear fullness x 2 days. Pt states she was seen here on Saturday 5/24. Reports provider used a curette, hearing became muffled directly after in right ear. Left ear symptoms have now resolved with Flonase, chewing gum, and laying on side. Pt currently rates her overall pain a 2/10, more of a discomfort.

## 2024-03-16 ENCOUNTER — Encounter: Payer: Self-pay | Admitting: Nurse Practitioner

## 2024-03-16 ENCOUNTER — Other Ambulatory Visit

## 2024-03-17 ENCOUNTER — Encounter: Payer: Self-pay | Admitting: Emergency Medicine

## 2024-03-17 ENCOUNTER — Ambulatory Visit
Admission: EM | Admit: 2024-03-17 | Discharge: 2024-03-17 | Disposition: A | Discharge status: Home / Self Care | Attending: Emergency Medicine | Admitting: Emergency Medicine

## 2024-03-17 DIAGNOSIS — N898 Other specified noninflammatory disorders of vagina: Secondary | ICD-10-CM | POA: Diagnosis not present

## 2024-03-17 DIAGNOSIS — F331 Major depressive disorder, recurrent, moderate: Secondary | ICD-10-CM | POA: Diagnosis not present

## 2024-03-17 DIAGNOSIS — R300 Dysuria: Secondary | ICD-10-CM | POA: Insufficient documentation

## 2024-03-17 DIAGNOSIS — F4001 Agoraphobia with panic disorder: Secondary | ICD-10-CM | POA: Diagnosis not present

## 2024-03-17 DIAGNOSIS — F411 Generalized anxiety disorder: Secondary | ICD-10-CM | POA: Diagnosis not present

## 2024-03-17 DIAGNOSIS — F4312 Post-traumatic stress disorder, chronic: Secondary | ICD-10-CM | POA: Diagnosis not present

## 2024-03-17 LAB — POCT URINALYSIS DIP (MANUAL ENTRY)
Bilirubin, UA: NEGATIVE
Glucose, UA: NEGATIVE mg/dL
Ketones, POC UA: NEGATIVE mg/dL
Nitrite, UA: NEGATIVE
Protein Ur, POC: NEGATIVE mg/dL
Spec Grav, UA: 1.015 (ref 1.010–1.025)
Urobilinogen, UA: 0.2 U/dL
pH, UA: 7 (ref 5.0–8.0)

## 2024-03-17 LAB — POCT URINE PREGNANCY: Preg Test, Ur: NEGATIVE

## 2024-03-17 NOTE — ED Triage Notes (Signed)
 Pt c/o vaginal burning and discomfort for 2 days.

## 2024-03-17 NOTE — Discharge Instructions (Signed)
 Your urine had some abnormalities, but this could be related to your vaginal bleeding.  We are sending this for culture to ensure no urinary tract infection.  The cytology swab you did today will check for bacterial vaginosis, yeast vaginitis, gonorrhea, chlamydia and trichomoniasis.  Results will be back over the next few days and you will be contacted if the results are abnormal to start the appropriate treatment.  Abstain from intercourse until all results have been received.  Return to clinic or follow-up with your primary care provider for any new or concerning symptoms.

## 2024-03-17 NOTE — ED Provider Notes (Signed)
 [name]    CSN: [id number] Arrival date & time: 03/17/24  1452      History   Chief Complaint No chief complaint on file.   HPI Alison Brock is a 23 y.o. female.   Patient presents to clinic over concern of vaginal burning and discomfort for the past 2 days.  She did have a Pap smear that was abnormal on 5/19, showed ASCUS.  Since the Pap smear she has been having spotting.  After intercourse with her significant other she uses lubricant and toys, noticed that the last time she used the lubricant in the toilet she was very raw and irritated.  Has not had any dysuria.  Reports vaginal discomfort.  Denies abdominal pain, pelvic pain, nausea, vomiting or flank pain.  PCP had advised returning to clinic for sexually transmitted infection screening, patient has not completed this yet.  The history is provided by the patient and medical records.    Past Medical History:  Diagnosis Date   Anxiety    Seizures [facility])     Patient Active Problem List   Diagnosis Date Noted   Cyst of subcutaneous tissue 03/07/2024   Left foot pain 03/07/2022   Encounter for long-term current use of medication 11/18/2020   Pyelonephritis 06/02/2019   Migraine variants, not intractable 02/09/2019   Panic disorder 12/25/2018   Social anxiety disorder 10/06/2018   Frequent headaches 07/08/2016   Anxiety 06/26/2016   Nonintractable juvenile myoclonic epilepsy without status epilepticus (HCC) 06/26/2016   Seizure disorder (HCC) 06/26/2016   AP (abdominal pain) 06/02/2016   Loss of weight 06/02/2016   Constipation 06/02/2016    Past Surgical History:  Procedure Laterality Date   GUM SURGERY  03/2016    OB History     Gravida  1   Para      Term      Preterm      AB      Living         SAB      IAB      Ectopic      Multiple      Live Births               Home Medications    Prior to Admission medications   Medication Sig Start Date End Date  Taking? Authorizing Provider  amitriptyline  (ELAVIL ) 25 MG tablet TAKE TWO TABLETS BY MOUTH EVERY NIGHT AT BEDTIME Patient taking differently: Take 50 mg by mouth. TAKE TWO TABLETS BY MOUTH EVERY NIGHT AT BEDTIME 12/19/23   Lyndol Santee, NP  busPIRone  (BUSPAR ) 15 MG tablet Take 20 mg by mouth 3 (three) times daily. 12/15/18   [provider]  fluticasone (FLONASE) 50 MCG/ACT nasal spray Place 2 sprays into both nostrils daily. 03/12/24   Starlene Eaton, FNP  levETIRAcetam  (KEPPRA  XR) 750 MG 24 hr tablet TAKE 2 TABLETS BY MOUTH EVERY MORNING AND TAKE TWO TABLETS BY MOUTH EVERY NIGHT AT BEDTIME 12/19/23   Lyndol Santee, NP  LORazepam  (ATIVAN ) 0.5 MG tablet Take 1/2 tablet 3 times per day for anxiety Patient taking differently: Take 3/4 tablet 3 times per day for anxiety 05/29/22   Lyndol Santee, NP  LOW-OGESTREL  0.3-30 MG-MCG tablet Take 1 tablet by mouth daily. 03/07/24   Nche, Connye Delaine, NP  Magnesium  Oxide 500 MG TABS Take 500 mg by mouth daily.     [provider]  pyridOXINE  (VITAMIN B-6) 100 MG tablet Take 100 mg by mouth daily.  [provider]  riboflavin  (VITAMIN B-2) 100 MG TABS tablet Take 100 mg by mouth daily.    [provider]    Family History Family History  Problem Relation Age of Onset   Hypertension Mother    Uterine cancer Mother    Drug abuse Father    Alcohol abuse Father    Bipolar disorder Maternal Aunt    Anxiety disorder Maternal Aunt    Hypertension Maternal Grandmother    Hypertension Paternal Grandfather    Depression Paternal Grandfather     Social History Social History   Tobacco Use   Smoking status: Every Day    Types: E-cigarettes    Start date: 08/20/2021    Passive exposure: Yes   Smokeless tobacco: Never  Vaping Use   Vaping status: Every Day   Start date: 10/21/2019   Substances: Nicotine   Devices: Vuse  Substance Use Topics   Alcohol use: No   Drug use: No     Allergies    Sulfamethoxazole-trimethoprim, Bentyl  [dicyclomine  hcl], Doxycycline, Grapefruit extract, Other, Topiramate , and Topiramate  er   Review of Systems Review of Systems  Per HPI  Physical Exam Triage Vital Signs ED Triage Vitals [03/17/24 1547]  Encounter Vitals Group     BP      Systolic BP Percentile      Diastolic BP Percentile      Pulse      Resp      Temp      Temp src      SpO2      Weight      Height      Head Circumference      Peak Flow      Pain Score 0     Pain Loc      Pain Education      Exclude from Growth Chart    No data found.  Updated Vital Signs Pulse (!) 125   Resp 16   LMP 02/21/2024 (Exact Date)   SpO2 99%   Visual Acuity Right Eye Distance:   Left Eye Distance:   Bilateral Distance:    Right Eye Near:   Left Eye Near:    Bilateral Near:     Physical Exam Vitals and nursing note reviewed.  Constitutional:      Appearance: Normal appearance.  HENT:     Head: Normocephalic and atraumatic.     Right Ear: External ear normal.     Left Ear: External ear normal.     Nose: Nose normal.     Mouth/Throat:     Mouth: Mucous membranes are moist.  Eyes:     Conjunctiva/sclera: Conjunctivae normal.  Neurological:     General: No focal deficit present.     Mental Status: She is alert.  Psychiatric:        Behavior: Behavior is cooperative.      UC Treatments / Results  Labs (all labs ordered are listed, but only abnormal results are displayed) Labs Reviewed  POCT URINALYSIS DIP (MANUAL ENTRY) - Abnormal; Notable for the following components:      Result Value   Blood, UA moderate (*)    Leukocytes, UA Small (1+) (*)    All other components within normal limits  URINE CULTURE  POCT URINE PREGNANCY  CERVICOVAGINAL ANCILLARY ONLY    EKG   Radiology No results found.  Procedures Procedures (including critical care time)  Medications Ordered in UC Medications - No data to display  Initial Impression /  Assessment and Plan  / UC Course  I have reviewed the triage vital signs and the nursing notes.  Pertinent labs & imaging results that were available during my care of the patient were reviewed by me and considered in my medical decision making (see chart for details).  Vitals in triage reviewed, patient is hemodynamic stable.  She is tachycardic, overall presentation is anxious, with history of same.  Brought with her a stuffed animal, a fan, cool rag, frozen water bottle and her mother.  Without dysuria or urinary symptoms.  Urinalysis shows moderate red blood cells and small leukocytes, could be from vaginal bleeding.  Will send for culture.  Cytology swab obtained to rule out vaginal infections.  Urine pregnancy negative.  Staff will contact if treatment is needed based on swab and culture results.  Plan of care, follow-up care return precautions given, no questions at this time.     Final Clinical Impressions(s) / UC Diagnoses   Final diagnoses:  Dysuria  Vaginal discharge     Discharge Instructions      Your urine had some abnormalities, but this could be related to your vaginal bleeding.  We are sending this for culture to ensure no urinary tract infection.  The cytology swab you did today will check for bacterial vaginosis, yeast vaginitis, gonorrhea, chlamydia and trichomoniasis.  Results will be back over the next few days and you will be contacted if the results are abnormal to start the appropriate treatment.  Abstain from intercourse until all results have been received.  Return to clinic or follow-up with your primary care provider for any new or concerning symptoms.   ED Prescriptions   None    PDMP not reviewed this encounter.   Harlow Lighter, Rayen Dafoe  N, FNP 03/17/24 1559

## 2024-03-18 ENCOUNTER — Ambulatory Visit: Payer: Self-pay

## 2024-03-18 LAB — CERVICOVAGINAL ANCILLARY ONLY
Bacterial Vaginitis (gardnerella): NEGATIVE
Candida Glabrata: NEGATIVE
Candida Vaginitis: POSITIVE — AB
Chlamydia: NEGATIVE
Comment: NEGATIVE
Comment: NEGATIVE
Comment: NEGATIVE
Comment: NEGATIVE
Comment: NEGATIVE
Comment: NORMAL
Neisseria Gonorrhea: NEGATIVE
Trichomonas: NEGATIVE

## 2024-03-18 LAB — URINE CULTURE: Culture: NO GROWTH

## 2024-03-18 MED ORDER — FLUCONAZOLE 150 MG PO TABS: 150.0000 mg | ORAL_TABLET | Freq: Once | ORAL | 0 refills | Status: AC

## 2024-03-21 DIAGNOSIS — F4001 Agoraphobia with panic disorder: Secondary | ICD-10-CM | POA: Diagnosis not present

## 2024-03-21 DIAGNOSIS — F4312 Post-traumatic stress disorder, chronic: Secondary | ICD-10-CM | POA: Diagnosis not present

## 2024-03-21 DIAGNOSIS — F331 Major depressive disorder, recurrent, moderate: Secondary | ICD-10-CM | POA: Diagnosis not present

## 2024-03-21 DIAGNOSIS — F411 Generalized anxiety disorder: Secondary | ICD-10-CM | POA: Diagnosis not present

## 2024-03-22 DIAGNOSIS — F4001 Agoraphobia with panic disorder: Secondary | ICD-10-CM | POA: Diagnosis not present

## 2024-03-22 DIAGNOSIS — F331 Major depressive disorder, recurrent, moderate: Secondary | ICD-10-CM | POA: Diagnosis not present

## 2024-03-24 DIAGNOSIS — F4001 Agoraphobia with panic disorder: Secondary | ICD-10-CM | POA: Diagnosis not present

## 2024-03-24 DIAGNOSIS — F331 Major depressive disorder, recurrent, moderate: Secondary | ICD-10-CM | POA: Diagnosis not present

## 2024-03-24 DIAGNOSIS — F411 Generalized anxiety disorder: Secondary | ICD-10-CM | POA: Diagnosis not present

## 2024-03-24 DIAGNOSIS — F4312 Post-traumatic stress disorder, chronic: Secondary | ICD-10-CM | POA: Diagnosis not present

## 2024-03-28 DIAGNOSIS — F411 Generalized anxiety disorder: Secondary | ICD-10-CM | POA: Diagnosis not present

## 2024-03-28 DIAGNOSIS — F331 Major depressive disorder, recurrent, moderate: Secondary | ICD-10-CM | POA: Diagnosis not present

## 2024-03-28 DIAGNOSIS — F4001 Agoraphobia with panic disorder: Secondary | ICD-10-CM | POA: Diagnosis not present

## 2024-03-28 DIAGNOSIS — F4312 Post-traumatic stress disorder, chronic: Secondary | ICD-10-CM | POA: Diagnosis not present

## 2024-03-31 DIAGNOSIS — F4001 Agoraphobia with panic disorder: Secondary | ICD-10-CM | POA: Diagnosis not present

## 2024-03-31 DIAGNOSIS — F331 Major depressive disorder, recurrent, moderate: Secondary | ICD-10-CM | POA: Diagnosis not present

## 2024-03-31 DIAGNOSIS — F4312 Post-traumatic stress disorder, chronic: Secondary | ICD-10-CM | POA: Diagnosis not present

## 2024-03-31 DIAGNOSIS — F411 Generalized anxiety disorder: Secondary | ICD-10-CM | POA: Diagnosis not present

## 2024-04-04 DIAGNOSIS — F411 Generalized anxiety disorder: Secondary | ICD-10-CM | POA: Diagnosis not present

## 2024-04-04 DIAGNOSIS — F4001 Agoraphobia with panic disorder: Secondary | ICD-10-CM | POA: Diagnosis not present

## 2024-04-04 DIAGNOSIS — F4312 Post-traumatic stress disorder, chronic: Secondary | ICD-10-CM | POA: Diagnosis not present

## 2024-04-04 DIAGNOSIS — F331 Major depressive disorder, recurrent, moderate: Secondary | ICD-10-CM | POA: Diagnosis not present

## 2024-04-11 DIAGNOSIS — F4001 Agoraphobia with panic disorder: Secondary | ICD-10-CM | POA: Diagnosis not present

## 2024-04-11 DIAGNOSIS — F411 Generalized anxiety disorder: Secondary | ICD-10-CM | POA: Diagnosis not present

## 2024-04-11 DIAGNOSIS — F331 Major depressive disorder, recurrent, moderate: Secondary | ICD-10-CM | POA: Diagnosis not present

## 2024-04-11 DIAGNOSIS — F4312 Post-traumatic stress disorder, chronic: Secondary | ICD-10-CM | POA: Diagnosis not present

## 2024-04-14 DIAGNOSIS — F4001 Agoraphobia with panic disorder: Secondary | ICD-10-CM | POA: Diagnosis not present

## 2024-04-14 DIAGNOSIS — F331 Major depressive disorder, recurrent, moderate: Secondary | ICD-10-CM | POA: Diagnosis not present

## 2024-04-14 DIAGNOSIS — F4312 Post-traumatic stress disorder, chronic: Secondary | ICD-10-CM | POA: Diagnosis not present

## 2024-04-14 DIAGNOSIS — F411 Generalized anxiety disorder: Secondary | ICD-10-CM | POA: Diagnosis not present

## 2024-04-18 DIAGNOSIS — F4312 Post-traumatic stress disorder, chronic: Secondary | ICD-10-CM | POA: Diagnosis not present

## 2024-04-18 DIAGNOSIS — F4001 Agoraphobia with panic disorder: Secondary | ICD-10-CM | POA: Diagnosis not present

## 2024-04-18 DIAGNOSIS — F331 Major depressive disorder, recurrent, moderate: Secondary | ICD-10-CM | POA: Diagnosis not present

## 2024-04-18 DIAGNOSIS — F411 Generalized anxiety disorder: Secondary | ICD-10-CM | POA: Diagnosis not present

## 2024-04-19 DIAGNOSIS — F331 Major depressive disorder, recurrent, moderate: Secondary | ICD-10-CM | POA: Diagnosis not present

## 2024-04-19 DIAGNOSIS — F4001 Agoraphobia with panic disorder: Secondary | ICD-10-CM | POA: Diagnosis not present

## 2024-04-21 DIAGNOSIS — F4312 Post-traumatic stress disorder, chronic: Secondary | ICD-10-CM | POA: Diagnosis not present

## 2024-04-21 DIAGNOSIS — F4001 Agoraphobia with panic disorder: Secondary | ICD-10-CM | POA: Diagnosis not present

## 2024-04-21 DIAGNOSIS — F411 Generalized anxiety disorder: Secondary | ICD-10-CM | POA: Diagnosis not present

## 2024-04-21 DIAGNOSIS — F331 Major depressive disorder, recurrent, moderate: Secondary | ICD-10-CM | POA: Diagnosis not present

## 2024-04-25 DIAGNOSIS — F4312 Post-traumatic stress disorder, chronic: Secondary | ICD-10-CM | POA: Diagnosis not present

## 2024-04-25 DIAGNOSIS — F331 Major depressive disorder, recurrent, moderate: Secondary | ICD-10-CM | POA: Diagnosis not present

## 2024-04-25 DIAGNOSIS — F411 Generalized anxiety disorder: Secondary | ICD-10-CM | POA: Diagnosis not present

## 2024-04-25 DIAGNOSIS — F4001 Agoraphobia with panic disorder: Secondary | ICD-10-CM | POA: Diagnosis not present

## 2024-04-28 DIAGNOSIS — F4312 Post-traumatic stress disorder, chronic: Secondary | ICD-10-CM | POA: Diagnosis not present

## 2024-04-28 DIAGNOSIS — F4001 Agoraphobia with panic disorder: Secondary | ICD-10-CM | POA: Diagnosis not present

## 2024-04-28 DIAGNOSIS — F331 Major depressive disorder, recurrent, moderate: Secondary | ICD-10-CM | POA: Diagnosis not present

## 2024-04-28 DIAGNOSIS — F411 Generalized anxiety disorder: Secondary | ICD-10-CM | POA: Diagnosis not present

## 2024-05-02 DIAGNOSIS — F331 Major depressive disorder, recurrent, moderate: Secondary | ICD-10-CM | POA: Diagnosis not present

## 2024-05-02 DIAGNOSIS — F411 Generalized anxiety disorder: Secondary | ICD-10-CM | POA: Diagnosis not present

## 2024-05-02 DIAGNOSIS — F4001 Agoraphobia with panic disorder: Secondary | ICD-10-CM | POA: Diagnosis not present

## 2024-05-02 DIAGNOSIS — F4312 Post-traumatic stress disorder, chronic: Secondary | ICD-10-CM | POA: Diagnosis not present

## 2024-05-05 ENCOUNTER — Telehealth: Payer: Self-pay | Admitting: Nurse Practitioner

## 2024-05-05 DIAGNOSIS — F4001 Agoraphobia with panic disorder: Secondary | ICD-10-CM | POA: Diagnosis not present

## 2024-05-05 DIAGNOSIS — F411 Generalized anxiety disorder: Secondary | ICD-10-CM | POA: Diagnosis not present

## 2024-05-05 DIAGNOSIS — F331 Major depressive disorder, recurrent, moderate: Secondary | ICD-10-CM | POA: Diagnosis not present

## 2024-05-05 DIAGNOSIS — F4312 Post-traumatic stress disorder, chronic: Secondary | ICD-10-CM | POA: Diagnosis not present

## 2024-05-05 NOTE — Telephone Encounter (Signed)
 Patient dropped off document FMLA, to be filled out by provider. Patient requested to send it back via Fax within ASAP. Document is located in providers tray at front office.Please advise at [phone], forms were mailed in.

## 2024-05-05 NOTE — Telephone Encounter (Signed)
 Called patient to confirm that she wants us  to fax/mail forms back to [employer]. She stated that she would like for me to give her a call to come pick up a copy once completed. I informed her that once completed I will be giving her a call letting her know that I faxed the forms and that she may come pick them up when she is available to. She thanked me for calling and all (if any) questions were answered.

## 2024-05-09 DIAGNOSIS — F411 Generalized anxiety disorder: Secondary | ICD-10-CM | POA: Diagnosis not present

## 2024-05-09 DIAGNOSIS — F4001 Agoraphobia with panic disorder: Secondary | ICD-10-CM | POA: Diagnosis not present

## 2024-05-09 DIAGNOSIS — F331 Major depressive disorder, recurrent, moderate: Secondary | ICD-10-CM | POA: Diagnosis not present

## 2024-05-09 DIAGNOSIS — F4312 Post-traumatic stress disorder, chronic: Secondary | ICD-10-CM | POA: Diagnosis not present

## 2024-05-10 ENCOUNTER — Telehealth (INDEPENDENT_AMBULATORY_CARE_PROVIDER_SITE_OTHER): Payer: Self-pay | Admitting: Family

## 2024-05-10 NOTE — Telephone Encounter (Signed)
 Alison Brock called with concern about dental appointment today. She said that she will likely require numbing agent for 2 fillings and that the dentist warned her that there were potential interactions between Amitriptyline  and numbing agents. She was told that it was risk of high blood pressure. I told her that there was documented potential interaction but that it was unlikely to occur, and that anxiety could also raise BP. Alison Brock agreed and says that she plans to take Lorazepam  prior to the dental visit.

## 2024-05-12 DIAGNOSIS — F331 Major depressive disorder, recurrent, moderate: Secondary | ICD-10-CM | POA: Diagnosis not present

## 2024-05-12 DIAGNOSIS — F4312 Post-traumatic stress disorder, chronic: Secondary | ICD-10-CM | POA: Diagnosis not present

## 2024-05-12 DIAGNOSIS — F4001 Agoraphobia with panic disorder: Secondary | ICD-10-CM | POA: Diagnosis not present

## 2024-05-12 DIAGNOSIS — F411 Generalized anxiety disorder: Secondary | ICD-10-CM | POA: Diagnosis not present

## 2024-05-16 DIAGNOSIS — F331 Major depressive disorder, recurrent, moderate: Secondary | ICD-10-CM | POA: Diagnosis not present

## 2024-05-16 DIAGNOSIS — F4001 Agoraphobia with panic disorder: Secondary | ICD-10-CM | POA: Diagnosis not present

## 2024-05-16 DIAGNOSIS — F4312 Post-traumatic stress disorder, chronic: Secondary | ICD-10-CM | POA: Diagnosis not present

## 2024-05-16 DIAGNOSIS — F411 Generalized anxiety disorder: Secondary | ICD-10-CM | POA: Diagnosis not present

## 2024-05-19 DIAGNOSIS — F331 Major depressive disorder, recurrent, moderate: Secondary | ICD-10-CM | POA: Diagnosis not present

## 2024-05-19 DIAGNOSIS — F4001 Agoraphobia with panic disorder: Secondary | ICD-10-CM | POA: Diagnosis not present

## 2024-05-19 DIAGNOSIS — F411 Generalized anxiety disorder: Secondary | ICD-10-CM | POA: Diagnosis not present

## 2024-05-19 DIAGNOSIS — F4312 Post-traumatic stress disorder, chronic: Secondary | ICD-10-CM | POA: Diagnosis not present

## 2024-05-23 DIAGNOSIS — F4001 Agoraphobia with panic disorder: Secondary | ICD-10-CM | POA: Diagnosis not present

## 2024-05-23 DIAGNOSIS — F4312 Post-traumatic stress disorder, chronic: Secondary | ICD-10-CM | POA: Diagnosis not present

## 2024-05-23 DIAGNOSIS — F411 Generalized anxiety disorder: Secondary | ICD-10-CM | POA: Diagnosis not present

## 2024-05-23 DIAGNOSIS — F331 Major depressive disorder, recurrent, moderate: Secondary | ICD-10-CM | POA: Diagnosis not present

## 2024-05-26 DIAGNOSIS — F4312 Post-traumatic stress disorder, chronic: Secondary | ICD-10-CM | POA: Diagnosis not present

## 2024-05-26 DIAGNOSIS — F411 Generalized anxiety disorder: Secondary | ICD-10-CM | POA: Diagnosis not present

## 2024-05-26 DIAGNOSIS — F4001 Agoraphobia with panic disorder: Secondary | ICD-10-CM | POA: Diagnosis not present

## 2024-05-26 DIAGNOSIS — F331 Major depressive disorder, recurrent, moderate: Secondary | ICD-10-CM | POA: Diagnosis not present

## 2024-05-30 DIAGNOSIS — F411 Generalized anxiety disorder: Secondary | ICD-10-CM | POA: Diagnosis not present

## 2024-05-30 DIAGNOSIS — F4312 Post-traumatic stress disorder, chronic: Secondary | ICD-10-CM | POA: Diagnosis not present

## 2024-05-30 DIAGNOSIS — F331 Major depressive disorder, recurrent, moderate: Secondary | ICD-10-CM | POA: Diagnosis not present

## 2024-05-30 DIAGNOSIS — F4001 Agoraphobia with panic disorder: Secondary | ICD-10-CM | POA: Diagnosis not present

## 2024-05-31 NOTE — Progress Notes (Signed)
 This is a Pediatric Specialist E-Visit consult/follow up provided via My Chart Video Visit (Caregility). Alison Brock consented to an E-Visit consult today.  Is the patient present for the video visit? Yes Location of patient: Alison Brock is at home  Is the patient located in the state of [place] ? Yes Location of provider: Ellouise Bollman, NP-C is at office Patient was referred by Nche, Roselie Rockford, NP   The following participants were involved in this E-Visit: CMA, NP, patient   This visit was done via VIDEO   Chief Complain/ Reason for E-Visit today: seizure follow up Total time on call: 15 minutes Follow up: 6 months   Alison Brock   MRN:  [id number]  2001-08-31   Provider: Ellouise Bollman NP-C Location of Care: [facility]  Visit type: Return visit  Last visit: 12/17/2023  Referral source: Katheen Roselie Rockford, NP History from: Epic chart and patient  Brief history:  Copied from previous record: History of generalized seizure disorder based on clinical seizure activity and EEG, as well as headaches, anxiety and panic. She is taking and tolerating Levetiracetam  XR for her seizure disorder and has remained seizure free since April 22, 2022. She is taking and tolerating Amitriptyline  for migraine prevention. Alison Brock is being followed by a psychiatrist and a therapist   Today's concerns: She reports that she has had a couple of psychogenic seizures since her last visit. These occurred during times of increased anxiety She continues to work closely with her therapist. She reports some depression over the last couple of weeks and is doing the exercises given to her by the therapist She denies any epileptic events since her last visit She is applying for disability and asked for a letter of support for her application Alison Brock has been otherwise generally healthy since she was last seen. No health concerns today other than previously  mentioned.  Review of systems: Please see HPI for neurologic and other pertinent review of systems. Otherwise all other systems were reviewed and were negative.  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   Seizure disorder (HCC) 06/26/2016   AP (abdominal pain) 06/02/2016   Loss of weight 06/02/2016   Constipation 06/02/2016     Past Medical History:  Diagnosis Date   Anxiety    Seizures (HCC)     Past medical history comments: See HPI  Surgical history: Past Surgical History:  Procedure Laterality Date   GUM SURGERY  03/2016    Family history: family history includes Alcohol abuse in her father; Anxiety disorder in her maternal aunt; Bipolar disorder in her maternal aunt; Depression in her paternal grandfather; Drug abuse in her father; Hypertension in her maternal grandmother, mother, and paternal grandfather; Uterine cancer in her mother.   Social history: Social History   Socioeconomic History   Marital status: Single    Spouse name: Not on file   Number of children: Not on file   Years of education: Not on file   Highest education level: 11th grade  Occupational History   Not on file  Tobacco Use  Smoking status: Every Day    Types: E-cigarettes    Start date: 08/20/2021    Passive exposure: Yes   Smokeless tobacco: Never  Vaping Use   Vaping status: Every Day   Start date: 10/21/2019   Substances: Nicotine   Devices: Vuse  Substance and Sexual Activity   Alcohol use: No   Drug use: No   Sexual activity: Yes    Birth  control/protection: Condom, Pill  Other Topics Concern   Not on file  Social History Narrative   Lives with her grandmother.   1 dog   Alison Brock Vapes   Social Drivers of Health   Financial Resource Strain: High Risk (03/04/2024)   Overall Financial Resource Strain (CARDIA)    Difficulty of Paying Living Expenses: Very hard  Food Insecurity: No Food Insecurity (03/04/2024)   Hunger Vital Sign    Worried About Running Out of Food in the Last Year: Never true    Ran Out of Food in the Last Year: Never true  Transportation Needs: No Transportation Needs (03/04/2024)   PRAPARE - Administrator, Civil Service (Medical): No    Lack of Transportation (Non-Medical): No  Physical Activity: Unknown (03/04/2024)   Exercise Vital Sign    Days of Exercise per Week: 0 days    Minutes of Exercise per Session: Not on file  Stress: Stress Concern Present (03/04/2024)   Alison Brock of Occupational Health - Occupational Stress Questionnaire    Feeling of Stress : Very much  Social Connections: Socially Isolated (03/04/2024)   Social Connection and Isolation Panel    Frequency of Communication with Friends and Family: More than three times a week    Frequency of Social Gatherings with Friends and Family: Once a week    Attends Religious Services: Never    Database administrator or Organizations: No    Attends Engineer, structural: Not on file    Marital Status: Never married  Intimate Partner Violence: Unknown (06/02/2019)   Humiliation, Afraid, Rape, and Kick questionnaire    Fear of Current or Ex-Partner: Patient declined    Emotionally Abused: Patient declined    Physically Abused: Patient declined    Sexually Abused: Patient declined    Past/failed meds: Copied from previous record: Topiramate  ER - worsened anxiety and panic  Klonopin - ineffective Fluoxetine - ineffective  Allergies: Allergies  Allergen Reactions   Sulfamethoxazole-Trimethoprim Nausea And Vomiting    Bentyl  [Dicyclomine  Hcl]    Doxycycline Other (See Comments)    Nausea and hallucination  doxycycline   Grapefruit Extract Other (See Comments)    Pt is not supposed to eat grapefruit with medication    Other     Seasonal Allergies - Spring and Fall   Topiramate  Other (See Comments)    Topamax    Topiramate  Er     Immunizations: Immunization History  Administered Date(s) Administered   Dtap, Unspecified 11/15/2001, 01/18/2002, 03/21/2002, 02/28/2003, 10/14/2005   HIB, Unspecified 11/15/2001, 01/18/2002, 03/21/2002, 02/06/2003   Hep B, Unspecified 08/03/01, 10/11/2001, 06/06/2002   Influenza, Seasonal, Injecte, Preservative Fre 08/02/2009, 08/06/2011   Influenza,inj,Quad PF,6+ Mos 08/23/2012, 07/19/2014   Influenza-Unspecified 08/11/2003, 10/06/2003, 09/11/2004, 10/14/2005, 09/14/2006, 07/18/2008   MMR 02/06/2003, 10/14/2005   Meningococcal Conjugate 05/17/2014   PFIZER(Purple Top)SARS-COV-2 Vaccination 07/07/2020, 08/04/2020   Pneumococcal-Unspecified 11/15/2001, 01/18/2002, 03/21/2002, 09/13/2002   Polio, Unspecified 11/15/2001, 01/18/2002, 02/06/2003, 10/14/2005   Tdap 01/10/2013   Varicella 09/13/2002, 10/14/2005    Diagnostics/Screenings: Copied from previous record: 12/07/2022 - extended EEG -  This routine EEG for 71 minutes during awake state is slightly abnormal due to occasional sporadic brief discharges in the form of spikes and slow wave activity, mostly in the frontal area but no other epileptiform discharges or seizure activity or any transient rhythmic activity noted. The findings are consistent with brief focal cortical irritability with slight increased epileptic potential.  Clinical correlation is indicated.   Norwood Abu, MD   04/24/2021 - rEEG - This EEG is unremarkable during awake state. There were just a couple of single sharps in the bilateral frontal area which is not significant.  This is significant improvement compared to her initial EEGs with  frequent generalized discharges. Please note that normal EEG does not exclude epilepsy, clinical correlation is indicated.  Norwood Abu, MD   04/29/18 - rEEG - This EEG is abnormal due to episodes of generalized discharges, more frontally predominant throughout the recording. The findings consistent with generalized seizure disorder, possibly juvenile myoclonic epilepsy, associated with lower seizure threshold and require careful clinical correlation    07/27/2016 - MRI Brain w/wo contrast - Artifact related to braces. Allowing for that, the examination is normal. No evidence of malformation. No evidence of acquired brain disease.  Physical Exam: LMP 04/21/2024 (Approximate)   General: well developed, well nourished young woman, seated at her home, in no evident distress Head: normocephalic and atraumatic. No dysmorphic features. Neck: supple Musculoskeletal: No skeletal deformities or obvious scoliosis Skin: no rashes or neurocutaneous lesions  Neurologic Exam Mental Status: Awake and fully alert.  Attention span, concentration, and fund of knowledge appropriate for age.  Speech fluent without dysarthria.  Able to follow commands and participate in examination. Cranial Nerves: Turns to localize faces, objects and sounds in the periphery. Facial sensation intact.  Face, tongue, palate move normally and symmetrically. Motor: Normal functional bulk, tone and strength Coordination: No dysmetria with reach for objects  Impression: Nonintractable juvenile myoclonic epilepsy without status epilepticus (HCC)  Anxiety  Panic disorder  Frequent headaches  Depression, unspecified depression type  Psychogenic nonepileptic seizure   Recommendations for plan of care: The patient's previous Epic records were reviewed. No recent diagnostic studies to be reviewed with the patient.  Plan until next visit: Continue medications as prescribed  Continue close follow up with behavioral health  team I will write a letter regarding condition and send to her Call for questions or concerns Return in about 6 months (around 12/02/2024).  The medication list was reviewed and reconciled. No changes were made in the prescribed medications today. A complete medication list was provided to the patient.  Allergies as of 06/01/2024       Reactions   Sulfamethoxazole-trimethoprim Nausea And Vomiting   Bentyl  [dicyclomine  Hcl]    Doxycycline Other (See Comments)   Nausea and hallucination doxycycline   Grapefruit Extract Other (See Comments)   Pt is not supposed to eat grapefruit with medication    Other    Seasonal Allergies - Spring and Fall   Topiramate  Other (See Comments)   Topamax    Topiramate  Er         Medication List        Accurate as of May 31, 2024  8:04 PM. If you have any questions, ask your nurse or doctor.          amitriptyline  25 MG tablet Commonly known as: ELAVIL  TAKE TWO TABLETS BY MOUTH EVERY NIGHT AT BEDTIME What changed:  how much to take how to take this   busPIRone  15 MG tablet  Commonly known as: BUSPAR  Take 20 mg by mouth 3 (three) times daily.   fluticasone  50 MCG/ACT nasal spray Commonly known as: FLONASE  Place 2 sprays into both nostrils daily.   levETIRAcetam  750 MG 24 hr tablet Commonly known as: KEPPRA  XR TAKE 2 TABLETS BY MOUTH EVERY MORNING AND TAKE TWO TABLETS BY MOUTH EVERY NIGHT AT BEDTIME   LORazepam  0.5 MG tablet Commonly known as: ATIVAN  Take 1/2 tablet 3 times per day for anxiety What changed: additional instructions   Low-Ogestrel  0.3-30 MG-MCG tablet Generic drug: norgestrel-ethinyl estradiol Take 1 tablet by mouth daily.   Magnesium  Oxide -Mg Supplement 500 MG Tabs Take 500 mg by mouth daily.   pyridOXINE  100 MG tablet Commonly known as: VITAMIN B6 Take 100 mg by mouth daily.   riboflavin  100 MG Tabs tablet Commonly known as: VITAMIN B-2 Take 100 mg by mouth daily.      Total time spent with the  patient was 15 minutes, of which 50% or more was spent in counseling and coordination of care.  Ellouise Bollman NP-C [facility] [address] [place], [place] [address] Ph. [phone] Fax [phone]

## 2024-06-01 ENCOUNTER — Encounter (INDEPENDENT_AMBULATORY_CARE_PROVIDER_SITE_OTHER): Payer: Self-pay

## 2024-06-01 ENCOUNTER — Encounter (INDEPENDENT_AMBULATORY_CARE_PROVIDER_SITE_OTHER): Payer: Self-pay | Admitting: Family

## 2024-06-01 ENCOUNTER — Telehealth (INDEPENDENT_AMBULATORY_CARE_PROVIDER_SITE_OTHER): Admitting: Family

## 2024-06-01 DIAGNOSIS — F410 Panic disorder [episodic paroxysmal anxiety] without agoraphobia: Secondary | ICD-10-CM | POA: Diagnosis not present

## 2024-06-01 DIAGNOSIS — F445 Conversion disorder with seizures or convulsions: Secondary | ICD-10-CM

## 2024-06-01 DIAGNOSIS — F32A Depression, unspecified: Secondary | ICD-10-CM

## 2024-06-01 DIAGNOSIS — F419 Anxiety disorder, unspecified: Secondary | ICD-10-CM

## 2024-06-01 DIAGNOSIS — G40B09 Juvenile myoclonic epilepsy, not intractable, without status epilepticus: Secondary | ICD-10-CM | POA: Diagnosis not present

## 2024-06-01 DIAGNOSIS — R519 Headache, unspecified: Secondary | ICD-10-CM | POA: Diagnosis not present

## 2024-06-01 NOTE — Patient Instructions (Signed)
 It was a pleasure to see you today!  Instructions for you until your next appointment are as follows: Continue taking your medications as prescribed.  Let me know if any seizures occur or if you have any questions or concerns I will send you a letter of support for your Disability application through MyChart Please sign up for MyChart if you have not done so. Please plan to return for follow up in 6 months as scheduled or sooner if needed.  Feel free to contact our office during normal business hours at [phone] with questions or concerns. If there is no answer or the call is outside business hours, please leave a message and our clinic staff will call you back within the next business day.  If you have an urgent concern, please stay on the line for our after-hours answering service and ask for the on-call neurologist.     I also encourage you to use MyChart to communicate with me more directly. If you have not yet signed up for MyChart within [facility], the front desk staff can help you. However, please note that this inbox is NOT monitored on nights or weekends, and response can take up to 2 business days.  Urgent matters should be discussed with the on-call pediatric neurologist.   At [facility], we are committed to providing exceptional care. You will receive a patient satisfaction survey through text or email regarding your visit today. Your opinion is important to me. Comments are appreciated.

## 2024-06-02 DIAGNOSIS — F4312 Post-traumatic stress disorder, chronic: Secondary | ICD-10-CM | POA: Diagnosis not present

## 2024-06-02 DIAGNOSIS — F4001 Agoraphobia with panic disorder: Secondary | ICD-10-CM | POA: Diagnosis not present

## 2024-06-02 DIAGNOSIS — F331 Major depressive disorder, recurrent, moderate: Secondary | ICD-10-CM | POA: Diagnosis not present

## 2024-06-02 DIAGNOSIS — F411 Generalized anxiety disorder: Secondary | ICD-10-CM | POA: Diagnosis not present

## 2024-06-03 ENCOUNTER — Encounter (INDEPENDENT_AMBULATORY_CARE_PROVIDER_SITE_OTHER): Payer: Self-pay | Admitting: Family

## 2024-06-03 DIAGNOSIS — F445 Conversion disorder with seizures or convulsions: Secondary | ICD-10-CM | POA: Insufficient documentation

## 2024-06-03 DIAGNOSIS — F32A Depression, unspecified: Secondary | ICD-10-CM | POA: Insufficient documentation

## 2024-06-06 ENCOUNTER — Telehealth (INDEPENDENT_AMBULATORY_CARE_PROVIDER_SITE_OTHER): Payer: Self-pay | Admitting: Family

## 2024-06-06 DIAGNOSIS — F331 Major depressive disorder, recurrent, moderate: Secondary | ICD-10-CM | POA: Diagnosis not present

## 2024-06-06 DIAGNOSIS — F411 Generalized anxiety disorder: Secondary | ICD-10-CM | POA: Diagnosis not present

## 2024-06-06 DIAGNOSIS — F4312 Post-traumatic stress disorder, chronic: Secondary | ICD-10-CM | POA: Diagnosis not present

## 2024-06-06 DIAGNOSIS — F4001 Agoraphobia with panic disorder: Secondary | ICD-10-CM | POA: Diagnosis not present

## 2024-06-07 ENCOUNTER — Telehealth (INDEPENDENT_AMBULATORY_CARE_PROVIDER_SITE_OTHER): Payer: Self-pay | Admitting: Family

## 2024-06-07 NOTE — Telephone Encounter (Signed)
 Contacted patient.  Verified patients name and DOB as well as mothers name.   Patient stated that she spoke to Mrs. Tina after hours yesterday.   No further assistance needed.   SS, CCMA

## 2024-06-07 NOTE — Telephone Encounter (Signed)
 Per the Telephone Advice Record for after hours the patient called to state if she is unsure if she took the dose of Ametropolin.

## 2024-06-07 NOTE — Telephone Encounter (Signed)
 I received a call from [facility] On Call service to speak with Alison Brock. The message stated that she couldn't remember if she had taken the Amitriptyline  dose and wasn't sure what to do. I returned the call but received voicemail. I left her a message to let her know that she can skip the dose or she can take it and if it is an extra dose that it might make her sleepier than usual but otherwise would not harm her. I invited Alison Brock to call back if she had questions.

## 2024-06-09 DIAGNOSIS — F331 Major depressive disorder, recurrent, moderate: Secondary | ICD-10-CM | POA: Diagnosis not present

## 2024-06-09 DIAGNOSIS — F411 Generalized anxiety disorder: Secondary | ICD-10-CM | POA: Diagnosis not present

## 2024-06-09 DIAGNOSIS — F4001 Agoraphobia with panic disorder: Secondary | ICD-10-CM | POA: Diagnosis not present

## 2024-06-09 DIAGNOSIS — F4312 Post-traumatic stress disorder, chronic: Secondary | ICD-10-CM | POA: Diagnosis not present

## 2024-06-11 ENCOUNTER — Ambulatory Visit
Admission: EM | Admit: 2024-06-11 | Discharge: 2024-06-11 | Disposition: A | Discharge status: Home / Self Care | Attending: Physician Assistant | Admitting: Physician Assistant

## 2024-06-11 ENCOUNTER — Other Ambulatory Visit: Payer: Self-pay

## 2024-06-11 DIAGNOSIS — R300 Dysuria: Secondary | ICD-10-CM | POA: Diagnosis not present

## 2024-06-11 DIAGNOSIS — R350 Frequency of micturition: Secondary | ICD-10-CM | POA: Diagnosis not present

## 2024-06-11 LAB — POCT URINE DIPSTICK
Bilirubin, UA: NEGATIVE
Glucose, UA: NEGATIVE mg/dL
Ketones, POC UA: NEGATIVE mg/dL
Nitrite, UA: NEGATIVE
POC PROTEIN,UA: NEGATIVE
Spec Grav, UA: 1.01 (ref 1.010–1.025)
Urobilinogen, UA: 0.2 U/dL
pH, UA: 6.5 (ref 5.0–8.0)

## 2024-06-11 LAB — POCT URINE PREGNANCY: Preg Test, Ur: NEGATIVE

## 2024-06-11 MED ORDER — CEPHALEXIN 500 MG PO CAPS: 500.0000 mg | ORAL_CAPSULE | Freq: Two times a day (BID) | ORAL | 0 refills | Status: AC

## 2024-06-11 NOTE — ED Notes (Signed)
 Pt is waiting in the car while waiting for [place], [place] to come into the room. States her anxiety is getting bad and she needs to go out to the car to calm down. Her mom is in the room and will call the pt back inside whenever [place], [place] comes in.

## 2024-06-11 NOTE — ED Notes (Addendum)
 Pt provided urine sample in lobby. Pt requested to wait in the car until [place], [place] was ready to triage her because of her severe anxiety.

## 2024-06-11 NOTE — Discharge Instructions (Addendum)
 Based on your symptoms and results of the urinalysis I believe you have a UTI I recommend the following:  I have sent in a script for Keflex  500 mg to be taken by mouth twice per day for 5 days Please finish the entire course of the antibiotic even if you are feeling better before it is completed unless you develop an allergic reaction or are told by a medical provider to stop taking it. We have sent a sample of your urine off for a urine culture.  This will help us  determine what bacteria is causing your symptoms as well as the most appropriate antibiotic to treat it.  If we need to make any adjustments to your medication regimen and new medication will be sent to the pharmacy on file and you will be updated via phone call in MyChart. Stay well hydrated (at least 75 oz of water per day) and avoid holding your urine for prolonged periods of time. If you have any of the following please return to urgent care or go to the emergency room: Persistent symptoms, fever, trouble urinating or inability to urinate, confusion, flank pain.

## 2024-06-11 NOTE — ED Triage Notes (Signed)
 Pt presents with complaints of urinary frequency and burning with urination that began last night, 8/22. Pt states she is on her menstrual period and having pelvic cramping. Currently rates overall pain a 4/10. Denies taking OTC medications PTA.

## 2024-06-11 NOTE — ED Provider Notes (Signed)
 [facility]    CSN: [id number] Arrival date & time: 06/11/24  9046      History   Chief Complaint Chief Complaint  Patient presents with   Urinary Frequency    HPI Militza Brock is a 23 y.o. female.   HPI  Pt presents today with concerns for dysuria, increased urinary frequency, and lower abdominal cramping that started last night She states she is currently on her menses which may be causing some of her symptoms such as cramping  She also reports previous hx of interstitial cystitis so she is not sure if her symptoms may be coming from a flare or not   Interventions: nothing yet   Past Medical History:  Diagnosis Date   Anxiety    Seizures (HCC)     Patient Active Problem List   Diagnosis Date Noted   Depression 06/03/2024   Psychogenic nonepileptic seizure 06/03/2024   Cyst of subcutaneous tissue 03/07/2024   Left foot pain 03/07/2022   Encounter for long-term current use of medication 11/18/2020   Pyelonephritis 06/02/2019   Migraine variants, not intractable 02/09/2019   Panic disorder 12/25/2018   Social anxiety disorder 10/06/2018   Frequent headaches 07/08/2016   Anxiety 06/26/2016   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 Authorizing Provider  cephALEXin  (KEFLEX ) 500 MG capsule Take 1 capsule (500 mg total) by mouth 2 (two) times daily for 5 days. 06/11/24 06/16/24 Yes Makya Phillis E, PA-C  amitriptyline   (ELAVIL ) 25 MG tablet TAKE TWO TABLETS BY MOUTH EVERY NIGHT AT BEDTIME 12/19/23   Marianna City, NP  busPIRone  (BUSPAR ) 15 MG tablet Take 20 mg by mouth 3 (three) times daily. 12/15/18   [provider]  fluticasone  (FLONASE ) 50 MCG/ACT nasal spray Place 2 sprays into both nostrils daily. Patient not taking: Reported on 06/01/2024 03/12/24   Enedelia Dorna HERO, FNP  levETIRAcetam  (KEPPRA  XR) 750 MG 24 hr tablet TAKE 2 TABLETS BY MOUTH EVERY MORNING AND TAKE TWO TABLETS BY MOUTH EVERY NIGHT AT BEDTIME 12/19/23   Marianna City, NP  LORazepam  (ATIVAN ) 0.5 MG tablet Take 1/2 tablet 3 times per day for anxiety Patient taking differently: Take 3/4 tablet 3 times per day for anxiety 05/29/22   Marianna City, NP  LOW-OGESTREL  0.3-30 MG-MCG tablet Take 1 tablet by mouth daily. 03/07/24   Nche, Roselie Rockford, NP  Magnesium  Oxide 500 MG TABS Take 500 mg by mouth daily.     [provider]  pyridOXINE  (VITAMIN B-6) 100 MG tablet Take 100 mg by mouth daily.    [provider]  riboflavin  (VITAMIN B-2) 100 MG TABS tablet Take 100 mg by mouth daily.  [provider]    Family History Family History  Problem Relation Age of Onset   Hypertension Mother    Uterine cancer Mother    Drug abuse Father    Alcohol abuse Father    Bipolar disorder Maternal Aunt    Anxiety disorder Maternal Aunt    Hypertension Maternal Grandmother    Hypertension Paternal Grandfather    Depression Paternal Grandfather     Social History Social History   Tobacco Use   Smoking status: Every Day    Types: E-cigarettes    Start date: 08/20/2021    Passive exposure: Yes   Smokeless tobacco: Never  Vaping Use   Vaping status: Every Day   Start date: 10/21/2019   Substances: Nicotine   Devices: Vuse  Substance Use Topics   Alcohol use: No   Drug use: No     Allergies   Sulfamethoxazole-trimethoprim, Bentyl  [dicyclomine  hcl], Doxycycline, Grapefruit extract, Other, Topiramate ,  and Topiramate  er   Review of Systems Review of Systems  Constitutional:  Negative for chills and fever.  Gastrointestinal:  Positive for abdominal pain (pelvic cramping). Negative for diarrhea, nausea and vomiting.  Genitourinary:  Positive for dysuria, frequency and vaginal bleeding (currently on menses). Negative for decreased urine volume, difficulty urinating, flank pain, hematuria, urgency, vaginal discharge and vaginal pain.     Physical Exam Triage Vital Signs ED Triage Vitals  Encounter Vitals Group     BP 06/11/24 1031 (!) 135/94     Girls Systolic BP Percentile --      Girls Diastolic BP Percentile --      Boys Systolic BP Percentile --      Boys Diastolic BP Percentile --      Pulse Rate 06/11/24 1031 (!) 110     Resp 06/11/24 1031 19     Temp 06/11/24 1031 98.1 F (36.7 C)     Temp Source 06/11/24 1031 Oral     SpO2 06/11/24 1031 97 %     Weight 06/11/24 1032 282 lb 3 oz (128 kg)     Height 06/11/24 1032 5' 3 (1.6 m)     Head Circumference --      Peak Flow --      Pain Score 06/11/24 1032 4     Pain Loc --      Pain Education --      Exclude from Growth Chart --    No data found.  Updated Vital Signs BP (!) 135/94 (BP Location: Right Arm)   Pulse (!) 110   Temp 98.1 F (36.7 C) (Oral)   Resp 19   Ht 5' 3 (1.6 m)   Wt 282 lb 3 oz (128 kg)   LMP 06/10/2024 (Approximate)   SpO2 97%   BMI 49.99 kg/m   Visual Acuity Right Eye Distance:   Left Eye Distance:   Bilateral Distance:    Right Eye Near:   Left Eye Near:    Bilateral Near:     Physical Exam Vitals reviewed.  Constitutional:      General: She is awake.     Appearance: Normal appearance. She is well-developed and well-groomed.  HENT:     Head: Normocephalic and atraumatic.  Eyes:     General: Lids are normal. Gaze aligned appropriately.     Extraocular Movements: Extraocular movements intact.     Conjunctiva/sclera: Conjunctivae normal.  Pulmonary:     Effort: Pulmonary effort  is normal.  Neurological:     Mental Status: She is  alert and oriented to person, place, and time.  Psychiatric:        Attention and Perception: Attention and perception normal.        Mood and Affect: Affect normal. Mood is anxious.        Speech: Speech normal.        Behavior: Behavior normal. Behavior is cooperative.      UC Treatments / Results  Labs (all labs ordered are listed, but only abnormal results are displayed) Labs Reviewed  POCT URINE DIPSTICK - Abnormal; Notable for the following components:      Result Value   Color, UA light yellow (*)    Clarity, UA hazy (*)    Blood, UA large (*)    Leukocytes, UA Trace (*)    All other components within normal limits  URINE CULTURE  POCT URINE PREGNANCY    EKG   Radiology No results found.  Procedures Procedures (including critical care time)  Medications Ordered in UC Medications - No data to display  Initial Impression / Assessment and Plan / UC Course  I have reviewed the triage vital signs and the nursing notes.  Pertinent labs & imaging results that were available during my care of the patient were reviewed by me and considered in my medical decision making (see chart for details).      Final Clinical Impressions(s) / UC Diagnoses   Final diagnoses:  Dysuria    Patient reports symptoms comprised of the following: dysuria, increased urinary frequency, since last night  Results of UA are consistent with UTI - urine sample sent for culture to determine causative organism and susceptibility- results to dictate further management  Pt voices significant anxiety about medication side effects and potential reactions due to previous experiences.  Review of previous medications reveals that patient has tolerated cephalosporins without notable issues so offered Keflex  instead of nitrofurantoin.  I discussed this with the patient to assist with her increased anxiety regarding starting an antibiotic.  Patient  declines Pyridium citing concerns for potential medication interactions/side effects.  She reports that she will take OTC ibuprofen  as needed for pain management. Recommend starting Keflex  500 mg PO BID x 5 days.  Will provide script - discussed importance of finishing entire course of abx and staying well hydrated while recovering from UTI Reviewed ED precautions with patient Follow up as needed for persistent or worsening symptoms     Discharge Instructions      Based on your symptoms and results of the urinalysis I believe you have a UTI I recommend the following:  I have sent in a script for Keflex  500 mg to be taken by mouth twice per day for 5 days   Please finish the entire course of the antibiotic even if you are feeling better before it is completed unless you develop an allergic reaction or are told by a medical provider to stop taking it. We have sent a sample of your urine off for a urine culture.  This will help us  determine what bacteria is causing your symptoms as well as the most appropriate antibiotic to treat it.  If we need to make any adjustments to your medication regimen and new medication will be sent to the pharmacy on file and you will be updated via phone call in MyChart. Stay well hydrated (at least 75 oz of water per day) and avoid holding your urine for prolonged periods of time. If you have any of the following please return to urgent  care or go to the emergency room: Persistent symptoms, fever, trouble urinating or inability to urinate, confusion, flank pain.       ED Prescriptions     Medication Sig Dispense Auth. Provider   cephALEXin  (KEFLEX ) 500 MG capsule Take 1 capsule (500 mg total) by mouth 2 (two) times daily for 5 days. 10 capsule Zavien Clubb E, PA-C      PDMP not reviewed this encounter.   Marylene Rocky BRAVO, PA-C 06/11/24 8892

## 2024-06-13 ENCOUNTER — Telehealth (HOSPITAL_COMMUNITY): Payer: Self-pay

## 2024-06-13 DIAGNOSIS — F4312 Post-traumatic stress disorder, chronic: Secondary | ICD-10-CM | POA: Diagnosis not present

## 2024-06-13 DIAGNOSIS — F331 Major depressive disorder, recurrent, moderate: Secondary | ICD-10-CM | POA: Diagnosis not present

## 2024-06-13 DIAGNOSIS — N12 Tubulo-interstitial nephritis, not specified as acute or chronic: Secondary | ICD-10-CM | POA: Diagnosis not present

## 2024-06-13 DIAGNOSIS — F4001 Agoraphobia with panic disorder: Secondary | ICD-10-CM | POA: Diagnosis not present

## 2024-06-13 DIAGNOSIS — F411 Generalized anxiety disorder: Secondary | ICD-10-CM | POA: Diagnosis not present

## 2024-06-13 LAB — URINE CULTURE

## 2024-06-13 NOTE — Telephone Encounter (Signed)
 Returned pts call. Pt had questions about urine culture. Pt notified results suggested recollect. All questions answered.

## 2024-06-13 NOTE — ED Notes (Addendum)
 Pt returned to clinic for recollect urine for culture suggested by lab.

## 2024-06-15 LAB — URINE CULTURE: Culture: NO GROWTH

## 2024-06-16 DIAGNOSIS — F331 Major depressive disorder, recurrent, moderate: Secondary | ICD-10-CM | POA: Diagnosis not present

## 2024-06-16 DIAGNOSIS — F4001 Agoraphobia with panic disorder: Secondary | ICD-10-CM | POA: Diagnosis not present

## 2024-06-16 DIAGNOSIS — F411 Generalized anxiety disorder: Secondary | ICD-10-CM | POA: Diagnosis not present

## 2024-06-16 DIAGNOSIS — F4312 Post-traumatic stress disorder, chronic: Secondary | ICD-10-CM | POA: Diagnosis not present

## 2024-06-17 ENCOUNTER — Ambulatory Visit (HOSPITAL_COMMUNITY): Payer: Self-pay

## 2024-06-20 DIAGNOSIS — F4312 Post-traumatic stress disorder, chronic: Secondary | ICD-10-CM | POA: Diagnosis not present

## 2024-06-20 DIAGNOSIS — F331 Major depressive disorder, recurrent, moderate: Secondary | ICD-10-CM | POA: Diagnosis not present

## 2024-06-20 DIAGNOSIS — F4001 Agoraphobia with panic disorder: Secondary | ICD-10-CM | POA: Diagnosis not present

## 2024-06-20 DIAGNOSIS — F411 Generalized anxiety disorder: Secondary | ICD-10-CM | POA: Diagnosis not present

## 2024-06-21 ENCOUNTER — Other Ambulatory Visit (INDEPENDENT_AMBULATORY_CARE_PROVIDER_SITE_OTHER): Payer: Self-pay

## 2024-06-21 DIAGNOSIS — G40B09 Juvenile myoclonic epilepsy, not intractable, without status epilepticus: Secondary | ICD-10-CM

## 2024-06-21 MED ORDER — LEVETIRACETAM ER 750 MG PO TB24: ORAL_TABLET | ORAL | 5 refills | Status: AC

## 2024-06-23 DIAGNOSIS — F331 Major depressive disorder, recurrent, moderate: Secondary | ICD-10-CM | POA: Diagnosis not present

## 2024-06-23 DIAGNOSIS — F4001 Agoraphobia with panic disorder: Secondary | ICD-10-CM | POA: Diagnosis not present

## 2024-06-23 DIAGNOSIS — F411 Generalized anxiety disorder: Secondary | ICD-10-CM | POA: Diagnosis not present

## 2024-06-23 DIAGNOSIS — F4312 Post-traumatic stress disorder, chronic: Secondary | ICD-10-CM | POA: Diagnosis not present

## 2024-06-27 ENCOUNTER — Ambulatory Visit
Admission: EM | Admit: 2024-06-27 | Discharge: 2024-06-27 | Disposition: A | Discharge status: Home / Self Care | Attending: Emergency Medicine | Admitting: Emergency Medicine

## 2024-06-27 ENCOUNTER — Encounter: Payer: Self-pay | Admitting: Family Medicine

## 2024-06-27 ENCOUNTER — Telehealth (INDEPENDENT_AMBULATORY_CARE_PROVIDER_SITE_OTHER): Admitting: Family Medicine

## 2024-06-27 ENCOUNTER — Ambulatory Visit: Admit: 2024-06-27 | Discharge: 2024-06-27 | Disposition: A | Discharge status: Home / Self Care

## 2024-06-27 VITALS — Ht 63.0 in

## 2024-06-27 DIAGNOSIS — F17290 Nicotine dependence, other tobacco product, uncomplicated: Secondary | ICD-10-CM | POA: Diagnosis not present

## 2024-06-27 DIAGNOSIS — F331 Major depressive disorder, recurrent, moderate: Secondary | ICD-10-CM | POA: Diagnosis not present

## 2024-06-27 DIAGNOSIS — L0291 Cutaneous abscess, unspecified: Secondary | ICD-10-CM

## 2024-06-27 DIAGNOSIS — F4001 Agoraphobia with panic disorder: Secondary | ICD-10-CM | POA: Diagnosis not present

## 2024-06-27 DIAGNOSIS — L0231 Cutaneous abscess of buttock: Secondary | ICD-10-CM | POA: Insufficient documentation

## 2024-06-27 DIAGNOSIS — F411 Generalized anxiety disorder: Secondary | ICD-10-CM | POA: Diagnosis not present

## 2024-06-27 DIAGNOSIS — F4312 Post-traumatic stress disorder, chronic: Secondary | ICD-10-CM | POA: Diagnosis not present

## 2024-06-27 MED ORDER — AMOXICILLIN-POT CLAVULANATE 875-125 MG PO TABS
1.0000 | ORAL_TABLET | Freq: Two times a day (BID) | ORAL | 0 refills | Status: AC
Start: 2024-06-27 — End: 2024-07-04

## 2024-06-27 NOTE — ED Provider Notes (Signed)
 [facility]    CSN: [id number] Arrival date & time: 06/27/24  1623      History   Chief Complaint Chief Complaint  Patient presents with   Abscess    HPI Alison Brock is a 23 y.o. female.   Patient presents to clinic for concern of a draining abscess between her buttocks.  Noticed the area around a week ago.  Has been doing warm compresses and Epsom salt baths.  Did do a virtual visit where she got prescribed Augmentin , just started taking that this morning.  Area opened up and had a considerable amount of purulent drainage earlier today.  Scant bloody drainage currently, using a gauze in between her buttocks.  Patient has not had any nausea, vomiting or fevers.  Does have anxiety in medical situations.  Mother and significant other present with patient in clinic.  The history is provided by the patient, medical records, a parent and a significant other.    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 Authorizing Provider  amitriptyline  (ELAVIL ) 25 MG tablet TAKE TWO TABLETS BY MOUTH EVERY NIGHT AT BEDTIME 12/19/23   Marianna City, NP  amoxicillin -clavulanate (AUGMENTIN ) 875-125 MG tablet Take 1 tablet by mouth 2 (two) times daily for 7 days. 06/27/24 07/04/24  [place], Betty G, MD  busPIRone  (BUSPAR ) 15 MG tablet Take 20 mg by mouth 3 (three) times daily. 12/15/18   [provider]  fluticasone  (FLONASE ) 50 MCG/ACT nasal spray Place 2 sprays into both nostrils daily. Patient not taking: Reported on 06/27/2024 03/12/24   Enedelia Dorna HERO, FNP  levETIRAcetam  (KEPPRA  XR) 750 MG 24 hr tablet TAKE 2 TABLETS BY MOUTH EVERY MORNING AND TAKE TWO TABLETS BY MOUTH EVERY NIGHT AT BEDTIME 06/21/24   Marianna City, NP  LORazepam  (ATIVAN ) 0.5 MG tablet Take 1/2 tablet 3 times per day for anxiety Patient taking differently: Take 3/4 tablet 3 times per day for anxiety 05/29/22   Marianna City, NP  LOW-OGESTREL  0.3-30 MG-MCG tablet Take 1 tablet by mouth daily. 03/07/24   Nche, Roselie Rockford, NP  Magnesium  Oxide 500 MG TABS Take 500  mg by mouth daily.     [provider]  pyridOXINE  (VITAMIN B-6) 100 MG tablet Take 100 mg by mouth daily.    [provider]  riboflavin  (VITAMIN B-2) 100 MG TABS tablet Take 100 mg by mouth daily.    [provider]    Family History Family History  Problem Relation Age of Onset   Hypertension Mother    Uterine cancer Mother    Drug abuse Father    Alcohol abuse Father    Bipolar disorder Maternal Aunt    Anxiety disorder Maternal Aunt    Hypertension Maternal Grandmother    Hypertension Paternal Grandfather    Depression Paternal Grandfather     Social History Social History   Tobacco Use   Smoking status: Every Day    Types: E-cigarettes    Start date: 08/20/2021    Passive exposure: Yes   Smokeless tobacco: Never  Vaping Use    Vaping status: Every Day   Start date: 10/21/2019   Substances: Nicotine   Devices: Vuse  Substance Use Topics   Alcohol use: No   Drug use: No     Allergies   Sulfamethoxazole-trimethoprim, Bentyl  [dicyclomine  hcl], Doxycycline, Grapefruit extract, Other, Topiramate , and Topiramate  er   Review of Systems Review of Systems  Per HPI  Physical Exam Triage Vital Signs ED Triage Vitals  Encounter Vitals Group     BP      Girls Systolic BP Percentile      Girls Diastolic BP Percentile      Boys Systolic BP Percentile      Boys Diastolic BP Percentile      Pulse      Resp      Temp      Temp src      SpO2      Weight      Height      Head Circumference      Peak Flow      Pain Score      Pain Loc      Pain Education      Exclude from Growth Chart    No data found.  Updated Vital Signs BP (!) 168/87 (BP Location: Right Arm)   Pulse (!) 110   Temp 98 F (36.7 C) (Oral)   Resp 17   LMP 06/10/2024 (Approximate)   SpO2 97%   Visual Acuity Right Eye Distance:   Left Eye Distance:   Bilateral Distance:    Right Eye Near:   Left Eye Near:    Bilateral Near:     Physical Exam Vitals and nursing note reviewed.  Constitutional:      Appearance: Normal appearance.  Neurological:     Mental Status: She is alert.      UC Treatments / Results  Labs (all labs ordered are listed, but only abnormal results are displayed) Labs Reviewed  AEROBIC CULTURE W GRAM STAIN (SUPERFICIAL SPECIMEN)    EKG   Radiology No results found.  Procedures Procedures (including critical care time)  Medications Ordered in UC Medications - No data to display  Initial Impression / Assessment and Plan / UC Course  I have reviewed the triage vital signs and the nursing notes.  Pertinent labs & imaging results that were available during my care of the patient were reviewed by me and considered in my medical decision making (see chart for details).  Vitals and triage  reviewed, patient is hemodynamically stable.  Chaperone present for exam reveals  1 cm x 1 cm draining abscess to the right gluteal cleft.  Area is tender.  Wound culture sent.  Encouraged to continue with Augmentin .  Symptomatic management discussed.  Plan of care, follow-up care return precautions given, no questions at this time.     Final Clinical Impressions(s) / UC Diagnoses   Final diagnoses:  Abscess     Discharge Instructions      Continue taking Augmentin  with food and yogurt.  We are sending a culture off and we will contact you if we need to modify your antibiotics.  I am glad that the area opened up and drained on its own, continue with warm compresses.  Consider placing Hibiclens on a washcloth and cleaning high risk areas such as axilla, under the breasts and the groin area daily when you shower to help prevent bacteria buildup of the skin.  Hibiclens is available over-the-counter at most local stores.  For pain or discomfort you can alternate between Tylenol  and ibuprofen .  The abscess should continue to diminish in size and pain over the next few days with antibiotics.  If no improvement or any changes follow-up with primary care provider or return to clinic for reevaluation.     ED Prescriptions   None    PDMP not reviewed this encounter.   Dreama, Alaina Donati  N, FNP 06/27/24 1704

## 2024-06-27 NOTE — Discharge Instructions (Addendum)
 Continue taking Augmentin  with food and yogurt.  We are sending a culture off and we will contact you if we need to modify your antibiotics.  I am glad that the area opened up and drained on its own, continue with warm compresses.  Consider placing Hibiclens on a washcloth and cleaning high risk areas such as axilla, under the breasts and the groin area daily when you shower to help prevent bacteria buildup of the skin.  Hibiclens is available over-the-counter at most local stores.  For pain or discomfort you can alternate between Tylenol  and ibuprofen .  The abscess should continue to diminish in size and pain over the next few days with antibiotics.  If no improvement or any changes follow-up with primary care provider or return to clinic for reevaluation.

## 2024-06-27 NOTE — Progress Notes (Signed)
 Virtual Visit via Video Note I connected with Alison Brock on 06/27/24 by a video enabled telemedicine application and verified that I am speaking with the correct person using two identifiers. Location patient: home Location provider:work office Persons participating in the virtual visit: patient, provider  I discussed the limitations of evaluation and management by telemedicine and the availability of in person appointments. The patient expressed understanding and agreed to proceed.  Chief Complaint  Patient presents with   Cyst    Pt reports boil on buttock that got ruptured this morning. Sx going a wk ago. No treatment. Took ibuprofen  for pain. Wash with soap and water.    Discussed the use of AI scribe software for clinical note transcription with the patient, who gave verbal consent to proceed.  History of Present Illness Alison Brock is a 23 year old female with past medical history significant for anxiety/depression, epilepsy, and migraine headaches who is being seen today via telemedicine with a boil on the right buttock as described above. She is accompanied by her mother.  She developed a tender lesion on her right buttock, specifically in the gluteal cleft, which ruptured this morning. The symptoms began a week ago, initially resembling a pimple, and worsened over the week, becoming particularly painful by Friday evening. She has a history of boils in the groin area but not on the buttock.  Upon rupture, the pain did not immediately subside as it typically does with her previous boils. The pain was severe upon sitting and improved slightly after lying on her stomach. She has been using gauze to manage the drainage and has been cleaning the area with soap and water. She has not used any over-the-counter treatments specifically for the boil, but has taken ibuprofen  for pain relief.  No fever, chills, or body aches. She reports experiencing nausea, which she attributes  to anxiety related to the boil. She has a known allergy to doxycycline and sulfas.  Her mother has been assisting with cleaning the area.  ROS: See pertinent positives and negatives per HPI.  Past Medical History:  Diagnosis Date   Anxiety    Seizures (HCC)     Past Surgical History:  Procedure Laterality Date   GUM SURGERY  03/2016    Family History  Problem Relation Age of Onset   Hypertension Mother    Uterine cancer Mother    Drug abuse Father    Alcohol abuse Father    Bipolar disorder Maternal Aunt    Anxiety disorder Maternal Aunt    Hypertension Maternal Grandmother    Hypertension Paternal Grandfather    Depression Paternal Grandfather     Social History   Socioeconomic History   Marital status: Single    Spouse name: Not on file   Number of children: Not on file   Years of education: Not on file   Highest education level: 11th grade  Occupational History   Not on file  Tobacco Use   Smoking status: Every Day    Types: E-cigarettes    Start date: 08/20/2021    Passive exposure: Yes   Smokeless tobacco: Never  Vaping Use   Vaping status: Every Day   Start date: 10/21/2019   Substances: Nicotine   Devices: Vuse  Substance and Sexual Activity   Alcohol use: No   Drug use: No   Sexual activity: Yes    Birth control/protection: Condom, Pill  Other Topics Concern   Not on file  Social History Narrative   Lives  with her grandmother.   1 dog   [name] Vapes   Social Drivers of Health   Financial Resource Strain: High Risk (06/27/2024)   Overall Financial Resource Strain (CARDIA)    Difficulty of Paying Living Expenses: Very hard  Food Insecurity: No Food Insecurity (06/27/2024)   Hunger Vital Sign    Worried About Running Out of Food in the Last Year: Never true    Ran Out of Food in the Last Year: Never true  Transportation Needs: No Transportation Needs (06/27/2024)   PRAPARE - Administrator, Civil Service (Medical): No    Lack of  Transportation (Non-Medical): No  Physical Activity: Inactive (06/27/2024)   Exercise Vital Sign    Days of Exercise per Week: 0 days    Minutes of Exercise per Session: Not on file  Stress: Stress Concern Present (06/27/2024)   [employer] of Occupational Health - Occupational Stress Questionnaire    Feeling of Stress: Very much  Social Connections: Socially Isolated (06/27/2024)   Social Connection and Isolation Panel    Frequency of Communication with Friends and Family: More than three times a week    Frequency of Social Gatherings with Friends and Family: Once a week    Attends Religious Services: Never    Database administrator or Organizations: No    Attends Engineer, structural: Not on file    Marital Status: Never married  Intimate Partner Violence: Unknown (06/02/2019)   Humiliation, Afraid, Rape, and Kick questionnaire    Fear of Current or Ex-Partner: Patient declined    Emotionally Abused: Patient declined    Physically Abused: Patient declined    Sexually Abused: Patient declined    Current Outpatient Medications:    amitriptyline  (ELAVIL ) 25 MG tablet, TAKE TWO TABLETS BY MOUTH EVERY NIGHT AT BEDTIME, Disp: 60 tablet, Rfl: 5   amoxicillin -clavulanate (AUGMENTIN ) 875-125 MG tablet, Take 1 tablet by mouth 2 (two) times daily for 7 days., Disp: 14 tablet, Rfl: 0   busPIRone  (BUSPAR ) 15 MG tablet, Take 20 mg by mouth 3 (three) times daily., Disp: , Rfl:    levETIRAcetam  (KEPPRA  XR) 750 MG 24 hr tablet, TAKE 2 TABLETS BY MOUTH EVERY MORNING AND TAKE TWO TABLETS BY MOUTH EVERY NIGHT AT BEDTIME, Disp: 120 tablet, Rfl: 5   LORazepam  (ATIVAN ) 0.5 MG tablet, Take 1/2 tablet 3 times per day for anxiety (Patient taking differently: Take 3/4 tablet 3 times per day for anxiety), Disp: 10 tablet, Rfl: 0   LOW-OGESTREL  0.3-30 MG-MCG tablet, Take 1 tablet by mouth daily., Disp: 90 tablet, Rfl: 3   Magnesium  Oxide 500 MG TABS, Take 500 mg by mouth daily. , Disp: , Rfl:     pyridOXINE  (VITAMIN B-6) 100 MG tablet, Take 100 mg by mouth daily., Disp: , Rfl:    riboflavin  (VITAMIN B-2) 100 MG TABS tablet, Take 100 mg by mouth daily., Disp: , Rfl:    fluticasone  (FLONASE ) 50 MCG/ACT nasal spray, Place 2 sprays into both nostrils daily. (Patient not taking: Reported on 06/27/2024), Disp: 16 g, Rfl: 0  EXAM:  VITALS per patient if applicable:LMP 06/10/2024 (Approximate)   GENERAL: alert, oriented, appears well and in no acute distress  HEENT: atraumatic, conjunctiva clear, no obvious abnormalities on inspection of external nose and ears  NECK: normal movements of the head and neck  LUNGS: on inspection no signs of respiratory distress, breathing rate appears normal, no obvious gross SOB, gasping or wheezing  CV: no obvious cyanosis  MS: moves  all visible extremities without noticeable abnormality SKIN inner aspect of right gluteous with area of erythema, around 4 cm with central area with traces of blood.    PSYCH/NEURO: pleasant and cooperative, no obvious depression or anxiety, speech and thought processing grossly intact  ASSESSMENT AND PLAN:  Discussed the following assessment and plan:  Abscess -     Amoxicillin -Pot Clavulanate; Take 1 tablet by mouth 2 (two) times daily for 7 days.  Dispense: 14 tablet; Refill: 0  Closed to perianal area, no prior hx so for now I do not think surgical consultation is necessary unless problem does not resolve or this becomes recurrent in the same area. We discussed treatment option, lesion is already draining, recommend sitz bath for 10 to 15 minutes with warm water and Epsom salt a.m. and p.m. Augmentin  875-125 mg bid x 7 d recommended.Some side effect discussed, taking a daily probiotic may help prevent diarrhea. Continue OTC ibuprofen  400 mg 3 times daily with food for pain management. Follow-up with PCP in 5 days, before if problem gets worse. She was clearly instructed about warning signs, she and her mother voiced  understanding.  We discussed possible serious and likely etiologies, options for evaluation and workup, limitations of telemedicine visit vs in person visit, treatment, treatment risks and precautions. The patient was advised to call back or seek an in-person evaluation if the symptoms worsen or if the condition fails to improve as anticipated. I discussed the assessment and treatment plan with the patient. The patient was provided an opportunity to ask questions and all were answered. The patient agreed with the plan and demonstrated an understanding of the instructions.  Return in about 4 days (around 07/01/2024).  Consepcion Utt [place], MD

## 2024-06-27 NOTE — ED Triage Notes (Signed)
 Pt presents with abscess on buttocks that appeared last Monday and has gotten worse. Abscess popped yesterday.  Pt did e visit yesterday as well and started Augmentin  this morning. Presents today due to decrease drainage and increased pain.

## 2024-06-27 NOTE — ED Notes (Signed)
 Pt left before being triage due to anxiety. Mother stated they would come back later.

## 2024-06-30 LAB — AEROBIC CULTURE W GRAM STAIN (SUPERFICIAL SPECIMEN): Culture: NO GROWTH

## 2024-07-01 ENCOUNTER — Ambulatory Visit (HOSPITAL_COMMUNITY): Payer: Self-pay

## 2024-07-04 DIAGNOSIS — F411 Generalized anxiety disorder: Secondary | ICD-10-CM | POA: Diagnosis not present

## 2024-07-04 DIAGNOSIS — F4312 Post-traumatic stress disorder, chronic: Secondary | ICD-10-CM | POA: Diagnosis not present

## 2024-07-04 DIAGNOSIS — F331 Major depressive disorder, recurrent, moderate: Secondary | ICD-10-CM | POA: Diagnosis not present

## 2024-07-04 DIAGNOSIS — F4001 Agoraphobia with panic disorder: Secondary | ICD-10-CM | POA: Diagnosis not present

## 2024-07-05 DIAGNOSIS — F411 Generalized anxiety disorder: Secondary | ICD-10-CM | POA: Diagnosis not present

## 2024-07-07 DIAGNOSIS — F331 Major depressive disorder, recurrent, moderate: Secondary | ICD-10-CM | POA: Diagnosis not present

## 2024-07-07 DIAGNOSIS — F411 Generalized anxiety disorder: Secondary | ICD-10-CM | POA: Diagnosis not present

## 2024-07-07 DIAGNOSIS — F4001 Agoraphobia with panic disorder: Secondary | ICD-10-CM | POA: Diagnosis not present

## 2024-07-07 DIAGNOSIS — F4312 Post-traumatic stress disorder, chronic: Secondary | ICD-10-CM | POA: Diagnosis not present

## 2024-07-08 ENCOUNTER — Other Ambulatory Visit (INDEPENDENT_AMBULATORY_CARE_PROVIDER_SITE_OTHER): Payer: Self-pay | Admitting: Family

## 2024-07-08 DIAGNOSIS — R519 Headache, unspecified: Secondary | ICD-10-CM

## 2024-07-09 ENCOUNTER — Telehealth: Payer: Self-pay

## 2024-07-09 NOTE — Telephone Encounter (Signed)
 Pt returned pt's phone call at this time. Name and DOB verified. Pt was recently seen here on 9/8. States the Augmentin  that was prescribed by telehealth provider was finished on Sunday 9/14. Began to have loose stools four days after completing. Felt dizzy and tired more than usual yesterday. Pt is not sure what to do. This RN advised pt she would need to be re-seen in-clinic. Hours of operation provided. If symptoms worsen tonight, advised pt to go to ED. Pt verbalized understanding.

## 2024-07-11 DIAGNOSIS — F411 Generalized anxiety disorder: Secondary | ICD-10-CM | POA: Diagnosis not present

## 2024-07-11 DIAGNOSIS — F331 Major depressive disorder, recurrent, moderate: Secondary | ICD-10-CM | POA: Diagnosis not present

## 2024-07-11 DIAGNOSIS — F4001 Agoraphobia with panic disorder: Secondary | ICD-10-CM | POA: Diagnosis not present

## 2024-07-11 DIAGNOSIS — F4312 Post-traumatic stress disorder, chronic: Secondary | ICD-10-CM | POA: Diagnosis not present

## 2024-07-14 DIAGNOSIS — F331 Major depressive disorder, recurrent, moderate: Secondary | ICD-10-CM | POA: Diagnosis not present

## 2024-07-14 DIAGNOSIS — F4001 Agoraphobia with panic disorder: Secondary | ICD-10-CM | POA: Diagnosis not present

## 2024-07-14 DIAGNOSIS — F410 Panic disorder [episodic paroxysmal anxiety] without agoraphobia: Secondary | ICD-10-CM | POA: Diagnosis not present

## 2024-07-16 ENCOUNTER — Telehealth: Admitting: Family

## 2024-07-16 DIAGNOSIS — B3731 Acute candidiasis of vulva and vagina: Secondary | ICD-10-CM

## 2024-07-16 DIAGNOSIS — N898 Other specified noninflammatory disorders of vagina: Secondary | ICD-10-CM | POA: Diagnosis not present

## 2024-07-16 MED ORDER — FLUCONAZOLE 150 MG PO TABS: 150.0000 mg | ORAL_TABLET | ORAL | 0 refills | Status: DC | PRN

## 2024-07-16 NOTE — Progress Notes (Signed)
 Virtual Visit Consent   Alison Brock, you are scheduled for a virtual visit with a [facility] provider today. Just as with appointments in the office, your consent must be obtained to participate. Your consent will be active for this visit and any virtual visit you may have with one of our providers in the next 365 days. If you have a MyChart account, a copy of this consent can be sent to you electronically.  As this is a virtual visit, video technology does not allow for your provider to perform a traditional examination. This may limit your provider's ability to fully assess your condition. If your provider identifies any concerns that need to be evaluated in person or the need to arrange testing (such as labs, EKG, etc.), we will make arrangements to do so. Although advances in technology are sophisticated, we cannot ensure that it will always work on either your end or our end. If the connection with a video visit is poor, the visit may have to be switched to a telephone visit. With either a video or telephone visit, we are not always able to ensure that we have a secure connection.  By engaging in this virtual visit, you consent to the provision of healthcare and authorize for your insurance to be billed (if applicable) for the services provided during this visit. Depending on your insurance coverage, you may receive a charge related to this service.  I need to obtain your verbal consent now. Are you willing to proceed with your visit today? Alison Brock has provided verbal consent on 07/16/2024 for a virtual visit (video or telephone). Bari Learn, FNP  Date: 07/16/2024 3:32 PM   Virtual Visit via Video Note   I, Bari Learn, connected with  Alison Brock  ([id number], [birth year]) on 07/16/24 at  3:30 PM EDT by a video-enabled telemedicine application and verified that I am speaking with the correct person using two identifiers.  Location: Patient: Virtual Visit Location  Patient: Home Provider: Virtual Visit Location Provider: Home Office   I discussed the limitations of evaluation and management by telemedicine and the availability of in person appointments. The patient expressed understanding and agreed to proceed.    History of Present Illness: Alison Brock is a 23 y.o. who identifies as a female who was assigned female at birth, and is being seen today for vaginal itching. Was on antibiotics twice over the last month.   HPI: Vaginal Itching The patient's primary symptoms include genital itching. The patient's pertinent negatives include no genital odor or vaginal discharge. Associated symptoms comments: Burning, erythemas of labia. She has tried nothing for the symptoms. The treatment provided no relief.    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   Seizure disorder (HCC) 06/26/2016   AP (abdominal pain) 06/02/2016   Loss of weight 06/02/2016   Constipation 06/02/2016    Allergies:  Allergies  Allergen Reactions   Sulfamethoxazole-Trimethoprim Nausea And Vomiting   Bentyl  [Dicyclomine  Hcl]    Doxycycline Other (See Comments)    Nausea and hallucination  doxycycline   Grapefruit Extract Other (See Comments)    Pt is not supposed to eat grapefruit with medication    Other     Seasonal Allergies - Spring and Fall   Topiramate  Other (See Comments)    Topamax    Topiramate  Er    Medications:  Current Outpatient Medications:     fluconazole  (DIFLUCAN ) 150 MG tablet, Take 1 tablet (150 mg total) by mouth every three (3) days as needed., Disp: 3 tablet, Rfl: 0   amitriptyline  (ELAVIL ) 25 MG tablet, TAKE 2 TABLETS BY MOUTH EVERY NIGHT AT BEDTIME, Disp: 60 tablet, Rfl: 5   busPIRone  (BUSPAR ) 15 MG tablet, Take 20 mg by mouth 3 (three) times daily., Disp: , Rfl:    fluticasone  (FLONASE ) 50 MCG/ACT nasal spray, Place 2 sprays into both nostrils daily. (Patient not taking: Reported on 06/27/2024), Disp: 16 g, Rfl: 0   levETIRAcetam  (KEPPRA  XR) 750 MG 24 hr tablet, TAKE 2 TABLETS BY MOUTH EVERY MORNING AND TAKE TWO TABLETS BY MOUTH EVERY NIGHT AT BEDTIME, Disp: 120 tablet, Rfl: 5   LORazepam  (ATIVAN ) 0.5 MG tablet, Take 1/2 tablet 3 times per day for anxiety (Patient taking differently: Take 3/4 tablet 3 times per day for anxiety), Disp: 10 tablet, Rfl: 0   LOW-OGESTREL  0.3-30 MG-MCG tablet, Take 1 tablet by mouth daily., Disp: 90 tablet, Rfl: 3   Magnesium  Oxide 500 MG TABS, Take 500 mg by mouth daily. , Disp: , Rfl:    pyridOXINE  (VITAMIN B-6) 100 MG tablet, Take 100 mg by mouth daily., Disp: , Rfl:    riboflavin  (VITAMIN B-2) 100 MG TABS tablet, Take 100 mg by mouth daily., Disp: , Rfl:   Observations/Objective: Patient is well-developed, well-nourished in no acute distress.  Resting comfortably  at home.  Head is normocephalic, atraumatic.  No labored breathing.  Speech is clear and coherent with logical content.  Patient is alert and oriented at baseline.    Assessment and Plan: 1. Vagina, candidiasis (Primary) - fluconazole  (DIFLUCAN ) 150 MG tablet; Take 1 tablet (150 mg total) by mouth every three (3) days as needed.  Dispense: 3 tablet; Refill: 0  2. Vagina itching  Probiotic  Avoid scratching Start diflucan   Follow up if symptoms worsen or do not improve   Follow Up Instructions: I discussed the assessment and treatment plan with the patient. The patient was provided an opportunity to ask questions and all  were answered. The patient agreed with the plan and demonstrated an understanding of the instructions.  A copy of instructions were sent to the patient via MyChart unless otherwise noted below.    The patient was advised to call back or seek an in-person evaluation if the symptoms worsen or if the condition fails to improve as anticipated.    Bari Learn, FNP

## 2024-07-18 ENCOUNTER — Other Ambulatory Visit: Payer: Self-pay

## 2024-07-18 ENCOUNTER — Ambulatory Visit
Admission: EM | Admit: 2024-07-18 | Discharge: 2024-07-18 | Disposition: A | Discharge status: Home / Self Care | Attending: Physician Assistant | Admitting: Physician Assistant

## 2024-07-18 DIAGNOSIS — F4001 Agoraphobia with panic disorder: Secondary | ICD-10-CM | POA: Insufficient documentation

## 2024-07-18 DIAGNOSIS — N898 Other specified noninflammatory disorders of vagina: Secondary | ICD-10-CM | POA: Diagnosis not present

## 2024-07-18 DIAGNOSIS — N949 Unspecified condition associated with female genital organs and menstrual cycle: Secondary | ICD-10-CM | POA: Diagnosis not present

## 2024-07-18 MED ORDER — NYSTATIN 100000 UNIT/GM EX CREA: TOPICAL_CREAM | CUTANEOUS | 0 refills | Status: DC

## 2024-07-18 NOTE — ED Triage Notes (Signed)
 Pt presents with complaints of vaginal itching x 3 days. States she is having discomfort when wiping. Currently denies pain while sitting in triage.  Telehealth visit on Saturday. One dose of diflucan  taken. Pt reports this made her itching worse.

## 2024-07-18 NOTE — ED Provider Notes (Signed)
 [facility]    CSN: [id number] Arrival date & time: 07/18/24  0810      History   Chief Complaint Chief Complaint  Patient presents with   Vaginal Itching    HPI Alison Brock is a 23 y.o. female.   HPI  Pt states she thinks she has a yeast infection  She reports having vulvovaginal irritation. She states this has been ongoing since Thurs  She states her vulva are painful and she had difficulty with inserting cervicovaginal swab to provide sample.   LMP was a week ago  She did a telehealth video on Sat and started Diflucan  but this has not provided significant relief   Past Medical History:  Diagnosis Date   Anxiety    Seizures (HCC)     Patient Active Problem List   Diagnosis Date Noted   Depression 06/03/2024   Psychogenic nonepileptic seizure 06/03/2024   Cyst of subcutaneous tissue 03/07/2024   Left foot pain 03/07/2022   Encounter for long-term current use of medication 11/18/2020   Pyelonephritis 06/02/2019   Migraine variants, not intractable 02/09/2019   Panic disorder 12/25/2018   Social anxiety disorder 10/06/2018   Frequent headaches 07/08/2016   Anxiety 06/26/2016   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 Authorizing Provider  nystatin cream (MYCOSTATIN) Apply to affected area 2 times daily 07/18/24  Yes Raidyn Breiner E, PA-C  amitriptyline  (ELAVIL ) 25 MG tablet TAKE 2 TABLETS BY MOUTH EVERY NIGHT AT  BEDTIME 07/08/24   Marianna City, NP  busPIRone  (BUSPAR ) 15 MG tablet Take 20 mg by mouth 3 (three) times daily. 12/15/18   [provider]  fluconazole  (DIFLUCAN ) 150 MG tablet Take 1 tablet (150 mg total) by mouth every three (3) days as needed. 07/16/24   Lavell Bari LABOR, FNP  fluticasone  (FLONASE ) 50 MCG/ACT nasal spray Place 2 sprays into both nostrils daily. Patient not taking: Reported on 06/27/2024 03/12/24   Enedelia Dorna HERO, FNP  levETIRAcetam  (KEPPRA  XR) 750 MG 24 hr tablet TAKE 2 TABLETS BY MOUTH EVERY MORNING AND TAKE TWO TABLETS BY MOUTH EVERY NIGHT AT BEDTIME 06/21/24   Marianna City, NP  LORazepam  (ATIVAN ) 0.5 MG tablet Take 1/2 tablet 3 times per day for anxiety Patient taking differently: Take 3/4 tablet 3 times per day for anxiety 05/29/22   Marianna City, NP  LOW-OGESTREL  0.3-30 MG-MCG tablet Take 1 tablet by mouth daily. 03/07/24   Nche, Roselie Rockford, NP  Magnesium  Oxide 500 MG TABS Take 500 mg by mouth daily.     [provider]  pyridOXINE  (VITAMIN B-6) 100 MG tablet Take 100 mg by mouth daily.    [provider]  riboflavin  (VITAMIN B-2) 100 MG TABS tablet Take 100 mg by mouth daily.    [provider]    Family History Family History  Problem Relation Age of Onset   Hypertension Mother    Uterine cancer Mother    Drug abuse Father    Alcohol abuse Father    Bipolar disorder Maternal Aunt    Anxiety disorder Maternal Aunt    Hypertension Maternal Grandmother    Hypertension Paternal Grandfather    Depression Paternal Grandfather     Social History Social History   Tobacco Use   Smoking status: Every Day    Types: E-cigarettes    Start date: 08/20/2021    Passive exposure: Yes   Smokeless tobacco: Never  Vaping Use   Vaping status: Every Day   Start date: 10/21/2019   Substances: Nicotine   Devices: Vuse  Substance Use Topics   Alcohol use: No   Drug use: No     Allergies   Sulfamethoxazole-trimethoprim,  Bentyl  [dicyclomine  hcl], Doxycycline, Grapefruit extract, Other, Topiramate , and Topiramate  er   Review of Systems Review of Systems  Genitourinary:  Positive for dysuria (burns the surrounding skin with urination) and vaginal pain. Negative for vaginal bleeding and vaginal discharge.     Physical Exam Triage Vital Signs ED Triage Vitals  Encounter Vitals Group     BP --      Girls Systolic BP Percentile --      Girls Diastolic BP Percentile --      Boys Systolic BP Percentile --      Boys Diastolic BP Percentile --      Pulse Rate 07/18/24 0824 (!) 130     Resp 07/18/24 0824 19     Temp 07/18/24 0824 (!) 97 F (36.1 C)     Temp Source 07/18/24 0824 Temporal     SpO2 07/18/24 0824 98 %     Weight --      Height --      Head Circumference --      Peak Flow --      Pain Score 07/18/24 0826 0     Pain Loc --      Pain Education --      Exclude from Growth Chart --    No data found.  Updated Vital Signs Pulse (!) 130   Temp (!) 97 F (36.1 C) (Temporal) Comment (Src): pt has fan in triage. states she is anxious and sweating.  Resp 19   Ht 5' 3 (1.6 m)   Wt 282 lb 3 oz (128 kg)   LMP  (Within Weeks)   SpO2 98%   BMI 49.99 kg/m   Visual Acuity Right Eye Distance:   Left Eye Distance:   Bilateral Distance:    Right Eye Near:   Left Eye Near:    Bilateral Near:     Physical Exam Vitals reviewed.  Constitutional:      General: She is awake.     Appearance: Normal appearance. She is well-developed and well-groomed.  HENT:     Head: Normocephalic and atraumatic.  Eyes:     General: Lids are normal. Gaze aligned appropriately.     Extraocular Movements: Extraocular movements intact.     Conjunctiva/sclera: Conjunctivae normal.  Pulmonary:     Effort: Pulmonary effort is normal.  Neurological:     Mental Status: She is alert and oriented to person, place, and time.  Psychiatric:        Attention and  Perception: Attention and perception normal.        Mood  and Affect: Mood is anxious. Affect is tearful.        Speech: Speech normal.        Behavior: Behavior is agitated. Behavior is not aggressive or combative. Behavior is cooperative.      UC Treatments / Results  Labs (all labs ordered are listed, but only abnormal results are displayed) Labs Reviewed  CERVICOVAGINAL ANCILLARY ONLY    EKG   Radiology No results found.  Procedures Procedures (including critical care time)  Medications Ordered in UC Medications - No data to display  Initial Impression / Assessment and Plan / UC Course  I have reviewed the triage vital signs and the nursing notes.  Pertinent labs & imaging results that were available during my care of the patient were reviewed by me and considered in my medical decision making (see chart for details).     Of note- pt is very anxious and having a panic attack from pre-existing agoraphobia during exam. She was not able to provide a urine sample. Her mother is here with her and is helping with HPI.  Patient did have to leave the clinic for a few minutes to calm down in the car before returning to a different room.  Patient was very agitated during provider encounter and was very eager to leave the clinic soon as possible due to her panic attack   Final Clinical Impressions(s) / UC Diagnoses   Final diagnoses:  Vaginal discomfort   Patient presents today with concerns for vaginal discomfort.  She states this been ongoing since last week and was seen via televisit for the symptoms.  She was prescribed Diflucan  for presumptive yeast infection and states that she took first dose on Saturday but this did not provide significant relief.  Cervicovaginal swab collected to assess for BV, yeast, trichomoniasis, gonorrhea, chlamydia.  I suspect she likely has vulvovaginal yeast infection and recommend continued use of Diflucan  and will send nystatin cream in for relief of external affected areas.  Results of cervicovaginal  swab to dictate further management.  Follow-up as needed for progressing or persistent symptoms    Discharge Instructions      You are seen today for concerns of vulvovaginal discomfort and vaginal discharge changes.  Your symptoms appear consistent with a likely yeast infection so we have collected a cervicovaginal swab to assess for BV, yeast, trichomonas, gonorrhea and chlamydia.  We will keep you updated on the results of your cervicovaginal swab once the results are available.  If any other medication or treatment is indicated by those results that will be sent into the pharmacy that we have on file. Please make sure that you are practicing safe sex and using barrier methods to prevent exposure. It is recommended to avoid intercourse until you have the results back from testing and have completed any treatments that are sent in for you.   To assist with your symptoms while we are waiting on your test results please continue with your Diflucan  as directed.  This medication treats yeast infections fairly well and has relatively low side effect burden.  To assist with any external irritation I am sending in a cream called nystatin.  This medication helps to treat yeast infections of the skin.  Please do not use this inside the vagina as it can cause further irritation.      ED Prescriptions     Medication Sig Dispense Auth. Provider  nystatin cream (MYCOSTATIN) Apply to affected area 2 times daily 30 g Jony Ladnier E, PA-C      PDMP not reviewed this encounter.   Marylene Rocky BRAVO, PA-C 07/18/24 [id number]

## 2024-07-18 NOTE — ED Notes (Signed)
 Pt was unable to provide a urine sample due to panic attack. Erin, PA made aware and was okay preceding without these tests.

## 2024-07-18 NOTE — ED Notes (Signed)
 Pt began to have a panic attack mid-triage. Mother was at bedside to help calm her down. Instructed to take slow, deep breaths. Has ice packs on body brought from home. Erin PA in room.

## 2024-07-18 NOTE — ED Notes (Signed)
 Pt moved to room 5. Felt enclosed in room 3.

## 2024-07-18 NOTE — Discharge Instructions (Addendum)
 You are seen today for concerns of vulvovaginal discomfort and vaginal discharge changes.  Your symptoms appear consistent with a likely yeast infection so we have collected a cervicovaginal swab to assess for BV, yeast, trichomonas, gonorrhea and chlamydia.  We will keep you updated on the results of your cervicovaginal swab once the results are available.  If any other medication or treatment is indicated by those results that will be sent into the pharmacy that we have on file. Please make sure that you are practicing safe sex and using barrier methods to prevent exposure. It is recommended to avoid intercourse until you have the results back from testing and have completed any treatments that are sent in for you.   To assist with your symptoms while we are waiting on your test results please continue with your Diflucan  as directed.  This medication treats yeast infections fairly well and has relatively low side effect burden.  To assist with any external irritation I am sending in a cream called nystatin.  This medication helps to treat yeast infections of the skin.  Please do not use this inside the vagina as it can cause further irritation.

## 2024-07-19 ENCOUNTER — Telehealth: Payer: Self-pay

## 2024-07-19 DIAGNOSIS — B9689 Other specified bacterial agents as the cause of diseases classified elsewhere: Secondary | ICD-10-CM

## 2024-07-19 LAB — CERVICOVAGINAL ANCILLARY ONLY
Bacterial Vaginitis (gardnerella): POSITIVE — AB
Candida Glabrata: NEGATIVE
Candida Vaginitis: POSITIVE — AB
Chlamydia: NEGATIVE
Comment: NEGATIVE
Comment: NEGATIVE
Comment: NEGATIVE
Comment: NEGATIVE
Comment: NEGATIVE
Comment: NORMAL
Neisseria Gonorrhea: NEGATIVE
Trichomonas: NEGATIVE

## 2024-07-19 NOTE — Telephone Encounter (Signed)
 This RN returned patient's phone call at this time. Name and DOB verified. Pt states she saw her cytology results. Requesting treatment for bacterial vaginosis and yeast. Took second dose of Diflucan  today. Has one more pill left of the Diflucan . Unsure if she will need more. Stated this would be up to the provider. Overall symptoms are improving per pt. Pt wanting treatment medications called in to pharmacy on file. This information routed to provider, Rocky Mecum PA.

## 2024-07-20 ENCOUNTER — Ambulatory Visit (HOSPITAL_COMMUNITY): Payer: Self-pay

## 2024-07-20 ENCOUNTER — Telehealth (INDEPENDENT_AMBULATORY_CARE_PROVIDER_SITE_OTHER): Payer: Self-pay | Admitting: Family

## 2024-07-20 ENCOUNTER — Telehealth: Payer: Self-pay

## 2024-07-20 ENCOUNTER — Telehealth: Payer: Self-pay | Admitting: Emergency Medicine

## 2024-07-20 DIAGNOSIS — B9689 Other specified bacterial agents as the cause of diseases classified elsewhere: Secondary | ICD-10-CM

## 2024-07-20 MED ORDER — METRONIDAZOLE 0.75 % VA GEL: 1.0000 | Freq: Every day | VAGINAL | 0 refills | Status: DC

## 2024-07-20 MED ORDER — METRONIDAZOLE 500 MG PO TABS: 500.0000 mg | ORAL_TABLET | Freq: Two times a day (BID) | ORAL | 0 refills | Status: DC

## 2024-07-20 NOTE — Telephone Encounter (Signed)
 Pt had called last night about positive BV results. Provider sent in Flagyl for treatment today. Returned call to patient to inform her medication was sent in and is ok to take with other medications per provider.

## 2024-07-20 NOTE — Telephone Encounter (Signed)
 I called and talked to [facility]. She is concerned about potential side effects/interactions of medication prescribed for vaginal infection with the Amitriptyline . I attempted to reassure her and encouraged her to treat the infection as prescribed

## 2024-07-20 NOTE — Telephone Encounter (Signed)
 Patient called stating she is wanting to speak with Ellouise Bollman and wondering if she can give her a call.  Patient would not go into detail.

## 2024-07-20 NOTE — Telephone Encounter (Signed)
 Patient called clinic concerned over medication side effects with amitriptyline  with oral metronidazole.  Patient advised that QT interval prolongation is minimal concern due to her age and minimal concern for cardiac dysfunction.  Metronidazole 0.75% vaginal gel prescribed to preferred pharmacy for treatment of bacterial vaginosis.  If symptoms do not improve with use of oral metronidazole as prescribed.

## 2024-07-21 ENCOUNTER — Encounter: Payer: Self-pay | Admitting: Nurse Practitioner

## 2024-07-21 ENCOUNTER — Telehealth: Admitting: Nurse Practitioner

## 2024-07-21 ENCOUNTER — Telehealth: Payer: Self-pay | Admitting: Nurse Practitioner

## 2024-07-21 DIAGNOSIS — N760 Acute vaginitis: Secondary | ICD-10-CM

## 2024-07-21 DIAGNOSIS — B9689 Other specified bacterial agents as the cause of diseases classified elsewhere: Secondary | ICD-10-CM | POA: Diagnosis not present

## 2024-07-21 DIAGNOSIS — F4312 Post-traumatic stress disorder, chronic: Secondary | ICD-10-CM | POA: Diagnosis not present

## 2024-07-21 DIAGNOSIS — F331 Major depressive disorder, recurrent, moderate: Secondary | ICD-10-CM | POA: Diagnosis not present

## 2024-07-21 DIAGNOSIS — F411 Generalized anxiety disorder: Secondary | ICD-10-CM | POA: Diagnosis not present

## 2024-07-21 MED ORDER — CLINDAMYCIN PHOSPHATE 2 % VA CREA: 1.0000 | TOPICAL_CREAM | Freq: Every day | VAGINAL | 0 refills | Status: DC

## 2024-07-21 NOTE — Telephone Encounter (Signed)
 error

## 2024-07-21 NOTE — Progress Notes (Signed)
 Virtual Visit via Video Note  I connected withNAME@ on 07/21/24 at  8:20 AM EDT by a video enabled telemedicine application and verified that I am speaking with the correct person using two identifiers.  Location: Patient:Home Provider: Office Participants: patient and provider  I discussed the limitations of evaluation and management by telemedicine and the availability of in person appointments. I also discussed with the patient that there may be a patient responsible charge related to this service. The patient expressed understanding and agreed to proceed.  RR:ipdrldd BV treatment  History of Present Illness:  [name] was seen by an urgent care provider on 07/18/2024 and a virtual provider on 07/16/2024 for vaginal discharge and itching. She was diagnosed with candida and bacterial vaginosis. Negative STI. Diflucan , nystatin and metronidazole prescribed. She has taken 2doses of diflucan  tabs and using nystatin cream. She did not take metronidazole gel not tabs due to fear of possible side effects. She is requesting for a different prescription.   Observations/Objective: Physical Exam Vitals and nursing note reviewed.  Neurological:     Mental Status: She is alert and oriented to person, place, and time.  Psychiatric:        Attention and Perception: Attention normal.        Mood and Affect: Mood is anxious.        Speech: Speech normal.        Behavior: Behavior is cooperative.        Cognition and Memory: Cognition normal.     Assessment and Plan: Sharia Averitt was seen today for follow-up.  Diagnoses and all orders for this visit:  BV (bacterial vaginosis) -     clindamycin (CLEOCIN) 2 % vaginal cream; Place 1 Applicatorful vaginally at bedtime.   Follow Up Instructions: Switched metronidazole to clindamycin cream. Advised to stop nystatin cream, start oral probiotic-1cap daily, and abstain from sexual intercourse till symptoms resolve   I discussed the assessment and  treatment plan with the patient. The patient was provided an opportunity to ask questions and all were answered. The patient agreed with the plan and demonstrated an understanding of the instructions.   The patient was advised to call back or seek an in-person evaluation if the symptoms worsen or if the condition fails to improve as anticipated.  Roselie Mood, NP

## 2024-07-25 DIAGNOSIS — F331 Major depressive disorder, recurrent, moderate: Secondary | ICD-10-CM | POA: Diagnosis not present

## 2024-07-25 DIAGNOSIS — F4001 Agoraphobia with panic disorder: Secondary | ICD-10-CM | POA: Diagnosis not present

## 2024-07-25 DIAGNOSIS — F411 Generalized anxiety disorder: Secondary | ICD-10-CM | POA: Diagnosis not present

## 2024-07-25 DIAGNOSIS — F4312 Post-traumatic stress disorder, chronic: Secondary | ICD-10-CM | POA: Diagnosis not present

## 2024-07-26 ENCOUNTER — Encounter: Payer: Self-pay | Admitting: Nurse Practitioner

## 2024-08-01 DIAGNOSIS — F411 Generalized anxiety disorder: Secondary | ICD-10-CM | POA: Diagnosis not present

## 2024-08-01 DIAGNOSIS — F4001 Agoraphobia with panic disorder: Secondary | ICD-10-CM | POA: Diagnosis not present

## 2024-08-01 DIAGNOSIS — F331 Major depressive disorder, recurrent, moderate: Secondary | ICD-10-CM | POA: Diagnosis not present

## 2024-08-01 DIAGNOSIS — F4312 Post-traumatic stress disorder, chronic: Secondary | ICD-10-CM | POA: Diagnosis not present

## 2024-08-04 DIAGNOSIS — F4001 Agoraphobia with panic disorder: Secondary | ICD-10-CM | POA: Diagnosis not present

## 2024-08-04 DIAGNOSIS — F331 Major depressive disorder, recurrent, moderate: Secondary | ICD-10-CM | POA: Diagnosis not present

## 2024-08-04 DIAGNOSIS — F4312 Post-traumatic stress disorder, chronic: Secondary | ICD-10-CM | POA: Diagnosis not present

## 2024-08-04 DIAGNOSIS — F411 Generalized anxiety disorder: Secondary | ICD-10-CM | POA: Diagnosis not present

## 2024-08-08 DIAGNOSIS — F4312 Post-traumatic stress disorder, chronic: Secondary | ICD-10-CM | POA: Diagnosis not present

## 2024-08-08 DIAGNOSIS — F411 Generalized anxiety disorder: Secondary | ICD-10-CM | POA: Diagnosis not present

## 2024-08-08 DIAGNOSIS — F4001 Agoraphobia with panic disorder: Secondary | ICD-10-CM | POA: Diagnosis not present

## 2024-08-08 DIAGNOSIS — F331 Major depressive disorder, recurrent, moderate: Secondary | ICD-10-CM | POA: Diagnosis not present

## 2024-08-10 DIAGNOSIS — F411 Generalized anxiety disorder: Secondary | ICD-10-CM | POA: Diagnosis not present

## 2024-08-15 ENCOUNTER — Telehealth: Payer: Self-pay | Admitting: Nurse Practitioner

## 2024-08-15 DIAGNOSIS — F331 Major depressive disorder, recurrent, moderate: Secondary | ICD-10-CM | POA: Diagnosis not present

## 2024-08-15 DIAGNOSIS — F4312 Post-traumatic stress disorder, chronic: Secondary | ICD-10-CM | POA: Diagnosis not present

## 2024-08-15 DIAGNOSIS — F411 Generalized anxiety disorder: Secondary | ICD-10-CM | POA: Diagnosis not present

## 2024-08-15 DIAGNOSIS — F4001 Agoraphobia with panic disorder: Secondary | ICD-10-CM | POA: Diagnosis not present

## 2024-08-15 DIAGNOSIS — Z3041 Encounter for surveillance of contraceptive pills: Secondary | ICD-10-CM

## 2024-08-15 NOTE — Telephone Encounter (Unsigned)
 Copied from CRM [id number]. Topic: Clinical - Prescription Issue >> Aug 15, 2024  4:37 PM Ashley R wrote: Reason for CRM: LOW-OGESTREL  0.3-30 MG-MCG tablet is no longer being provided by manufacturer, pharmacy was only giving backstock and no other pharmacy has it. Would like to be prescribed another BC, open to having an appt to discuss, being contacted via MyChart.

## 2024-08-16 DIAGNOSIS — F4001 Agoraphobia with panic disorder: Secondary | ICD-10-CM | POA: Diagnosis not present

## 2024-08-16 DIAGNOSIS — F331 Major depressive disorder, recurrent, moderate: Secondary | ICD-10-CM | POA: Diagnosis not present

## 2024-08-16 DIAGNOSIS — F411 Generalized anxiety disorder: Secondary | ICD-10-CM | POA: Diagnosis not present

## 2024-08-16 MED ORDER — LO LOESTRIN FE 1 MG-10 MCG / 10 MCG PO TABS: 1.0000 | ORAL_TABLET | Freq: Every day | ORAL | 1 refills | Status: AC

## 2024-08-17 ENCOUNTER — Encounter: Payer: Self-pay | Admitting: Emergency Medicine

## 2024-08-17 ENCOUNTER — Ambulatory Visit: Admission: EM | Admit: 2024-08-17 | Discharge: 2024-08-17 | Disposition: A | Discharge status: Home / Self Care

## 2024-08-17 DIAGNOSIS — N898 Other specified noninflammatory disorders of vagina: Secondary | ICD-10-CM | POA: Diagnosis not present

## 2024-08-17 LAB — POCT URINE DIPSTICK
Bilirubin, UA: NEGATIVE
Glucose, UA: NEGATIVE mg/dL
Ketones, POC UA: NEGATIVE mg/dL
Nitrite, UA: NEGATIVE
POC PROTEIN,UA: NEGATIVE
Spec Grav, UA: <=1.005 — AB (ref 1.010–1.025)
Urobilinogen, UA: 0.2 U/dL
pH, UA: 6.5 (ref 5.0–8.0)

## 2024-08-17 LAB — POCT URINE PREGNANCY: Preg Test, Ur: NEGATIVE

## 2024-08-17 NOTE — Discharge Instructions (Addendum)
  1. Vaginal discharge (Primary) - POCT urine pregnancy completed in UC is negative - Cervicovaginal swab collected in UC and sent to lab for further testing results should be available in 2 to 3 days. - POCT URINE DIPSTICK completed in UC shows trace leukocytes, trace blood, no nitrite,, these findings are not strongly indicative of urinary infection. -Urine culture collected in UC and sent to lab for further testing results should be available in 2 to 3 days. - Ambulatory referral to Gynecology for follow-up evaluation and management if symptoms do not subside and/or you develop further symptoms. -Continue to monitor symptoms for any change in severity if there is any escalation of current symptoms or development of new symptoms follow-up in ER for further evaluation and management.

## 2024-08-17 NOTE — ED Provider Notes (Signed)
 [facility]  Note:  This document was prepared using Dragon voice recognition software and may include unintentional dictation errors.  MRN: [id number] DOB: [birth year]  Subjective:   Alison Brock is a 23 y.o. female presenting for evaluation of lower abdominal pain mostly on the right-hand side.  Yellow vaginal discharge, and vaginal bleeding and.  Symptoms lasting longer than normal.  Patient states that symptoms began on 10 6 and have continued up through 1024.  Patient reports history of BV and yeast infections last month.  Patient reports that she completed treatment and symptoms did subside.  Patient concern for repeat infection.  No current facility-administered medications for this encounter.  Current Outpatient Medications:    amitriptyline  (ELAVIL ) 25 MG tablet, TAKE 2 TABLETS BY MOUTH EVERY NIGHT AT BEDTIME, Disp: 60 tablet, Rfl: 5   busPIRone  (BUSPAR ) 10 MG tablet, Take 20 mg by mouth 3 (three) times daily., Disp: , Rfl:    clindamycin (CLEOCIN) 2 % vaginal cream, Place 1 Applicatorful vaginally at bedtime., Disp: 40 g, Rfl: 0   fluconazole  (DIFLUCAN ) 150 MG tablet, Take 1 tablet (150 mg total) by mouth every three (3) days as needed. (Patient not taking: Reported on 08/17/2024), Disp: 3 tablet, Rfl: 0   levETIRAcetam  (KEPPRA  XR) 750 MG 24 hr tablet, TAKE 2 TABLETS BY MOUTH EVERY MORNING AND TAKE TWO TABLETS BY MOUTH EVERY NIGHT AT BEDTIME, Disp: 120 tablet, Rfl: 5   LORazepam  (ATIVAN ) 0.5 MG tablet, Take 1/2 tablet 3 times per day for anxiety, Disp: 10 tablet, Rfl: 0   Magnesium  Oxide 500 MG TABS, Take 500 mg by mouth daily. , Disp: , Rfl:    Norethindrone -Ethinyl Estradiol-Fe Biphas (LO LOESTRIN FE) 1 MG-10 MCG / 10 MCG tablet, Take 1 tablet by mouth daily., Disp: 84 tablet, Rfl: 1   pyridOXINE  (VITAMIN B-6) 100 MG tablet, Take 100 mg by mouth daily., Disp: , Rfl:    riboflavin  (VITAMIN B-2) 100 MG TABS tablet, Take 100 mg by mouth daily., Disp: , Rfl:     Allergies  Allergen Reactions   Sulfamethoxazole-Trimethoprim Nausea And Vomiting   Bentyl  [Dicyclomine  Hcl]    Doxycycline Other (See Comments)    Nausea and hallucination  doxycycline   Grapefruit Extract Other (See Comments)    Pt is not supposed to eat grapefruit with medication    Other     Seasonal Allergies - Spring and Fall   Topiramate  Other (See Comments)    Topamax    Topiramate  Er     Past Medical History:  Diagnosis Date   Anxiety    Seizures (HCC)      Past Surgical History:  Procedure Laterality Date   GUM SURGERY  03/2016    Family History  Problem Relation Age of Onset   Hypertension Mother    Uterine cancer Mother    Drug abuse Father    Alcohol abuse Father    Bipolar disorder Maternal Aunt    Anxiety disorder Maternal Aunt    Hypertension Maternal Grandmother    Hypertension Paternal Grandfather    Depression Paternal Grandfather     Social History   Tobacco Use   Smoking status: Every Day    Types: E-cigarettes    Start date: 08/20/2021    Passive exposure: Yes   Smokeless tobacco: Never  Vaping Use   Vaping status: Every Day   Start date: 10/21/2019   Substances: Nicotine   Devices: Vuse  Substance Use Topics   Alcohol use: No   Drug use:  No    ROS Refer to HPI for ROS details.  Objective:   Vitals: BP (!) 161/89 (BP Location: Right Arm)   Pulse (!) 125   Temp 98.2 F (36.8 C) (Oral)   Resp 17   LMP 07/25/2024 (Within Weeks)   SpO2 99%   Physical Exam - Unable to complete physical exam due to patient feeling anxious and leaving urgent care prior to physical exam.   Procedures  Results for orders placed or performed during the hospital encounter of 08/17/24 (from the past 24 hours)  POCT URINE DIPSTICK     Status: Abnormal   Collection Time: 08/17/24  5:55 PM  Result Value Ref Range   Color, UA yellow yellow   Clarity, UA clear clear   Glucose, UA negative negative mg/dL   Bilirubin, UA negative negative    Ketones, POC UA negative negative mg/dL   Spec Grav, UA <=8.994 (A) 1.010 - 1.025   Blood, UA trace-intact (A) negative   pH, UA 6.5 5.0 - 8.0   POC PROTEIN,UA negative negative, trace   Urobilinogen, UA 0.2 0.2 or 1.0 E.U./dL   Nitrite, UA Negative Negative   Leukocytes, UA Trace (A) Negative  POCT urine pregnancy     Status: None   Collection Time: 08/17/24  5:57 PM  Result Value Ref Range   Preg Test, Ur Negative Negative    No results found.   Assessment and Plan :     Discharge Instructions       1. Vaginal discharge (Primary) - POCT urine pregnancy completed in UC is negative - Cervicovaginal swab collected in UC and sent to lab for further testing results should be available in 2 to 3 days. - POCT URINE DIPSTICK completed in UC shows trace leukocytes, trace blood, no nitrite,, these findings are not strongly indicative of urinary infection. -Urine culture collected in UC and sent to lab for further testing results should be available in 2 to 3 days. - Ambulatory referral to Gynecology for follow-up evaluation and management if symptoms do not subside and/or you develop further symptoms. -Continue to monitor symptoms for any change in severity if there is any escalation of current symptoms or development of new symptoms follow-up in ER for further evaluation and management.      Surabhi Gadea B Paighton Godette   Kostantinos Tallman, [place] B, NP 08/17/24 1800

## 2024-08-17 NOTE — ED Triage Notes (Signed)
 Pt c/o lower abdomianal pain mostly on right side, yellowish discharge, and period lasting longer than normal. States symptoms began after last period which was from about 10/6-10/24.  Pt had BV and yeast infection last month that she was treated for

## 2024-08-18 DIAGNOSIS — F4312 Post-traumatic stress disorder, chronic: Secondary | ICD-10-CM | POA: Diagnosis not present

## 2024-08-18 DIAGNOSIS — F331 Major depressive disorder, recurrent, moderate: Secondary | ICD-10-CM | POA: Diagnosis not present

## 2024-08-18 DIAGNOSIS — F411 Generalized anxiety disorder: Secondary | ICD-10-CM | POA: Diagnosis not present

## 2024-08-18 LAB — URINE CULTURE
Culture: NO GROWTH
Special Requests: NORMAL

## 2024-08-19 ENCOUNTER — Ambulatory Visit (HOSPITAL_COMMUNITY): Payer: Self-pay

## 2024-08-19 LAB — CERVICOVAGINAL ANCILLARY ONLY
Bacterial Vaginitis (gardnerella): NEGATIVE
Candida Glabrata: NEGATIVE
Candida Vaginitis: NEGATIVE
Chlamydia: NEGATIVE
Comment: NEGATIVE
Comment: NEGATIVE
Comment: NEGATIVE
Comment: NEGATIVE
Comment: NEGATIVE
Comment: NORMAL
Neisseria Gonorrhea: NEGATIVE
Trichomonas: NEGATIVE

## 2024-08-22 DIAGNOSIS — F4001 Agoraphobia with panic disorder: Secondary | ICD-10-CM | POA: Diagnosis not present

## 2024-08-22 DIAGNOSIS — F4312 Post-traumatic stress disorder, chronic: Secondary | ICD-10-CM | POA: Diagnosis not present

## 2024-08-22 DIAGNOSIS — F411 Generalized anxiety disorder: Secondary | ICD-10-CM | POA: Diagnosis not present

## 2024-08-22 DIAGNOSIS — F331 Major depressive disorder, recurrent, moderate: Secondary | ICD-10-CM | POA: Diagnosis not present

## 2024-08-29 DIAGNOSIS — F4001 Agoraphobia with panic disorder: Secondary | ICD-10-CM | POA: Diagnosis not present

## 2024-08-29 DIAGNOSIS — F411 Generalized anxiety disorder: Secondary | ICD-10-CM | POA: Diagnosis not present

## 2024-08-29 DIAGNOSIS — F4312 Post-traumatic stress disorder, chronic: Secondary | ICD-10-CM | POA: Diagnosis not present

## 2024-08-29 DIAGNOSIS — F331 Major depressive disorder, recurrent, moderate: Secondary | ICD-10-CM | POA: Diagnosis not present

## 2024-08-31 DIAGNOSIS — F411 Generalized anxiety disorder: Secondary | ICD-10-CM | POA: Diagnosis not present

## 2024-09-01 ENCOUNTER — Telehealth: Admitting: Physician Assistant

## 2024-09-01 DIAGNOSIS — U071 COVID-19: Secondary | ICD-10-CM | POA: Diagnosis not present

## 2024-09-01 MED ORDER — BENZONATATE 100 MG PO CAPS: 100.0000 mg | ORAL_CAPSULE | Freq: Three times a day (TID) | ORAL | 0 refills | Status: AC | PRN

## 2024-09-01 MED ORDER — FLUTICASONE PROPIONATE 50 MCG/ACT NA SUSP: 2.0000 | Freq: Every day | NASAL | 0 refills | Status: AC

## 2024-09-01 NOTE — Progress Notes (Signed)
 Your test for COVID-19 was positive, meaning that you were infected with the novel coronavirus and could give the germ to others.    Most people with these infections have a mild illness and can recover at home without medical care. Do not leave your home, except to get medical care.  DO not visit public areas and do not go to places where you are unable to wear a mask. It is important for you to stay home to take care of yourself and to help protect other people in your home and community.   Isolation Instructions:  You are to isolate at home for now until you have taken your home COVID/Flu test and notified our team of your results, at which time further isolation instructions will be given.  If you must be around other household members who do not have symptoms, you need to make sure that both you and the family members are masking consistently with a high-quality mask, even while in the home.  If you note any worsening of symptoms despite treatment, please seek an IN-PERSON evaluation ASAP. If you note any significant shortness of breath or any chest pain, please seek immediate ER evaluation. Please do not delay care!  Go to the nearest hospital ED for assessment if fever/cough/breathlessness are severe or illness seems like a threat to life.    The following symptoms may appear 2-14 days after exposure: Fever Cough Shortness of breath or difficulty breathing Chills Repeated shaking with chills Muscle pain Headache Sore throat New loss of taste or smell Fatigue Congestion or runny nose Nausea or vomiting Diarrhea   For symptoms,  I have prescribed Tessalon Perles 100 mg. You may take 1-2 capsules every 8 hours as needed for cough and I have prescribed Fluticasone  nasal spray 2 sprays in each nostril one time per dayasal spray 2 sprays in each nostril one time per day You may also take acetaminophen  (Tylenol ) as needed for fever.   Reduce your risk of any infection by using the  same precautions used for avoiding the common cold or flu:  Wash your hands often with soap and warm water for at least 20 seconds.  If soap and water are not readily available, use an alcohol-based hand sanitizer with at least 60% alcohol.  If coughing or sneezing, cover your mouth and nose by coughing or sneezing into the elbow areas of your shirt or coat, into a tissue or into your sleeve (not your hands). Avoid shaking hands with others and consider head nods or verbal greetings only. Avoid touching your eyes, nose, or mouth with unwashed hands.  Avoid close contact with people who are sick. Avoid places or events with large numbers of people in one location, like concerts or sporting events. Carefully consider travel plans you have or are making. If you are planning any travel outside or inside the [place] , visit the CDC's Travelers' Health webpage for the latest health notices. If you have some symptoms but not all symptoms, continue to monitor at home and seek medical attention if your symptoms worsen. If you are having a medical emergency, call 911.  HOME CARE Only take medications as instructed by your medical team. Drink plenty of fluids and get plenty of rest. A steam or ultrasonic humidifier can help if you have congestion.   GET HELP RIGHT AWAY IF YOU HAVE EMERGENCY WARNING SIGNS** FOR COVID-19. If you or someone is showing any of these signs seek emergency medical care immediately. Call 911 or proceed  to your closest emergency facility if: You develop worsening high fever. Trouble breathing. Bluish lips or face. Persistent pain or pressure in the chest. New confusion. Inability to wake or stay awake. You cough up blood. Your symptoms become more severe.  **This list is not all possible symptoms. Contact your medical provider for any symptoms that are sever or concerning to you.   MAKE SURE YOU  Understand these instructions. Will watch your condition. Will get help right  away if you are not doing well or get worse.  Your e-visit answers were reviewed by a board certified advanced clinical practitioner to complete your personal care plan.  Depending on the condition, your plan could have included both over the counter or prescription medications.  If there is a problem, please reply once you have received a response from your provider.  Your safety is important to us .  If you have drug allergies check your prescription carefully.    You can use MyChart to ask questions about today's visit, request a non-urgent call back, or ask for a work or school excuse for 24 hours related to this e-Visit. If it has been greater than 24 hours you will need to follow up with your provider, or enter a new e-Visit to address those concerns. You will get an e-mail in the next two days asking about your experience.  I hope that your e-visit has been valuable and will speed your recovery. Thank you for using e-visits.   I have spent 5 minutes in review of e-visit questionnaire, review and updating patient chart, medical decision making and response to patient.   Elsie Velma Lunger, PA-C

## 2024-09-05 DIAGNOSIS — F4001 Agoraphobia with panic disorder: Secondary | ICD-10-CM | POA: Diagnosis not present

## 2024-09-05 DIAGNOSIS — F331 Major depressive disorder, recurrent, moderate: Secondary | ICD-10-CM | POA: Diagnosis not present

## 2024-09-05 DIAGNOSIS — F411 Generalized anxiety disorder: Secondary | ICD-10-CM | POA: Diagnosis not present

## 2024-09-05 DIAGNOSIS — F4312 Post-traumatic stress disorder, chronic: Secondary | ICD-10-CM | POA: Diagnosis not present

## 2024-09-12 DIAGNOSIS — F4312 Post-traumatic stress disorder, chronic: Secondary | ICD-10-CM | POA: Diagnosis not present

## 2024-09-12 DIAGNOSIS — F331 Major depressive disorder, recurrent, moderate: Secondary | ICD-10-CM | POA: Diagnosis not present

## 2024-09-12 DIAGNOSIS — F4001 Agoraphobia with panic disorder: Secondary | ICD-10-CM | POA: Diagnosis not present

## 2024-09-12 DIAGNOSIS — F411 Generalized anxiety disorder: Secondary | ICD-10-CM | POA: Diagnosis not present

## 2024-09-13 DIAGNOSIS — F411 Generalized anxiety disorder: Secondary | ICD-10-CM | POA: Diagnosis not present

## 2024-09-13 DIAGNOSIS — F4001 Agoraphobia with panic disorder: Secondary | ICD-10-CM | POA: Diagnosis not present

## 2024-09-13 DIAGNOSIS — F331 Major depressive disorder, recurrent, moderate: Secondary | ICD-10-CM | POA: Diagnosis not present

## 2024-09-14 DIAGNOSIS — F4001 Agoraphobia with panic disorder: Secondary | ICD-10-CM | POA: Diagnosis not present

## 2024-09-14 DIAGNOSIS — F4312 Post-traumatic stress disorder, chronic: Secondary | ICD-10-CM | POA: Diagnosis not present

## 2024-09-14 DIAGNOSIS — F331 Major depressive disorder, recurrent, moderate: Secondary | ICD-10-CM | POA: Diagnosis not present

## 2024-09-14 DIAGNOSIS — F411 Generalized anxiety disorder: Secondary | ICD-10-CM | POA: Diagnosis not present

## 2024-09-19 DIAGNOSIS — F411 Generalized anxiety disorder: Secondary | ICD-10-CM | POA: Diagnosis not present

## 2024-09-19 DIAGNOSIS — F4001 Agoraphobia with panic disorder: Secondary | ICD-10-CM | POA: Diagnosis not present

## 2024-09-19 DIAGNOSIS — F4312 Post-traumatic stress disorder, chronic: Secondary | ICD-10-CM | POA: Diagnosis not present

## 2024-09-19 DIAGNOSIS — F331 Major depressive disorder, recurrent, moderate: Secondary | ICD-10-CM | POA: Diagnosis not present

## 2024-09-20 ENCOUNTER — Telehealth (INDEPENDENT_AMBULATORY_CARE_PROVIDER_SITE_OTHER): Payer: Self-pay | Admitting: Family

## 2024-09-20 NOTE — Telephone Encounter (Signed)
I called and left a message that I will call back

## 2024-09-20 NOTE — Telephone Encounter (Signed)
 Alison Brock would like a callback from [name] at [phone].

## 2024-09-20 NOTE — Telephone Encounter (Signed)
 I called and spoke with Alison Brock. 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 I explained that the pharmacy will contact this office when they attempt to file a claim with the new insurance, then we will work on getting the PA.

## 2024-09-22 ENCOUNTER — Encounter: Payer: Self-pay | Admitting: Nurse Practitioner

## 2024-09-22 ENCOUNTER — Telehealth: Admitting: Nurse Practitioner

## 2024-09-22 DIAGNOSIS — F331 Major depressive disorder, recurrent, moderate: Secondary | ICD-10-CM

## 2024-09-22 DIAGNOSIS — F4010 Social phobia, unspecified: Secondary | ICD-10-CM

## 2024-09-22 DIAGNOSIS — F410 Panic disorder [episodic paroxysmal anxiety] without agoraphobia: Secondary | ICD-10-CM

## 2024-09-22 NOTE — Assessment & Plan Note (Addendum)
 Currently under the care of [facility] since 2022-[facility]. [facility] since 07/2023 With change in insurance, she needs me to complete prior authorization to maintain current providers who are out of network. 1 [phone]. Member ID [id number] O  New referrals entered.

## 2024-09-22 NOTE — Progress Notes (Signed)
 Virtual Visit via Video Note  I connected withNAME@ on 09/22/24 at  1:20 PM EST by a video enabled telemedicine application and verified that I am speaking with the correct person using two identifiers.  Location: Patient:Home Provider: Office Participants: patient and provider  I discussed the limitations of evaluation and management by telemedicine and the availability of in person appointments. I also discussed with the patient that there may be a patient responsible charge related to this service. The patient expressed understanding and agreed to proceed.  [id number] in insurance and need for prior authorization  History of Present Illness:  Depression Currently under the care of [facility] since 2022-[facility]. [facility] since 07/2023 With change in insurance, she needs me to complete prior authorization to maintain current providers who are out of network. 1 [phone]. Member ID [id number] O  New referrals entered.     09/22/2024    1:26 PM 03/07/2024    3:03 PM 11/18/2022    9:41 AM  Depression screen PHQ 2/9  Decreased Interest 0 2   Down, Depressed, Hopeless 1 2   PHQ - 2 Score 1 4   Altered sleeping 3 3   Tired, decreased energy 3 3   Change in appetite 0 0   Feeling bad or failure about yourself  1 2   Trouble concentrating 0 1   Moving slowly or fidgety/restless 0 0   Suicidal thoughts 0 1   PHQ-9 Score 8 14    Difficult doing work/chores Somewhat difficult Very difficult      Information is confidential and restricted. Go to Review Flowsheets to unlock data.   Data saved with a previous flowsheet row definition       09/22/2024    1:28 PM 03/07/2024    3:03 PM 08/01/2021   10:09 AM  GAD 7 : Generalized Anxiety Score  Nervous, Anxious, on Edge 3 3 2   Control/stop worrying 2 3 1   Worry too much - different things 1 2 1   Trouble relaxing 0 1 2  Restless 0 2 2  Easily annoyed or irritable 0 1 1  Afraid - awful might happen 1 3 1    Total GAD 7 Score 7 15 10   Anxiety Difficulty Very difficult Extremely difficult Somewhat difficult   Observations/Objective: Physical Exam Nursing note reviewed.  Neurological:     Mental Status: She is alert and oriented to person, place, and time.  Psychiatric:        Mood and Affect: Mood normal.        Behavior: Behavior normal.        Thought Content: Thought content normal.     Assessment and Plan: Doreather Hoxworth was seen today for referral.  Diagnoses and all orders for this visit:  Moderate episode of recurrent major depressive disorder [facility]) -     Ambulatory referral to Psychiatry -     Ambulatory referral to Psychology  Social anxiety disorder -     Ambulatory referral to Psychiatry -     Ambulatory referral to Psychology  Panic disorder -     Ambulatory referral to Psychiatry -     Ambulatory referral to Psychology    Follow Up Instructions: See instructions above   I discussed the assessment and treatment plan with the patient. The patient was provided an opportunity to ask questions and all were answered. The patient agreed with the plan and demonstrated an understanding of the instructions.   The patient was advised to  call back or seek an in-person evaluation if the symptoms worsen or if the condition fails to improve as anticipated.  Roselie Mood, NP

## 2024-09-23 NOTE — Addendum Note (Signed)
 Addended by: KATHEEN GARDEN L on: 09/23/2024 10:14 AM   Modules accepted: Orders

## 2024-09-26 DIAGNOSIS — F411 Generalized anxiety disorder: Secondary | ICD-10-CM | POA: Diagnosis not present

## 2024-09-26 DIAGNOSIS — F4001 Agoraphobia with panic disorder: Secondary | ICD-10-CM | POA: Diagnosis not present

## 2024-09-26 DIAGNOSIS — F4312 Post-traumatic stress disorder, chronic: Secondary | ICD-10-CM | POA: Diagnosis not present

## 2024-09-26 DIAGNOSIS — F331 Major depressive disorder, recurrent, moderate: Secondary | ICD-10-CM | POA: Diagnosis not present

## 2024-09-27 ENCOUNTER — Encounter (INDEPENDENT_AMBULATORY_CARE_PROVIDER_SITE_OTHER): Payer: Self-pay

## 2024-09-27 DIAGNOSIS — F411 Generalized anxiety disorder: Secondary | ICD-10-CM | POA: Diagnosis not present

## 2024-09-28 NOTE — Telephone Encounter (Signed)
 I called Alison Brock who said that her current behavioral health providers are not in network with her new insurance that will begin Jan 1st. She is working through her PCP to request an exception but won't know until after the first of the year. She asked if I will prescribe Buspar  and Lorazepam  for her until she gets this worked out with her insurance. I told her that I will do so but only for a short time. She agreed with this plan.

## 2024-10-01 DIAGNOSIS — F411 Generalized anxiety disorder: Secondary | ICD-10-CM | POA: Diagnosis not present

## 2024-10-03 DIAGNOSIS — F331 Major depressive disorder, recurrent, moderate: Secondary | ICD-10-CM | POA: Diagnosis not present

## 2024-10-03 DIAGNOSIS — F4001 Agoraphobia with panic disorder: Secondary | ICD-10-CM | POA: Diagnosis not present

## 2024-10-03 DIAGNOSIS — F411 Generalized anxiety disorder: Secondary | ICD-10-CM | POA: Diagnosis not present

## 2024-10-03 DIAGNOSIS — F4312 Post-traumatic stress disorder, chronic: Secondary | ICD-10-CM | POA: Diagnosis not present

## 2024-10-10 DIAGNOSIS — F4001 Agoraphobia with panic disorder: Secondary | ICD-10-CM | POA: Diagnosis not present

## 2024-10-10 DIAGNOSIS — F4312 Post-traumatic stress disorder, chronic: Secondary | ICD-10-CM | POA: Diagnosis not present

## 2024-10-10 DIAGNOSIS — F411 Generalized anxiety disorder: Secondary | ICD-10-CM | POA: Diagnosis not present

## 2024-10-10 DIAGNOSIS — F331 Major depressive disorder, recurrent, moderate: Secondary | ICD-10-CM | POA: Diagnosis not present

## 2024-10-17 DIAGNOSIS — F4312 Post-traumatic stress disorder, chronic: Secondary | ICD-10-CM | POA: Diagnosis not present

## 2024-10-17 DIAGNOSIS — F411 Generalized anxiety disorder: Secondary | ICD-10-CM | POA: Diagnosis not present

## 2024-10-17 DIAGNOSIS — F331 Major depressive disorder, recurrent, moderate: Secondary | ICD-10-CM | POA: Diagnosis not present

## 2024-10-17 DIAGNOSIS — F4001 Agoraphobia with panic disorder: Secondary | ICD-10-CM | POA: Diagnosis not present

## 2024-10-17 DIAGNOSIS — F410 Panic disorder [episodic paroxysmal anxiety] without agoraphobia: Secondary | ICD-10-CM | POA: Diagnosis not present

## 2024-10-18 NOTE — Telephone Encounter (Signed)
 Copied from CRM [id number]. Topic: Referral - Prior Authorization Question >> Oct 17, 2024 11:22 AM Lonell PEDLAR wrote: Reason for CRM:  Patient called to advise that PA to see psychiatry and psychology [facility] is separate from referral. Patient would like PA or psychiatry to be addressed to Dr. Tinnie Level with [facility] PA for psychology Albino Viktoria Glatter, [facility] [phone]  advised to contact medical provider services number [phone]

## 2024-10-18 NOTE — Telephone Encounter (Signed)
 Called [employer] to provide the provider names below for a authorization request for [facility] providers. Cb# if disconnected: [id number], Select option for behavioral.  [employer] advising that the [facility] psychiatrist and psychologist need to call and provide the procedure code and clinical justification for the need to be seen. Contact [phone].

## 2024-11-07 ENCOUNTER — Ambulatory Visit: Payer: Self-pay

## 2024-11-07 NOTE — Telephone Encounter (Signed)
" °  Pharmacy sevice [phone]   Reason for Triage: Pelvic pain and discomfort. Pain initially began in October but for the past week but has gotten worse. Within the past 24 hours it has worsened more and pt would like to be evaluated.    Reason for Disposition  [1] MODERATE (e.g., interferes with normal activities) pelvic pain AND [2] pain comes and goes (cramps) AND [3] present > 24 hours  Answer Assessment - Initial Assessment Questions Pt has pelvic pain that she has been having since October, intermittently. She states she has not been having regular periods due to new birth control. She states last week pain/pressure/discomfort has been constant with intermittent sharp cramps. She called EMS last night as she was concerned and doesn't see OB until end of February. She states it started on the right side in October but is now bilateral pain, mostly pressure and when her periods are supposed to be. Sharp cramps are 7 when they happen. Denies any vomiting. She states she had some constipation and took miralax  and that has resolved. She states also in November and December she had some weird discharge that was yellowish brown and clumpy. Now its normal white.   She is requesting to stay in her car until NP is ready for her due to agoraphobia.   Also needs insurance updated [employer] community plan  [facility] Member id is [id number] O       1. LOCATION: Where does it hurt?      Bilateral pelvic area 2. RADIATION: Does the pain shoot anywhere else? (e.g., lower back, groin, thighs)     no 3. ONSET: When did the pain begin? (e.g., minutes, hours or days ago)      Intermittent since October, constant the last week with discomfort but shar pains are still intermittent 4. SUDDEN: Gradual or sudden onset?     gradual 5. PATTERN Does the pain come and go, or is it constant?     See above 6. SEVERITY: How bad is the pain?  (e.g., Scale 1-10; mild, moderate, or severe)     7  when sharp 7. RECURRENT SYMPTOM: Have you ever had this type of pelvic pain before? If Yes, ask: When was the last time? and What happened that time?       8. CAUSE: What do you think is causing the pelvic pain?     unknown 9. RELIEVING/AGGRAVATING FACTORS: What makes it better or worse? (e.g., activity/rest, sexual intercourse, voiding, passing stool)      10. OTHER SYMPTOMS: Has there been any other symptoms? (e.g., fever, constipation, diarrhea, urine problems, vaginal bleeding, vaginal discharge, or vomiting?       Denies any current additional symptoms.  11. PREGNANCY: Is there any chance you are pregnant? When was your last menstrual period?       denies  Protocols used: Pelvic Pain - Female-A-AH  "

## 2024-11-07 NOTE — Telephone Encounter (Signed)
 FYI Only or Action Required?: FYI only for provider: appointment scheduled on 1.20.26.  Patient was last seen in primary care on 09/22/2024 by Nche, Roselie Rockford, NP.  Called Nurse Triage reporting Pelvic Pain.  Symptoms began several months ago.  Interventions attempte: miralax  .  Symptoms are: gradually worsening.  Triage Disposition: See Physician Within 24 Hours  Patient/caregiver understands and will follow disposition?: Yes

## 2024-11-08 ENCOUNTER — Encounter: Payer: Self-pay | Admitting: Internal Medicine

## 2024-11-08 ENCOUNTER — Other Ambulatory Visit (HOSPITAL_COMMUNITY)
Admission: RE | Admit: 2024-11-08 | Discharge: 2024-11-08 | Disposition: A | Discharge status: Home / Self Care | Source: Ambulatory Visit | Attending: Internal Medicine | Admitting: Internal Medicine

## 2024-11-08 ENCOUNTER — Ambulatory Visit: Admitting: Internal Medicine

## 2024-11-08 VITALS — BP 133/98 | HR 117 | Temp 98.7°F | Ht 63.0 in | Wt 287.0 lb

## 2024-11-08 DIAGNOSIS — R102 Pelvic and perineal pain unspecified side: Secondary | ICD-10-CM | POA: Diagnosis not present

## 2024-11-08 LAB — POC URINALSYSI DIPSTICK (AUTOMATED)
Bilirubin, UA: NEGATIVE
Blood, UA: NEGATIVE
Glucose, UA: NEGATIVE
Ketones, UA: NEGATIVE
Nitrite, UA: NEGATIVE
Protein, UA: NEGATIVE
Spec Grav, UA: >=1.03 — AB
Urobilinogen, UA: 0.2 U/dL
pH, UA: 6

## 2024-11-08 LAB — POCT URINE PREGNANCY: Preg Test, Ur: NEGATIVE

## 2024-11-08 NOTE — Progress Notes (Signed)
 " [facility] PRIMARY [facility]ARE [facility] [address] [place] [address] Dept: [phone] Dept Fax: [phone]  Acute Care Office Visit  Subjective:   Alison Brock 2001-07-23 11/08/2024  Chief Complaint  Patient presents with   Pelvic Pain    Talked to pcp    HPI:  History of Present Illness   Alison Brock is a 24 year old female with a reported hx of interstitial cystitis who presents with pelvic pain and bloating.  She has been experiencing pelvic pain and bloating since October, initially linked to her menstrual cycle but now persistent. The pain is bilateral in the pelvic area. Initially intermittent, the pain has become more constant, especially when lying on her stomach or needing to urinate.  In August, she had a urinary tract infection treated with antibiotics, followed by a boil in September, also treated with antibiotics. Subsequently, she developed a yeast infection and bacterial vaginosis, which she attributes to antibiotic use. She started a new birth control in November and has not had a period since. She is sexually active. A home pregnancy test was negative.  She also experiences pressure around her bladder and cramping. She reports having some constipation, which has resolved with use of miralax .   No burning or blood in urine.      The following portions of the patient's history were reviewed and updated as appropriate: past medical history, past surgical history, family history, social history, allergies, medications, and problem list.   Patient Active Problem List   Diagnosis Date Noted   Depression 06/03/2024   Psychogenic nonepileptic seizure 06/03/2024   Cyst of subcutaneous tissue 03/07/2024   Left foot pain 03/07/2022   Encounter for long-term current use of medication 11/18/2020   Pyelonephritis 06/02/2019   Migraine variants, not intractable 02/09/2019   Panic disorder 12/25/2018   Social anxiety  disorder 10/06/2018   Frequent headaches 07/08/2016   GAD (generalized anxiety disorder) 06/26/2016   Nonintractable juvenile myoclonic epilepsy without status epilepticus (HCC) 06/26/2016   Seizure disorder (HCC) 06/26/2016   AP (abdominal pain) 06/02/2016   Loss of weight 06/02/2016   Constipation 06/02/2016   Past Medical History:  Diagnosis Date   Allergy    Anxiety    Depression    GERD (gastroesophageal reflux disease)    Seizures (HCC)    Past Surgical History:  Procedure Laterality Date   GUM SURGERY  03/2016   Family History  Problem Relation Age of Onset   Hypertension Mother    Uterine cancer Mother    Arthritis Mother    Cancer Mother    Varicose Veins Mother    Drug abuse Father    Alcohol abuse Father    Cancer Father    Bipolar disorder Maternal Aunt    Anxiety disorder Maternal Aunt    Hypertension Maternal Grandmother    Arthritis Maternal Grandmother    Hypertension Paternal Grandfather    Depression Paternal Grandfather    Cancer Paternal Grandfather    Current Medications[1] Allergies[2]   ROS: A complete ROS was performed with pertinent positives/negatives noted in the HPI. The remainder of the ROS are negative.    Objective:   Today's Vitals   11/08/24 1440 11/08/24 1506  BP: (!) 148/103 (!) 133/98  Pulse: (!) 117   Temp: 98.7 F (37.1 C)   TempSrc: Temporal   SpO2: 99%   Weight: 287 lb (130.2 kg)   Height: 5' 3 (1.6 m)     GENERAL: Well-appearing, in NAD.  Well nourished.  SKIN: Pink, warm and dry. No rash, lesion, ulceration, or ecchymoses.  RESPIRATORY: Chest wall symmetrical. Respirations even and non-labored. Breath sounds clear to auscultation bilaterally.  CARDIAC: S1, S2 present, regular rate and rhythm. Peripheral pulses 2+ bilaterally.  GI: Abdomen soft, mild tenderness with palpation to bilateral groin region. Normoactive bowel sounds. No rebound tenderness. No hepatomegaly or splenomegaly. No CVA tenderness.   EXTREMITIES: Without clubbing, cyanosis, or edema.  PSYCH/MENTAL STATUS: Alert, oriented x 3. Cooperative, intermittent anxious affect.    Results for orders placed or performed in visit on 11/08/24  POCT Urinalysis Dipstick (Automated)  Result Value Ref Range   Color, UA yellow    Clarity, UA clear    Glucose, UA Negative Negative   Bilirubin, UA Negative    Ketones, UA Negative    Spec Grav, UA >=1.030 (A) 1.010 - 1.025   Blood, UA Negative    pH, UA 6.0 5.0 - 8.0   Protein, UA Negative Negative   Urobilinogen, UA 0.2 0.2 or 1.0 E.U./dL   Nitrite, UA Negative    Leukocytes, UA Small (1+) (A) Negative  POCT urine pregnancy  Result Value Ref Range   Preg Test, Ur Negative Negative      Assessment & Plan:   Assessment and Plan    Chronic pelvic pain Intermittent pelvic pain, now persistent, with bloating and pressure. Recent interstitial cystitis flare-up. Negative pregnancy test. Urinalysis shows leukocytes. No nitrates or blood.  - Ordered urine culture to rule out UTI. - Performed vaginal swab for yeast infection, bacterial vaginosis, or STDs. - Ordered pelvic ultrasound  - Advised use of Motrin  and heating pad for symptomatic relief   No orders of the defined types were placed in this encounter.  Orders Placed This Encounter  Procedures   Urine Culture   US  Pelvic Complete With Transvaginal    Standing Status:   Future    Expiration Date:   11/08/2025    Reason for Exam (SYMPTOM  OR DIAGNOSIS REQUIRED):   bilateral pelvic pain    Preferred imaging location?:   [facility]   POCT Urinalysis Dipstick (Automated)   POCT urine pregnancy   Lab Orders         Urine Culture         POCT Urinalysis Dipstick (Automated)         POCT urine pregnancy     No images are attached to the encounter or orders placed in the encounter.  Return if symptoms worsen or fail to improve.   Rosina Senters, FNP     [1]  Current Outpatient Medications:    amitriptyline   (ELAVIL ) 25 MG tablet, TAKE 2 TABLETS BY MOUTH EVERY NIGHT AT BEDTIME, Disp: 60 tablet, Rfl: 5   benzonatate  (TESSALON ) 100 MG capsule, Take 1 capsule (100 mg total) by mouth 3 (three) times daily as needed for cough., Disp: 30 capsule, Rfl: 0   busPIRone  (BUSPAR ) 10 MG tablet, Take 20 mg by mouth 3 (three) times daily., Disp: , Rfl:    fluticasone  (FLONASE ) 50 MCG/ACT nasal spray, Place 2 sprays into both nostrils daily., Disp: 16 g, Rfl: 0   levETIRAcetam  (KEPPRA  XR) 750 MG 24 hr tablet, TAKE 2 TABLETS BY MOUTH EVERY MORNING AND TAKE TWO TABLETS BY MOUTH EVERY NIGHT AT BEDTIME, Disp: 120 tablet, Rfl: 5   LORazepam  (ATIVAN ) 0.5 MG tablet, Take 1/2 tablet 3 times per day for anxiety, Disp: 10 tablet, Rfl: 0   Magnesium  Oxide 500 MG TABS, Take  500 mg by mouth daily. , Disp: , Rfl:    Norethindrone -Ethinyl Estradiol-Fe Biphas (LO LOESTRIN FE ) 1 MG-10 MCG / 10 MCG tablet, Take 1 tablet by mouth daily., Disp: 84 tablet, Rfl: 1   pyridOXINE  (VITAMIN B-6) 100 MG tablet, Take 100 mg by mouth daily., Disp: , Rfl:    riboflavin  (VITAMIN B-2) 100 MG TABS tablet, Take 100 mg by mouth daily., Disp: , Rfl:  [2]  Allergies Allergen Reactions   Sulfamethoxazole-Trimethoprim Nausea And Vomiting   Bentyl  [Dicyclomine  Hcl]    Doxycycline Other (See Comments)    Nausea and hallucination  doxycycline   Grapefruit Extract Other (See Comments)    Pt is not supposed to eat grapefruit with medication    Other     Seasonal Allergies - Spring and Fall   Topiramate  Other (See Comments)    Topamax    Topiramate  Er    "

## 2024-11-08 NOTE — Telephone Encounter (Signed)
 Pt has appointment 11/08/24

## 2024-11-09 LAB — URINE CULTURE
MICRO NUMBER:: 17490895
Result:: NO GROWTH
SPECIMEN QUALITY:: ADEQUATE

## 2024-11-10 ENCOUNTER — Ambulatory Visit: Payer: Self-pay | Admitting: Internal Medicine

## 2024-11-10 DIAGNOSIS — B9689 Other specified bacterial agents as the cause of diseases classified elsewhere: Secondary | ICD-10-CM

## 2024-11-10 LAB — CERVICOVAGINAL ANCILLARY ONLY
Bacterial Vaginitis (gardnerella): POSITIVE — AB
Candida Glabrata: NEGATIVE
Candida Vaginitis: NEGATIVE
Chlamydia: NEGATIVE
Comment: NEGATIVE
Comment: NEGATIVE
Comment: NEGATIVE
Comment: NEGATIVE
Comment: NEGATIVE
Comment: NORMAL
Neisseria Gonorrhea: NEGATIVE
Trichomonas: NEGATIVE

## 2024-11-10 MED ORDER — CLINDAMYCIN PHOSPHATE 2 % VA CREA: 1.0000 | TOPICAL_CREAM | Freq: Every day | VAGINAL | 0 refills | Status: AC

## 2024-11-10 NOTE — Progress Notes (Signed)
 Hi Alison Brock, No UTI present.  Your vaginal swab does show bacterial vaginosis.  I am sending in clindamycin  vaginal cream for you.   Rosina

## 2024-11-17 ENCOUNTER — Ambulatory Visit (HOSPITAL_BASED_OUTPATIENT_CLINIC_OR_DEPARTMENT_OTHER)

## 2024-11-25 ENCOUNTER — Telehealth (INDEPENDENT_AMBULATORY_CARE_PROVIDER_SITE_OTHER): Payer: Self-pay | Admitting: Family

## 2024-11-25 ENCOUNTER — Ambulatory Visit: Payer: Self-pay

## 2024-11-25 ENCOUNTER — Ambulatory Visit (HOSPITAL_BASED_OUTPATIENT_CLINIC_OR_DEPARTMENT_OTHER)

## 2024-11-25 NOTE — Telephone Encounter (Signed)
 FYI Only or Action Required?: Action required by provider: medication question, awaiting further instruction.  Patient was last seen in primary care on 11/08/2024 by Billy Knee, FNP.  Called Nurse Triage reporting Constipation.  Symptoms began about a month ago.  Interventions attempted: OTC medications: miralax .  Symptoms are: stable.  Triage Disposition: Call PCP When Office is Open- await call back  Patient/caregiver understands and will follow disposition?: Yes    Copied from CRM #[id number]. Topic: Clinical - Medical Advice >> Nov 25, 2024  2:40 PM Burnard DEL wrote: Reason for CRM: Patient would like to know if it would be okay for her to skip the last two  brown pills in her birth control pack  due to her having constipation that she thinks is from the constipation.If so ,should she take nothing for those two nights or just start the new pack? Please advise. Reason for Disposition  Taking new prescription medication  Answer Assessment - Initial Assessment Questions 1. STOOL PATTERN OR FREQUENCY: How often do you have a bowel movement (BM)?  (Normal range: 3 times a day to every 3 days)  When was your last BM?       Constipated on/off this month, feels like last 4 pills in pack makes it worse and can also cause diarrhea afterward.   2. STRAINING: Do you have to strain to have a BM?      Sometimes, struggles with first BM in AM, has to strain, feels need to go but struggles, after having first BM then has one or two more;  States first BM is terrible, will have cramping with this.  Also mentions some anxiety stating this is her baseline anxiety and is normal and manageable for her.  Just had BM prior to call.   3. ONSET: When did the constipation begin?     Noticed when starting new birth control pack.    Last 2 brown pills in pack are not hormonal but contain iron instead.  Feels this is directly related.  Did have history of constipation in the past.  Started new medication  in December.  Denies any medical need for iron she is aware of but this was mentioned possibly in high school.   4. RECTAL PAIN: Does your rectum hurt when the stool comes out? If Yes, ask: Do you have hemorrhoids? How bad is the pain?  (Scale 1-10; or mild, moderate, severe)     Denies  5. BM COMPOSITION: Are the stools hard?      Diarrhea and constipation at times.   6. BLOOD ON STOOLS: Has there been any blood on the toilet tissue or on the surface of the BM? If Yes, ask: When was the last time?     Denies- no blood or black stools  7. CHRONIC CONSTIPATION: Is this a new problem for you?  If No, ask: How long have you had this problem? (days, weeks, months)       A past history of.   8. CHANGES IN DIET OR HYDRATION: Have there been any recent changes in your diet? How much fluids are you drinking on a daily basis?  How much have you had to drink today?     Denies  9. MEDICINES: Have you been taking any new medicines? Are you taking any narcotic pain medicines? (e.g., Dilaudid, morphine , Percocet, Vicodin)     Birth control changed;     10. LAXATIVES: Have you been using any stool softeners, laxatives, or enemas?  If Yes,  ask What are you using, how often, and when was the last time?        last used Miralax  3 weeks ago, then had diarrhea so no longer taking.   11. ACTIVITY:  How much walking do you do every day?  Has your activity level decreased in the past week?        active  12. CAUSE: What do you think is causing the constipation?        Iron in birth control, last 4 pills in general.   13. MEDICAL HISTORY: Do you have a history of hemorrhoids, rectal fissures, rectal surgery, or rectal abscess?         History of constipation long time ago.   14. OTHER SYMPTOMS: Do you have any other symptoms? (e.g., abdomen pain, bloating, fever, vomiting)       Denies any diet changes; having some bloating, but mostly around period; also having weird  pelvic pain- has seen Gyn, getting ultraound next week for BV.   The weather has limited availability; denies fever or chills.   15. PREGNANCY: Is there any chance you are pregnant? When was your last menstrual period?       Not bleeding, supposed to have period right now.   16. EXPOSURES:  Reports during call toilet gets clogged easily so will have to flush before able to always visualize stool, reports stool feels normal when passing;  Denies any known water leaks, flooding, or humidity concerns in areas she is in.  No visible mold growth anywhere.  Denies any musty odors or stuffy air.   Has primary care appt. Scheduled for next week.  Protocols used: Constipation-A-AH

## 2024-11-28 ENCOUNTER — Ambulatory Visit: Admitting: Internal Medicine

## 2024-12-02 ENCOUNTER — Ambulatory Visit (HOSPITAL_BASED_OUTPATIENT_CLINIC_OR_DEPARTMENT_OTHER)

## 2024-12-08 ENCOUNTER — Telehealth (INDEPENDENT_AMBULATORY_CARE_PROVIDER_SITE_OTHER): Payer: Self-pay | Admitting: Family
